# Patient Record
Sex: Female | Born: 1995 | Race: Black or African American | Hispanic: No | Marital: Single | Smoking: Current every day smoker
Health system: Southern US, Community
[De-identification: ages and names within clinical notes are randomized; demographics above are authoritative.]

## PROBLEM LIST (undated history)

## (undated) ENCOUNTER — Inpatient Hospital Stay (HOSPITAL_COMMUNITY): Payer: Self-pay

## (undated) VITALS — BP 119/79 | HR 120 | Temp 98.6°F | Resp 14 | Ht 61.22 in | Wt 189.6 lb

## (undated) VITALS — BP 111/71 | HR 105 | Temp 98.5°F | Resp 16 | Ht 61.0 in | Wt 194.0 lb

## (undated) VITALS — BP 125/84 | HR 86 | Temp 98.5°F | Resp 16 | Ht 61.0 in | Wt 186.0 lb

## (undated) DIAGNOSIS — I10 Essential (primary) hypertension: Secondary | ICD-10-CM

## (undated) DIAGNOSIS — Z0000 Encounter for general adult medical examination without abnormal findings: Secondary | ICD-10-CM

## (undated) DIAGNOSIS — R87611 Atypical squamous cells cannot exclude high grade squamous intraepithelial lesion on cytologic smear of cervix (ASC-H): Principal | ICD-10-CM

## (undated) DIAGNOSIS — N871 Moderate cervical dysplasia: Principal | ICD-10-CM

## (undated) DIAGNOSIS — Z6838 Body mass index (BMI) 38.0-38.9, adult: Secondary | ICD-10-CM

## (undated) DIAGNOSIS — F32A Depression, unspecified: Secondary | ICD-10-CM

## (undated) DIAGNOSIS — Z6221 Child in welfare custody: Secondary | ICD-10-CM

## (undated) DIAGNOSIS — T7840XA Allergy, unspecified, initial encounter: Secondary | ICD-10-CM

## (undated) DIAGNOSIS — N390 Urinary tract infection, site not specified: Secondary | ICD-10-CM

## (undated) DIAGNOSIS — F329 Major depressive disorder, single episode, unspecified: Secondary | ICD-10-CM

## (undated) DIAGNOSIS — F419 Anxiety disorder, unspecified: Secondary | ICD-10-CM

## (undated) DIAGNOSIS — F509 Eating disorder, unspecified: Secondary | ICD-10-CM

## (undated) HISTORY — DX: Urinary tract infection, site not specified: N39.0

## (undated) HISTORY — DX: Child in welfare custody: Z62.21

## (undated) HISTORY — DX: Morbid (severe) obesity due to excess calories: E66.01

---
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
## 2014-01-03 ENCOUNTER — Ambulatory Visit: Payer: Medicaid Other | Admitting: Pediatrics

## 2014-01-04 ENCOUNTER — Encounter (HOSPITAL_COMMUNITY): Payer: Self-pay | Admitting: Emergency Medicine

## 2014-01-04 ENCOUNTER — Emergency Department (HOSPITAL_COMMUNITY)
Admission: EM | Admit: 2014-01-04 | Discharge: 2014-01-04 | Disposition: A | Discharge status: Home / Self Care | Payer: Federal, State, Local not specified - PPO | Attending: Emergency Medicine | Admitting: Emergency Medicine

## 2014-01-04 DIAGNOSIS — Z87440 Personal history of urinary (tract) infections: Secondary | ICD-10-CM | POA: Diagnosis not present

## 2014-01-04 DIAGNOSIS — F329 Major depressive disorder, single episode, unspecified: Secondary | ICD-10-CM | POA: Diagnosis not present

## 2014-01-04 DIAGNOSIS — Z79899 Other long term (current) drug therapy: Secondary | ICD-10-CM | POA: Diagnosis not present

## 2014-01-04 DIAGNOSIS — A499 Bacterial infection, unspecified: Secondary | ICD-10-CM | POA: Diagnosis not present

## 2014-01-04 DIAGNOSIS — F17200 Nicotine dependence, unspecified, uncomplicated: Secondary | ICD-10-CM | POA: Insufficient documentation

## 2014-01-04 DIAGNOSIS — R109 Unspecified abdominal pain: Secondary | ICD-10-CM | POA: Diagnosis present

## 2014-01-04 DIAGNOSIS — Z3202 Encounter for pregnancy test, result negative: Secondary | ICD-10-CM | POA: Diagnosis not present

## 2014-01-04 DIAGNOSIS — F3289 Other specified depressive episodes: Secondary | ICD-10-CM | POA: Diagnosis not present

## 2014-01-04 DIAGNOSIS — B9689 Other specified bacterial agents as the cause of diseases classified elsewhere: Secondary | ICD-10-CM | POA: Diagnosis not present

## 2014-01-04 DIAGNOSIS — N760 Acute vaginitis: Secondary | ICD-10-CM | POA: Diagnosis not present

## 2014-01-04 LAB — URINALYSIS, ROUTINE W REFLEX MICROSCOPIC
GLUCOSE, UA: NEGATIVE mg/dL
HGB URINE DIPSTICK: NEGATIVE
Ketones, ur: NEGATIVE mg/dL
Nitrite: NEGATIVE
Protein, ur: NEGATIVE mg/dL
SPECIFIC GRAVITY, URINE: 1.037 — AB (ref 1.005–1.030)
Urobilinogen, UA: 1 mg/dL (ref 0.0–1.0)
pH: 5.5 (ref 5.0–8.0)

## 2014-01-04 LAB — COMPREHENSIVE METABOLIC PANEL
ALT: 19 U/L (ref 0–35)
AST: 22 U/L (ref 0–37)
Albumin: 3.7 g/dL (ref 3.5–5.2)
Alkaline Phosphatase: 87 U/L (ref 39–117)
BUN: 9 mg/dL (ref 6–23)
CALCIUM: 9.7 mg/dL (ref 8.4–10.5)
CO2: 25 mEq/L (ref 19–32)
Chloride: 100 mEq/L (ref 96–112)
Creatinine, Ser: 0.67 mg/dL (ref 0.50–1.10)
GFR calc non Af Amer: >90 mL/min (ref >90)
GLUCOSE: 101 mg/dL — AB (ref 70–99)
Potassium: 4.4 mEq/L (ref 3.7–5.3)
SODIUM: 138 meq/L (ref 137–147)
TOTAL PROTEIN: 8.1 g/dL (ref 6.0–8.3)
Total Bilirubin: 0.3 mg/dL (ref 0.3–1.2)

## 2014-01-04 LAB — CBC WITH DIFFERENTIAL/PLATELET
BASOS PCT: 0 % (ref 0–1)
Basophils Absolute: 0 10*3/uL (ref 0.0–0.1)
EOS PCT: 1 % (ref 0–5)
Eosinophils Absolute: 0.1 10*3/uL (ref 0.0–0.7)
HEMATOCRIT: 41 % (ref 36.0–46.0)
HEMOGLOBIN: 14.6 g/dL (ref 12.0–15.0)
LYMPHS PCT: 29 % (ref 12–46)
Lymphs Abs: 1.7 10*3/uL (ref 0.7–4.0)
MCH: 31.1 pg (ref 26.0–34.0)
MCHC: 35.6 g/dL (ref 30.0–36.0)
MCV: 87.2 fL (ref 78.0–100.0)
MONOS PCT: 6 % (ref 3–12)
Monocytes Absolute: 0.3 10*3/uL (ref 0.1–1.0)
NEUTROS ABS: 3.7 10*3/uL (ref 1.7–7.7)
Neutrophils Relative %: 64 % (ref 43–77)
Platelets: 185 10*3/uL (ref 150–400)
RBC: 4.7 MIL/uL (ref 3.87–5.11)
RDW: 12.7 % (ref 11.5–15.5)
WBC: 5.8 10*3/uL (ref 4.0–10.5)

## 2014-01-04 LAB — WET PREP, GENITAL
Trich, Wet Prep: NONE SEEN
YEAST WET PREP: NONE SEEN

## 2014-01-04 LAB — LIPASE, BLOOD: Lipase: 15 U/L (ref 11–59)

## 2014-01-04 LAB — URINE MICROSCOPIC-ADD ON

## 2014-01-04 MED ORDER — LIDOCAINE HCL (PF) 1 % IJ SOLN
INTRAMUSCULAR | Status: AC
  Administered 2014-01-04: 0.9 mL
  Filled 2014-01-04: qty 5

## 2014-01-04 MED ORDER — CEFTRIAXONE SODIUM 250 MG IJ SOLR
250.0000 mg | Freq: Once | INTRAMUSCULAR | Status: AC
  Administered 2014-01-04: 250 mg via INTRAMUSCULAR
  Filled 2014-01-04: qty 250

## 2014-01-04 MED ORDER — METRONIDAZOLE 500 MG PO TABS: 500.0000 mg | ORAL_TABLET | Freq: Two times a day (BID) | ORAL | Status: DC

## 2014-01-04 MED ORDER — AZITHROMYCIN 250 MG PO TABS
1000.0000 mg | ORAL_TABLET | Freq: Once | ORAL | Status: AC
  Administered 2014-01-04: 1000 mg via ORAL
  Filled 2014-01-04: qty 4

## 2014-01-04 NOTE — Discharge Instructions (Signed)
Flagyl as prescribed.  We will call you if your cultures required further treatment.  Return to the ER for severe abdominal pain, bloody stool, high fever, or other new or concerning symptoms.   Bacterial Vaginosis Bacterial vaginosis is a vaginal infection that occurs when the normal balance of bacteria in the vagina is disrupted. It results from an overgrowth of certain bacteria. This is the most common vaginal infection in women of childbearing age. Treatment is important to prevent complications, especially in pregnant women, as it can cause a premature delivery. CAUSES  Bacterial vaginosis is caused by an increase in harmful bacteria that are normally present in smaller amounts in the vagina. Several different kinds of bacteria can cause bacterial vaginosis. However, the reason that the condition develops is not fully understood. RISK FACTORS Certain activities or behaviors can put you at an increased risk of developing bacterial vaginosis, including:  Having a new sex partner or multiple sex partners.  Douching.  Using an intrauterine device (IUD) for contraception. Women do not get bacterial vaginosis from toilet seats, bedding, swimming pools, or contact with objects around them. SIGNS AND SYMPTOMS  Some women with bacterial vaginosis have no signs or symptoms. Common symptoms include:  Grey vaginal discharge.  A fishlike odor with discharge, especially after sexual intercourse.  Itching or burning of the vagina and vulva.  Burning or pain with urination. DIAGNOSIS  Your health care provider will take a medical history and examine the vagina for signs of bacterial vaginosis. A sample of vaginal fluid may be taken. Your health care provider will look at this sample under a microscope to check for bacteria and abnormal cells. A vaginal pH test may also be done.  TREATMENT  Bacterial vaginosis may be treated with antibiotic medicines. These may be given in the form of a pill or  a vaginal cream. A second round of antibiotics may be prescribed if the condition comes back after treatment.  HOME CARE INSTRUCTIONS   Only take over-the-counter or prescription medicines as directed by your health care provider.  If antibiotic medicine was prescribed, take it as directed. Make sure you finish it even if you start to feel better.  Do not have sex until treatment is completed.  Tell all sexual partners that you have a vaginal infection. They should see their health care provider and be treated if they have problems, such as a mild rash or itching.  Practice safe sex by using condoms and only having one sex partner. SEEK MEDICAL CARE IF:   Your symptoms are not improving after 3 days of treatment.  You have increased discharge or pain.  You have a fever. MAKE SURE YOU:   Understand these instructions.  Will watch your condition.  Will get help right away if you are not doing well or get worse. FOR MORE INFORMATION  Centers for Disease Control and Prevention, Division of STD Prevention: AppraiserFraud.fi American Sexual Health Association (ASHA): www.ashastd.org  Document Released: 10/27/2005 Document Revised: 08/17/2013 Document Reviewed: 06/08/2013 [facility] Patient Information 2014 Kevin.

## 2014-01-04 NOTE — ED Notes (Signed)
C/o abd pain, vomiting, headaches x 3 weeks.

## 2014-01-04 NOTE — ED Provider Notes (Signed)
CSN: [id number]     Arrival date & time 01/04/14  1417 History   First MD Initiated Contact with Patient 01/04/14 1617     Chief Complaint  Patient presents with  . Abdominal Pain     (Consider location/radiation/quality/duration/timing/severity/associated sxs/prior Treatment) HPI Comments: Patient is an 18 year old female with history of depression and UTIs. She presents today with complaints of abdominal discomfort she states is in her epigastric and suprapubic region. This has been coming and going for the past 2 weeks. It occurs several times per day and seems to last an hour or 2. It resolves on its own and there are no alleviating factors. She denies any fevers or chills. She does admit to a vaginal discharge but denies vaginal bleeding. She is sexually active with a new partner over the past month and has not used protection. Her menstrual period is now 2 weeks late.  Patient is a 18 y.o. female presenting with abdominal pain. The history is provided by the patient.  Abdominal Pain Pain location:  Epigastric and suprapubic Pain quality: cramping   Pain severity:  Moderate Onset quality:  Gradual Duration:  2 weeks Timing:  Intermittent Progression:  Worsening Chronicity:  New Relieved by:  Nothing Worsened by:  Nothing tried Ineffective treatments:  None tried Associated symptoms: vaginal discharge   Associated symptoms: no chills, no constipation, no diarrhea, no fever, no hematuria, no vaginal bleeding and no vomiting     Past Medical History  Diagnosis Date  . Depression   . Allergy   . Eating disorder   . UTI (urinary tract infection) 10/03/2013   History reviewed. No pertinent past surgical history. History reviewed. No pertinent family history. History  Substance Use Topics  . Smoking status: Light Tobacco Smoker    Types: Cigarettes  . Smokeless tobacco: Never Used     Comment: states smokes occasionally not daily  . Alcohol Use: No   OB History   Grav  Para Term Preterm Abortions TAB SAB Ect Mult Living                 Review of Systems  Constitutional: Negative for fever and chills.  Gastrointestinal: Positive for abdominal pain. Negative for vomiting, diarrhea and constipation.  Genitourinary: Positive for vaginal discharge. Negative for hematuria and vaginal bleeding.  All other systems reviewed and are negative.      Allergies  Amoxicillin and Tomato  Home Medications   Current Outpatient Rx  Name  Route  Sig  Dispense  Refill  . acetaminophen (TYLENOL) 500 MG tablet   Oral   Take 1,000 mg by mouth every 6 (six) hours as needed for moderate pain.         . ARIPiprazole (ABILIFY) 15 MG tablet   Oral   Take 0.5 tablets (7.5 mg total) by mouth at bedtime.   15 tablet   1   . FLUoxetine (PROZAC) 40 MG capsule   Oral   Take 1 capsule (40 mg total) by mouth daily.   30 capsule   1    BP 123/62  Pulse 74  Temp(Src) 98 F (36.7 C) (Oral)  Resp 18  SpO2 100%  LMP 12/27/2013 Physical Exam  Nursing note and vitals reviewed. Constitutional: She is oriented to person, place, and time. She appears well-developed and well-nourished. No distress.  HENT:  Head: Normocephalic and atraumatic.  Neck: Normal range of motion. Neck supple.  Cardiovascular: Normal rate and regular rhythm.  Exam reveals no gallop and no friction  rub.   No murmur heard. Pulmonary/Chest: Effort normal and breath sounds normal. No respiratory distress. She has no wheezes.  Abdominal: Soft. Bowel sounds are normal. She exhibits no distension and no mass. There is tenderness. There is no rebound and no guarding.  There is tenderness to palpation in the suprapubic region, and to a lesser degree the epigastric region. There is no rebound or guarding.  Musculoskeletal: Normal range of motion.  Neurological: She is alert and oriented to person, place, and time.  Skin: Skin is warm and dry. She is not diaphoretic.    ED Course  Procedures  (including critical care time) Labs Review Labs Reviewed  COMPREHENSIVE METABOLIC PANEL - Abnormal; Notable for the following:    Glucose, Bld 101 (*)    All other components within normal limits  URINALYSIS, ROUTINE W REFLEX MICROSCOPIC - Abnormal; Notable for the following:    Color, Urine AMBER (*)    APPearance CLOUDY (*)    Specific Gravity, Urine 1.037 (*)    Bilirubin Urine SMALL (*)    Leukocytes, UA TRACE (*)    All other components within normal limits  URINE MICROSCOPIC-ADD ON - Abnormal; Notable for the following:    Squamous Epithelial / LPF FEW (*)    Bacteria, UA FEW (*)    All other components within normal limits  WET PREP, GENITAL  GC/CHLAMYDIA PROBE AMP  LIPASE, BLOOD  CBC WITH DIFFERENTIAL  POC URINE PREG, ED   Imaging Review No results found.    MDM   Final diagnoses:  None    Wet prep reveals moderate clue cells and many white blood cells. Will treat as if BV pending GC and Chlamydia cultures. To return as needed if her symptoms worsen or change.    Veryl Speak, MD 01/04/14 [phone]

## 2014-01-05 LAB — GC/CHLAMYDIA PROBE AMP
CT Probe RNA: NEGATIVE
GC Probe RNA: NEGATIVE

## 2014-01-17 ENCOUNTER — Emergency Department (HOSPITAL_COMMUNITY)
Admission: EM | Admit: 2014-01-17 | Discharge: 2014-01-17 | Disposition: A | Discharge status: Other Acute Inpatient Hospital | Payer: Federal, State, Local not specified - PPO | Attending: Emergency Medicine | Admitting: Emergency Medicine

## 2014-01-17 ENCOUNTER — Encounter (HOSPITAL_COMMUNITY): Payer: Self-pay | Admitting: *Deleted

## 2014-01-17 ENCOUNTER — Encounter (HOSPITAL_COMMUNITY): Payer: Self-pay | Admitting: Emergency Medicine

## 2014-01-17 ENCOUNTER — Inpatient Hospital Stay (HOSPITAL_COMMUNITY)
Admission: RE | Admit: 2014-01-17 | Discharge: 2014-01-20 | DRG: 885 | Disposition: A | Discharge status: Home / Self Care | Payer: Federal, State, Local not specified - PPO | Attending: Psychiatry | Admitting: Psychiatry

## 2014-01-17 DIAGNOSIS — F17200 Nicotine dependence, unspecified, uncomplicated: Secondary | ICD-10-CM | POA: Diagnosis not present

## 2014-01-17 DIAGNOSIS — Z880 Allergy status to penicillin: Secondary | ICD-10-CM | POA: Insufficient documentation

## 2014-01-17 DIAGNOSIS — F1010 Alcohol abuse, uncomplicated: Secondary | ICD-10-CM | POA: Diagnosis not present

## 2014-01-17 DIAGNOSIS — R45851 Suicidal ideations: Secondary | ICD-10-CM

## 2014-01-17 DIAGNOSIS — Z87440 Personal history of urinary (tract) infections: Secondary | ICD-10-CM | POA: Diagnosis not present

## 2014-01-17 DIAGNOSIS — F3289 Other specified depressive episodes: Secondary | ICD-10-CM | POA: Insufficient documentation

## 2014-01-17 DIAGNOSIS — F332 Major depressive disorder, recurrent severe without psychotic features: Secondary | ICD-10-CM | POA: Diagnosis present

## 2014-01-17 DIAGNOSIS — Z8669 Personal history of other diseases of the nervous system and sense organs: Secondary | ICD-10-CM | POA: Diagnosis not present

## 2014-01-17 DIAGNOSIS — Z79899 Other long term (current) drug therapy: Secondary | ICD-10-CM | POA: Diagnosis not present

## 2014-01-17 DIAGNOSIS — F909 Attention-deficit hyperactivity disorder, unspecified type: Secondary | ICD-10-CM | POA: Diagnosis present

## 2014-01-17 DIAGNOSIS — F331 Major depressive disorder, recurrent, moderate: Principal | ICD-10-CM | POA: Diagnosis present

## 2014-01-17 DIAGNOSIS — F411 Generalized anxiety disorder: Secondary | ICD-10-CM | POA: Diagnosis present

## 2014-01-17 DIAGNOSIS — G4700 Insomnia, unspecified: Secondary | ICD-10-CM | POA: Diagnosis present

## 2014-01-17 DIAGNOSIS — F329 Major depressive disorder, single episode, unspecified: Secondary | ICD-10-CM | POA: Diagnosis not present

## 2014-01-17 DIAGNOSIS — F39 Unspecified mood [affective] disorder: Secondary | ICD-10-CM | POA: Diagnosis present

## 2014-01-17 DIAGNOSIS — Z008 Encounter for other general examination: Secondary | ICD-10-CM | POA: Diagnosis present

## 2014-01-17 LAB — COMPREHENSIVE METABOLIC PANEL
ALBUMIN: 4.3 g/dL (ref 3.5–5.2)
ALK PHOS: 90 U/L (ref 39–117)
ALT: 17 U/L (ref 0–35)
AST: 21 U/L (ref 0–37)
BILIRUBIN TOTAL: 0.2 mg/dL — AB (ref 0.3–1.2)
BUN: 7 mg/dL (ref 6–23)
CHLORIDE: 101 meq/L (ref 96–112)
CO2: 25 mEq/L (ref 19–32)
Calcium: 9.9 mg/dL (ref 8.4–10.5)
Creatinine, Ser: 0.6 mg/dL (ref 0.50–1.10)
GFR calc Af Amer: >90 mL/min (ref >90)
Glucose, Bld: 92 mg/dL (ref 70–99)
POTASSIUM: 4.2 meq/L (ref 3.7–5.3)
Sodium: 139 mEq/L (ref 137–147)
Total Protein: 8 g/dL (ref 6.0–8.3)

## 2014-01-17 LAB — CBC
HEMATOCRIT: 39.4 % (ref 36.0–46.0)
Hemoglobin: 14 g/dL (ref 12.0–15.0)
MCH: 30.5 pg (ref 26.0–34.0)
MCHC: 35.5 g/dL (ref 30.0–36.0)
MCV: 85.8 fL (ref 78.0–100.0)
PLATELETS: 276 10*3/uL (ref 150–400)
RBC: 4.59 MIL/uL (ref 3.87–5.11)
RDW: 12.6 % (ref 11.5–15.5)
WBC: 6.8 10*3/uL (ref 4.0–10.5)

## 2014-01-17 LAB — ACETAMINOPHEN LEVEL

## 2014-01-17 LAB — SALICYLATE LEVEL

## 2014-01-17 LAB — ETHANOL: Alcohol, Ethyl (B): <11 mg/dL (ref 0–11)

## 2014-01-17 MED ORDER — FLUOXETINE HCL 20 MG PO CAPS
40.0000 mg | ORAL_CAPSULE | Freq: Every day | ORAL | Status: DC
  Administered 2014-01-18 – 2014-01-20 (×3): 40 mg via ORAL
  Filled 2014-01-17 (×6): qty 2

## 2014-01-17 MED ORDER — LORAZEPAM 1 MG PO TABS: 1.0000 mg | ORAL_TABLET | Freq: Three times a day (TID) | ORAL | Status: DC | PRN

## 2014-01-17 MED ORDER — ACETAMINOPHEN 500 MG PO TABS: 1000.0000 mg | ORAL_TABLET | Freq: Four times a day (QID) | ORAL | Status: DC | PRN

## 2014-01-17 MED ORDER — MAGNESIUM HYDROXIDE 400 MG/5ML PO SUSP: 30.0000 mL | Freq: Every day | ORAL | Status: DC | PRN

## 2014-01-17 MED ORDER — ARIPIPRAZOLE 15 MG PO TABS
7.5000 mg | ORAL_TABLET | Freq: Every day | ORAL | Status: DC
  Filled 2014-01-17: qty 1

## 2014-01-17 MED ORDER — FLUOXETINE HCL 20 MG PO CAPS
40.0000 mg | ORAL_CAPSULE | Freq: Every day | ORAL | Status: DC
  Administered 2014-01-17: 40 mg via ORAL
  Filled 2014-01-17: qty 2

## 2014-01-17 MED ORDER — ONDANSETRON HCL 4 MG PO TABS: 4.0000 mg | ORAL_TABLET | Freq: Three times a day (TID) | ORAL | Status: DC | PRN

## 2014-01-17 MED ORDER — IBUPROFEN 200 MG PO TABS: 600.0000 mg | ORAL_TABLET | Freq: Three times a day (TID) | ORAL | Status: DC | PRN

## 2014-01-17 MED ORDER — ALUM & MAG HYDROXIDE-SIMETH 200-200-20 MG/5ML PO SUSP: 30.0000 mL | ORAL | Status: DC | PRN

## 2014-01-17 MED ORDER — NICOTINE 21 MG/24HR TD PT24: 21.0000 mg | MEDICATED_PATCH | Freq: Every day | TRANSDERMAL | Status: DC

## 2014-01-17 MED ORDER — HYDROXYZINE HCL 25 MG PO TABS
25.0000 mg | ORAL_TABLET | Freq: Four times a day (QID) | ORAL | Status: DC | PRN
  Administered 2014-01-17 – 2014-01-19 (×3): 25 mg via ORAL
  Filled 2014-01-17 (×3): qty 1

## 2014-01-17 MED ORDER — ARIPIPRAZOLE 15 MG PO TABS
7.5000 mg | ORAL_TABLET | Freq: Every day | ORAL | Status: DC
  Administered 2014-01-17 – 2014-01-19 (×3): 7.5 mg via ORAL
  Filled 2014-01-17 (×6): qty 1

## 2014-01-17 MED ORDER — ACETAMINOPHEN 325 MG PO TABS: 650.0000 mg | ORAL_TABLET | Freq: Four times a day (QID) | ORAL | Status: DC | PRN

## 2014-01-17 MED ORDER — ZOLPIDEM TARTRATE 5 MG PO TABS: 5.0000 mg | ORAL_TABLET | Freq: Every evening | ORAL | Status: DC | PRN

## 2014-01-17 NOTE — ED Notes (Signed)
No acute distress noted at discharge time.
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
## 2014-01-17 NOTE — Progress Notes (Signed)
Writer informed the Charge Nurse that the patient would be coming to the ER for medical clearance.  Writer informed the TTS Counselor located at [facility])   Patient was assessed at [facility].  Per, Mickel Baas NP the patient meets criteria for inpatient hospitalization for a 500 Hall Bed.  However, no beds at [facility].  Writer contacted [place].

## 2014-01-17 NOTE — Consult Note (Signed)
  Patient was a walk in from [facility]; Patient accepted to [facility] and sent to [facility] for medical clearance.  UDS needs to be collected prior to transfer to [facility].  Patient has been medically cleared.  Patient endorses suicidal ideation with out a plan.  Agree with the TTS assessment for inpatient treatment.    Home medications started.  Monitor for safety and stabilization until patient is transferred to [place] [facility]  Deanna Valencia B. Tylesha Gibeault FNP-BC
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
## 2014-01-17 NOTE — Consult Note (Signed)
Face to face interview and I agree with this note

## 2014-01-17 NOTE — Tx Team (Signed)
Initial Interdisciplinary Treatment Plan  PATIENT STRENGTHS: (choose at least two) Ability for insight Average or above average intelligence Capable of independent living Communication skills General fund of knowledge Motivation for treatment/growth Supportive family/friends  PATIENT STRESSORS: Financial difficulties Medication change or noncompliance Occupational concerns   PROBLEM LIST: Problem List/Patient Goals Date to be addressed Date deferred Reason deferred Estimated date of resolution  Depression      Non-compliant with meds("forget to take them")- has not had med in about 4 weeks      homeless      Risk for self harm                                     DISCHARGE CRITERIA:  Ability to meet basic life and health needs Adequate post-discharge living arrangements Improved stabilization in mood, thinking, and/or behavior Motivation to continue treatment in a less acute level of care Verbal commitment to aftercare and medication compliance  PRELIMINARY DISCHARGE PLAN: Attend aftercare/continuing care group Attend PHP/IOP Outpatient therapy Placement in alternative living arrangements  PATIENT/FAMIILY INVOLVEMENT: This treatment plan has been presented to and reviewed with the patient, Deanna Valencia, and/or family member.  The patient and family have been given the opportunity to ask questions and make suggestions.  Junius Finner [facility] 01/17/2014, 10:48 PM

## 2014-01-17 NOTE — Discharge Instructions (Signed)
Bipolar Disorder Bipolar disorder is a mental illness. The term bipolar disorder actually is used to describe a group of disorders that all share varying degrees of emotional highs and lows that can interfere with daily functioning, such as work, school, or relationships. Bipolar disorder also can lead to drug abuse, hospitalization, and suicide. The emotional highs of bipolar disorder are periods of elation or irritability and high energy. These highs can range from a mild form (hypomania) to a severe form (mania). People experiencing episodes of hypomania may appear energetic, excitable, and highly productive. People experiencing mania may behave impulsively or erratically. They often make poor decisions. They may have difficulty sleeping. The most severe episodes of mania can involve having very distorted beliefs or perceptions about the world and seeing or hearing things that are not real (psychotic delusions and hallucinations).  The emotional lows of bipolar disorder (depression) also can range from mild to severe. Severe episodes of bipolar depression can involve psychotic delusions and hallucinations. Sometimes people with bipolar disorder experience a state of mixed mood. Symptoms of hypomania or mania and depression are both present during this mixed-mood episode. SIGNS AND SYMPTOMS There are signs and symptoms of the episodes of hypomania and mania as well as the episodes of depression. The signs and symptoms of hypomania and mania are similar but vary in severity. They include:  Inflated self-esteem or feeling of increased self-confidence.  Decreased need for sleep.  Unusual talkativeness (rapid or pressured speech) or the feeling of a need to keep talking.  Sensation of racing thoughts or constant talking, with quick shifts between topics that may or may not be related (flight of ideas).  Decreased ability to focus or concentrate.  Increased purposeful activity, such as work, studies,  or social activity, or nonproductive activity, such as pacing, squirming and fidgeting, or finger and toe tapping.  Impulsive behavior and use of poor judgment, resulting in high-risk activities, such as having unprotected sex or spending excessive amounts of money. Signs and symptoms of depression include the following:   Feelings of sadness, hopelessness, or helplessness.  Frequent or uncontrollable episodes of crying.  Lack of feeling anything or caring about anything.  Difficulty sleeping or sleeping too much.  Inability to enjoy the things you used to enjoy.   Desire to be alone all the time.   Feelings of guilt or worthlessness.  Lack of energy or motivation.   Difficulty concentrating, remembering, or making decisions.  Change in appetite or weight beyond normal fluctuations.  Thoughts of death or the desire to harm yourself. DIAGNOSIS  Bipolar disorder is diagnosed through an assessment by your caregiver. Your caregiver will ask questions about your emotional episodes. There are two main types of bipolar disorder. People with type I bipolar disorder have manic episodes with or without depressive episodes. People with type II bipolar disorder have hypomanic episodes and major depressive episodes, which are more serious than mild depression. The type of bipolar disorder you have can make an important difference in how your illness is monitored and treated. Your caregiver may ask questions about your medical history and use of alcohol or drugs, including prescription medication. Certain medical conditions and substances also can cause emotional highs and lows that resemble bipolar disorder (secondary bipolar disorder).  TREATMENT  Bipolar disorder is a long-term illness. It is best controlled with continuous treatment rather than treatment only when symptoms occur. The following treatments can be prescribed for bipolar disorders:  Medication Medication can be prescribed by  a doctor  that is an expert in treating mental disorders (psychiatrists). Medications called mood stabilizers are usually prescribed to help control the illness. Other medications are sometimes added if symptoms of mania, depression, or psychotic delusions and hallucinations occur despite the use of a mood stabilizer.  Talk therapy Some forms of talk therapy are helpful in providing support, education, and guidance. A combination of medication and talk therapy is best for managing the disorder over time. A procedure in which electricity is applied to your brain through your scalp (electroconvulsive therapy) is used in cases of severe mania when medication and talk therapy do not work or work too slowly. Document Released: 02/02/2001 Document Revised: 02/21/2013 Document Reviewed: 11/22/2012 [facility] Patient Information 2014 [place], [place].  Major Depressive Disorder Major depressive disorder (MDD) is a mental illness. It also may be called clinical depression or unipolar depression. MDD usually causes feelings of sadness, hopelessness, or helplessness. Some people with MDD do not feel particularly sad but lose interest in doing things they used to enjoy (anhedonia). MDD also can cause physical symptoms. It can interfere with work, school, relationships, and other normal everyday activities. MDD varies in severity but is longer lasting and more serious than the sadness we all feel from time to time in our lives. MDD often is triggered by stressful life events or major life changes. Examples of these triggers include divorce, loss of your job or home, a move, and the death of a family member or close friend. Sometimes MDD occurs for no obvious reason at all. People who have family members with MDD or bipolar disorder are at higher risk for developing MDD, with or without life stressors. MDD can occur at any age. It may occur just once in your life (single episode MDD). It may occur multiple times (recurrent  MDD). SYMPTOMS People with MDD have either anhedonia or depressed mood on nearly a daily basis for at least 2 weeks or longer. Symptoms of depressed mood include:  Feelings of sadness (blue or down in the dumps) or emptiness.  Feelings of hopelessness or helplessness.  Tearfulness or episodes of crying (may be observed by others).  Irritability (children and adolescents). In addition to depressed mood or anhedonia or both, people with MDD have at least four of the following symptoms:  Difficulty sleeping or sleeping too much.   Significant change (increase or decrease) in appetite or weight.   Lack of energy or motivation.  Feelings of guilt and worthlessness.   Difficulty concentrating, remembering, or making decisions.  Unusually slow movement (psychomotor retardation) or restlessness (as observed by others).   Recurrent wishes for death, recurrent thoughts of self-harm (suicide), or a suicide attempt. People with MDD commonly have persistent negative thoughts about themselves, other people, and the world. People with severe MDD may experiencedistorted beliefs or perceptions about the world (psychotic delusions). They also may see or hear things that are not real (psychotic hallucinations). DIAGNOSIS MDD is diagnosed through an assessment by your caregiver. Your caregiver will ask aboutaspects of your daily life, such as mood,sleep, and appetite, to see if you have the diagnostic symptoms of MDD. Your caregiver may ask about your medical history and use of alcohol or drugs, including prescription medications. Your caregiver also may do a physical exam and blood work. This is because certain medical conditions and the use of certain substances can cause MDD-like symptoms (secondary depression). Your caregiver also may refer you to a mental health specialist for further evaluation and treatment. TREATMENT It is important to recognize  the symptoms of MDD and seek treatment. The  following treatments can be prescribed for MDD:   Medication Antidepressant medications usually are prescribed. Antidepressant medications are thought to correct chemical imbalances in the brain that are commonly associated with MDD. Other types of medication may be added if MDD symptoms do not respond to antidepressant medications alone or if psychotic delusions or hallucinations occur.  Talk therapy Talk therapy can be helpful in treating MDD by providing support, education, and guidance. Certain types of talk therapy also can help with negative thinking (cognitive behavioral therapy) and with relationship issues that trigger MDD (interpersonal therapy). A mental health specialist can help determine which treatment is best for you. Most people with MDD do well with a combination of medication and talk therapy. Treatments involving electrical stimulation of the brain can be used in situations with extremely severe symptoms or when medication and talk therapy do not work over time. These treatments include electroconvulsive therapy, transcranial magnetic stimulation, and vagal nerve stimulation. Document Released: 02/21/2013 Document Reviewed: 02/21/2013 [facility] Patient Information 2014 [place], [place].  Borderline Personality Disorder Borderline personality disorder is a mental health disorder. People with borderline personality disorder have unhealthy patterns of perceiving, thinking about, and reacting to their environment and events in their life. These patterns are established by adolescence or early adulthood. People with borderline personality disorder also have difficulty coping with stress on their own and fear being abandoned by others. They have difficulty controlling their emotions. Their emotions change quickly, frequently, and intensely. They are easily upset and can become very angry, very suddenly. Their unpredictable behavior often leads to problems in their relationships. They often  feel worthless, unloved, and emotionally empty. CAUSES No one knows the exact cause of borderline personality disorder. Most mental health experts think that there is more than one cause. Possible contributing factors include:  Genetic factors. These are traits that are passed down from one generation to the next. Many people with borderline personality disorder have a family history of the disorder.  Physical factors. The part of the brain that controls emotion may be different in people who have borderline personality disorder.  Social factors. Traumatic experiences involving other people may play a role in the development of borderline personality disorder. Examples include neglect, abandonment, and physical and sexual abuse. SYMPTOMS Signs and symptoms of borderline personality disorder include:  A series of unstable personal relationships.  A strong fear of being abandoned and frantic efforts to avoid abandonment.  Impulsive, self-destructive behavior, such as substance abuse, irrational spending of money, unprotected sex with multiple partners, reckless driving, and binge eating.  Poor self-image that also may change a lot, or a sense of identity that is inconsistent.  Recurring self-injury or attempted suicide.  Severe mood swings, including depression, irritability, and anxiety.  Lasting feelings of emptiness.  Difficulty controlling anger.  Temporary feelings of paranoia or loss of touch with reality. DIAGNOSIS A diagnosis of borderline personality disorder requires the presence of at least 5 of the common signs and symptoms. This information is gathered from family and friends as well as medical professionals and legal professionals who have a close association with the patient. This information is also gathered during a psychiatric assessment. During the assessment, the patient is asked about early life experiences, level of education, employment status, physical health  conditions, and current prescription and over-the-counter medicines used. TREATMENT Caregivers who usually treat borderline personality disorder are mental health professionals, such as psychologists, psychiatrists, and clinical social workers. More than one  type of treatment may be needed. Types of treatment include:  Psychotherapy (also known as talk therapy or counseling).  Cognitive behavioral therapy. This helps the person to recognize and change unhealthy feelings, thoughts, and behaviors. They find new, more positive thoughts and actions to replace the old ones.  Dialectical behavioral therapy. Through this type of treatment, a person learns to understand his or her feelings and to regulate them. This may be one-on-one treatment or part of group therapy.  Family therapy. This treatment includes family members.  Medicine. Medicine may be used to help control emotions, reduce reckless and self-destructive behavior, treat anxiety, and treat depression. SEEK IMMEDIATE MEDICAL CARE IF:   You cannot control your behavior or emotions.  You think about hurting yourself.  You think about suicide. FOR MORE INFORMATION National Alliance on Mental Illness: www.nami.[place]: https://carter.com/ [place]: http://bpdresourcecenter.org Document Released: 02/11/2011 Document Revised: 01/19/2012 Document Reviewed: 02/11/2011 [facility] Patient Information 2014 [place], [place].  Depression, Adult Depression is feeling sad, low, down in the dumps, blue, gloomy, or empty. In general, there are two kinds of depression:  Normal sadness or grief. This can happen after something upsetting. It often goes away on its own within 2 weeks. After losing a loved one (bereavement), normal sadness and grief may last longer than two weeks. It usually gets better with time.  Clinical depression. This kind lasts longer than normal sadness or grief. It  keeps you from doing the things you normally do in life. It is often hard to function at home, work, or at school. It may affect your relationships with others. Treatment is often needed. GET HELP RIGHT AWAY IF:  You have thoughts about hurting yourself or others.  You lose touch with reality (psychotic symptoms). You may:  See or hear things that are not real.  Have untrue beliefs about your life or people around you.  Your medicine is giving you problems. MAKE SURE YOU:  Understand these instructions.  Will watch your condition.  Will get help right away if you are not doing well or get worse. Document Released: 11/29/2010 Document Revised: 07/21/2012 Document Reviewed: 02/26/2012 [facility] Patient Information 2014 [place], [place].  Mood Disorders Mood disorders are conditions that affect the way a person feels emotionally. The main mood disorders include:  Depression.  Bipolar disorder.  Dysthymia. Dysthymia is a mild, lasting (chronic) depression. Symptoms of dysthymia are similar to depression, but not as severe.  Cyclothymia. Cyclothymia includes mood swings, but the highs and lows are not as severe as they are in bipolar disorder. Symptoms of cyclothymia are similar to those of bipolar disorder, but less extreme. CAUSES  Mood disorders are probably caused by a combination of factors. People with mood disorders seem to have physical and chemical changes in their brains. Mood disorders run in families, so there may be genetic causes. Severe trauma or stressful life events may also increase the risk of mood disorders.  SYMPTOMS  Symptoms of mood disorders depend on the specific type of condition. Depression symptoms include:  Feeling sad, worthless, or hopeless.  Negative thoughts.  Inability to enjoy one's usual activities.  Low energy.  Sleeping too much or too little.  Appetite changes.  Crying.  Concentration problems.  Thoughts of harming  oneself. Bipolar disorder symptoms include:  Periods of depression (see above symptoms).  Mood swings, from sadness and depression, to abnormal elation and excitement.  Periods of mania:  Racing thoughts.  Fast speech.  Poor  judgment, and careless, dangerous choices.  Decreased need for sleep.  Risky behavior.  Difficulty concentrating.  Irritability.  Increased energy.  Increased sex drive. DIAGNOSIS  There are no blood tests or X-rays that can confirm a mood disorder. However, your caregiver may choose to run some tests to make sure that there is not another physical cause for your symptoms. A mood disorder is usually diagnosed after an in-depth interview with a caregiver. TREATMENT  Mood disorders can be treated with one or more of the following:  Medicine. This may include antidepressants, mood-stabilizers, or anti-psychotics.  Psychotherapy (talk therapy).  Cognitive behavioral therapy. You are taught to recognize negative thoughts and behavior patterns, and replace them with healthy thoughts and behaviors.  Electroconvulsive therapy. For very severe cases of deep depression, a series of treatments in which an electrical current is applied to the brain.  Vagus nerve stimulation. A pulse of electricity is applied to a portion of the brain.  Transcranial magnetic stimulation. Powerful magnets are placed on the head that produce electrical currents.  Hospitalization. In severe situations, or when someone is having serious thoughts of harming him or herself, hospitalization may be necessary in order to keep the person safe. This is also done to quickly start and monitor treatment. HOME CARE INSTRUCTIONS   Take your medicine exactly as directed.  Attend all of your therapy sessions.  Try to eat regular, healthy meals.  Exercise daily. Exercise may improve mood symptoms.  Get good sleep.  Do not drink alcohol or use pot or other drugs. These can worsen mood  symptoms and cause anxiety and psychosis.  Tell your caregiver if you develop any side effects, such as feeling sick to your stomach (nauseous), dry mouth, dizziness, constipation, drowsiness, tremor, weight gain, or sexual symptoms. He or she may suggest things you can do to improve symptoms.  Learn ways to cope with the stress of having a chronic illness. This includes yoga, meditation, tai chi, or participating in a support group.  Drink enough water to keep your urine clear or pale yellow. Eat a high-fiber diet. These habits may help you avoid constipation from your medicine. SEEK IMMEDIATE MEDICAL CARE IF:  Your mood worsens.  You have thoughts of hurting yourself or others.  You cannot care for yourself.  You develop the sensation of hearing or seeing something that is not actually present (auditory or visual hallucinations).  You develop abnormal thoughts. Document Released: 08/24/2009 Document Revised: 01/19/2012 Document Reviewed: 08/24/2009 [facility] Patient Information 2014 [place], [place].

## 2014-01-17 NOTE — Progress Notes (Signed)
Patient referred to [facility] and [facility]/
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
## 2014-01-17 NOTE — ED Notes (Signed)
Pt states that she has had SI "forever" but I have been on my own since Jan 2015 and met her boyfriend in Feb which wasn't good and now pt SI is severe. Pt states that she has tried to OD on pills before and that she could do it again.  Pt brought in by [facility] due to not having any beds.

## 2014-01-18 DIAGNOSIS — R45851 Suicidal ideations: Secondary | ICD-10-CM

## 2014-01-18 LAB — TSH: TSH: 0.412 u[IU]/mL (ref 0.350–4.500)

## 2014-01-18 NOTE — [facility] Group Notes (Signed)
[facility] LCSW Group Therapy  01/18/2014  1:15 PM   Type of Therapy:  Group Therapy  Participation Level:  Active  Participation Quality:  Appropriate, Sharing and Supportive  Affect:  Depressed and Flat  Cognitive:  Alert and Oriented  Insight:  Developing/Improving and Engaged  Engagement in Therapy:  Developing/Improving and Engaged  Modes of Intervention:  Clarification, Confrontation, Discussion, Education, Exploration, Limit-setting, Orientation, Problem-solving, Rapport Building, Art therapist, Socialization and Support  Summary of Progress/Problems: The topic for group today was emotional regulation.  This group focused on both positive and negative emotion identification and allowed group members to process ways to identify feelings, regulate negative emotions, and find healthy ways to manage internal/external emotions. Group members were asked to reflect on a time when their reaction to an emotion led to a negative outcome and explored how alternative responses using emotion regulation would have benefited them. Group members were also asked to discuss a time when emotion regulation was utilized when a negative emotion was experienced.  Pt shared hat she cuts to cope with emotions of anger and sadness, but feels guilty after cutting.  Pt states that she has tried to talk to her mom about why she feels this way but feels the conversation never goes well, ending in arguments and fights.  Pt states that she plans to go to therapy to continue to address her depression.  Pt actively participated and was engaged in group discussion.    Regan Lemming, LCSW 01/18/2014 2:42 PM

## 2014-01-18 NOTE — Progress Notes (Signed)
D: Patient denies SI/HI and A/V hallucinations; patient reports sleep is well; reports appetite is good; reports energy level is normal; reports ability to pay attention is improving; rates depression as 3/10; rates hopelessness 1/10;  A: Monitored q 15 minutes; patient encouraged to attend groups; patient educated about medications; patient given medications per physician orders; patient encouraged to express feelings and/or concerns  R: Patient is flat and blunted; patient is sad; patient is appropriate;patient's interaction with staff and peers is minimal; patient was able to set goal to talk with staff 1:1 when having feelings of SI; patient is taking medications as prescribed and tolerating medications; patient is attending some groups bur forwards little information

## 2014-01-18 NOTE — [facility] Group Notes (Signed)
[facility] LCSW Aftercare Discharge Planning Group Note   01/18/2014 8:45 AM  Participation Quality:  Alert, Appropriate and Oriented  Mood/Affect:  Flat and Depressed  Depression Rating:  3  Anxiety Rating:  6  Thoughts of Suicide:  Pt denies SI/HI  Will you contract for safety?   Yes  Current AVH:  Pt denies  Plan for Discharge/Comments:  Pt attended discharge planning group and actively participated in group.  CSW provided pt with today's workbook.  Pt reports coming to the hospital for SI.  Pt states that she was put out of her student housing on Monday and will have to drop out of her classes at [employer] school.  Pt states that she doesn't know where she will go upon d/c.  Pt states that she has family in [place] but can't stay with them.  CSW will talk with pt about housing options.  Pt states that she can return to [facility] for outpatient medication management and therapy.  No further needs voiced by pt at this time.    Transportation Means: Pt denies access to transportation  Supports: No supports mentioned at this time  Regan Lemming, LCSW 01/18/2014 10:16 AM
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
## 2014-01-18 NOTE — Progress Notes (Signed)
[name]:  (Nursing/MHT/Case Management/Adjunct)  Date:  01/18/2014  Time:  8:00 p.m.   Type of Therapy:  Psychoeducational Skills  Participation Level:  Active  Participation Quality:  Appropriate  Affect:  Appropriate  Cognitive:  Appropriate  Insight:  Appropriate  Engagement in Group:  Engaged  Modes of Intervention:  Education  Summary of Progress/Problems: The patient verbalized in group that she had a good day overall. For one, she indicated that she had a good visit with her father. Secondly, she mentioned that she will be going home on Friday. As a theme for the day, her personal development will involve helping herself before she helps others.    Archie Balboa S 01/18/2014, 10:06 PM

## 2014-01-18 NOTE — Progress Notes (Signed)
Pt reports she has had a good day.  She denies SI/HI/AV.  She says she has attended groups.  She says she has decided to move to [place] to be close to her mother.  She said she was going to [place] to stay with a friend, but that is not going to work out.  She has been on the phone a lot during the day.  She says she is glad to be back on her medications.  Pt makes her needs known to staff.  Support and encouragement offered.  Safety maintained with q15 minute checks.

## 2014-01-18 NOTE — [facility] Suicide Risk Assessment (Signed)
   Nursing information obtained from:  Patient Demographic factors:  Adolescent or young adult;Low socioeconomic status;Living alone;Unemployed Current Mental Status:  Self-harm behaviors Loss Factors:  Financial problems / change in socioeconomic status Historical Factors:  Prior suicide attempts;Family history of mental illness or substance abuse Risk Reduction Factors:  Positive social support;Positive coping skills or problem solving skills Total Time spent with patient: 45 minutes  CLINICAL FACTORS:   Severe Anxiety and/or Agitation Depression:   Anhedonia Hopelessness Impulsivity Insomnia Recent sense of peace/wellbeing Severe More than one psychiatric diagnosis Unstable or Poor Therapeutic Relationship Previous Psychiatric Diagnoses and Treatments Medical Diagnoses and Treatments/Surgeries  Psychiatric Specialty Exam: Physical Exam Full physical performed in Emergency Department. I have reviewed this assessment and concur with its findings.   Review of Systems  Gastrointestinal: Negative.   Psychiatric/Behavioral: Positive for depression and suicidal ideas. The patient is nervous/anxious and has insomnia.   All other systems reviewed and are negative.    Blood pressure 100/68, pulse 71, temperature 97 F (36.1 C), temperature source Oral, resp. rate 18, height 5\' 1"  (1.549 m), weight 84.369 kg (186 lb), last menstrual period 01/09/2014, SpO2 98.00%.Body mass index is 35.16 kg/(m^2).  General Appearance: Guarded  Eye Contact::  Fair  Speech:  Clear and Coherent and Slow  Volume:  Normal  Mood:  Anxious, Depressed, Hopeless, Irritable and Worthless  Affect:  Depressed and Flat  Thought Process:  Goal Directed and Intact  Orientation:  Full (Time, Place, and Person)  Thought Content:  Rumination  Suicidal Thoughts:  Yes.  without intent/plan  Homicidal Thoughts:  No  Memory:  Immediate;   Fair  Judgement:  Impaired  Insight:  Lacking  Psychomotor Activity:   Psychomotor Retardation  Concentration:  Fair  Recall:  [place]  Language: Fair  Akathisia:  NA  Handed:  Right  AIMS (if indicated):     Assets:  Communication Skills Desire for Improvement Leisure Time Physical Health Resilience Social Support  Sleep:  Number of Hours: 6.5   Musculoskeletal: Strength & Muscle Tone: within normal limits Gait & Station: normal Patient leans: N/A  COGNITIVE FEATURES THAT CONTRIBUTE TO RISK:  Closed-mindedness Loss of executive function Polarized thinking Thought constriction (tunnel vision)    SUICIDE RISK:   Moderate:  Frequent suicidal ideation with limited intensity, and duration, some specificity in terms of plans, no associated intent, good self-control, limited dysphoria/symptomatology, some risk factors present, and identifiable protective factors, including available and accessible social support.  PLAN OF CARE: Admit for crisis stabilization, safety monitoring and medication management of major depressive disorder, recurrent with suicidal ideation and in noncompliant with medication. Patient also has multiple psychosocial stressors including being homeless recently  I certify that inpatient services furnished can reasonably be expected to improve the patient's condition.  Hila Bolding,[name] 01/18/2014, 1:06 PM

## 2014-01-18 NOTE — Tx Team (Signed)
Interdisciplinary Treatment Plan Update (Adult)  Date: 01/18/2014  Time Reviewed:  9:45 AM  Progress in Treatment: Attending groups: Yes Participating in groups:  Yes Taking medication as prescribed:  Yes Tolerating medication:  Yes Family/Significant othe contact made: CSW assessing  Patient understands diagnosis:  Yes Discussing patient identified problems/goals with staff:  Yes Medical problems stabilized or resolved:  Yes Denies suicidal/homicidal ideation: Yes Issues/concerns per patient self-inventory:  Yes Other:  New problem(s) identified: N/A  Discharge Plan or Barriers: CSW assessing for appropriate referrals.  Reason for Continuation of Hospitalization: Anxiety Depression Medication Stabilization  Comments: N/A  Estimated length of stay: 3-5 days  For review of initial/current patient goals, please see plan of care.  Attendees: Patient:     Family:     Physician:  Dr. Zorita Pang 01/18/2014 10:40 AM   Nursing:   Para March, RN 01/18/2014 10:40 AM   Clinical Social Worker:  Regan Lemming, LCSW 01/18/2014 10:40 AM   Other: Catalina Pizza, PA 01/18/2014 10:40 AM   Other:  Gala Romney, care coordination 01/18/2014 10:40 AM   Other:  Joette Catching, LCSW 01/18/2014 10:40 AM   Other:  Lawernce Pitts, RN 01/18/2014 10:40 AM   Other: Lars Pinks, RN 01/18/2014 10:40 AM   Other:    Other:    Other:    Other:    Other:     Scribe for Treatment Team:   Ane Payment, 01/18/2014 10:40 AM

## 2014-01-18 NOTE — Progress Notes (Signed)
Adult Psychoeducational Group Note  Date:  01/18/2014 Time:  10:00am Group Topic/Focus:  Personal Choices and Values:   The focus of this group is to help patients assess and explore the importance of values in their lives, how their values affect their decisions, how they express their values and what opposes their expression.  Participation Level:  Active  Participation Quality:  Appropriate and Attentive  Affect:  Appropriate  Cognitive:  Alert and Appropriate  Insight: Appropriate  Engagement in Group:  Engaged  Modes of Intervention:  Discussion and Education  Additional Comments: Pt attended and participated in group. Discussion was on personal development and what it means to you. Pt stated personal development means working on self.   Marlowe Shores D 01/18/2014, 10:57 AM
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
## 2014-01-19 DIAGNOSIS — F332 Major depressive disorder, recurrent severe without psychotic features: Secondary | ICD-10-CM

## 2014-01-19 DIAGNOSIS — F411 Generalized anxiety disorder: Secondary | ICD-10-CM

## 2014-01-19 NOTE — [facility] Suicide Risk Assessment (Signed)
[place] INPATIENT:  Family/Significant Other Suicide Prevention Education  Suicide Prevention Education:  Education Completed; Sibyl Mikula - father [phone]),  (name of family member/significant other) has been identified by the patient as the family member/significant other with whom the patient will be residing, and identified as the person(s) who will aid the patient in the event of a mental health crisis (suicidal ideations/suicide attempt).  With written consent from the patient, the family member/significant other has been provided the following suicide prevention education, prior to the and/or following the discharge of the patient.  The suicide prevention education provided includes the following:  Suicide risk factors  Suicide prevention and interventions  National Suicide Hotline telephone number  [facility] assessment telephone number  [facility] Emergency Assistance [place] and/or Residential Mobile Crisis Unit telephone number  Request made of family/significant other to:  Remove weapons (e.g., guns, rifles, knives), all items previously/currently identified as safety concern.    Remove drugs/medications (over-the-counter, prescriptions, illicit drugs), all items previously/currently identified as a safety concern.  The family member/significant other verbalizes understanding of the suicide prevention education information provided.  The family member/significant other agrees to remove the items of safety concern listed above.  Father is concerned about pt's behaviors outside of the hospital, as she continues to put herself in bad situations.    Ane Payment 01/19/2014, 3:04 PM

## 2014-01-19 NOTE — Progress Notes (Signed)
D: Patient denies SI/HI/AVH. She has a flat affect and depressed mood.  She is attending  and participating in groups.  She is pleasant and appropriate with staff on the unit.  A: Patient given emotional support from RN. Patient encouraged to come to staff with concerns and/or questions. Patient's medication routine continued. Patient's orders and plan of care reviewed.   R: Patient remains appropriate and cooperative. Will continue to monitor patient q15 minutes for safety.

## 2014-01-19 NOTE — Progress Notes (Signed)
Patient ID: Deanna Valencia, female   DOB: 06/01/1996, 18 y.o.   MRN: [id number] D: Patient stated she is doing well. Pt rated depression as 2 and anxiety as 5 on a 0-10 scale. Pt stated waiting to "stay with a friend for a month till she gets a job". Pt denies SI/HI/AVH and pain. Pt attended karaoke and participated. Pt denies any needs or concerns. Cooperative with assessment. No acute distressed noted at this time.   A: Met with pt 1:1. Medications administered as prescribed. Writer encouraged pt to discuss feelings. Pt encouraged to come to staff with any questions or concerns.   R: Patient is safe on the unit. She is complaint with medications and denies any adverse reaction. Continue current POC.

## 2014-01-19 NOTE — [facility] Group Notes (Signed)
[place] LCSW Group Therapy  01/19/2014  1:15 PM   Type of Therapy:  Group Therapy  Participation Level:  Active  Participation Quality:  Attentive, Sharing and Supportive  Affect:  Calm  Cognitive:  Alert and Oriented  Insight:  Developing/Improving and Engaged  Engagement in Therapy:  Developing/Improving and Engaged  Modes of Intervention:  Clarification, Confrontation, Discussion, Education, Exploration, Limit-setting, Orientation, Problem-solving, Rapport Building, Art therapist, Socialization and Support  Summary of Progress/Problems: The topic for group was balance in life.  Today's group focused on defining balance in one's own words, identifying things that can knock one off balance, and exploring healthy ways to maintain balance in life. Group members were asked to provide an example of a time when they felt off balance, describe how they handled that situation,and process healthier ways to regain balance in the future. Group members were asked to share the most important tool for maintaining balance that they learned while at [facility] and how they plan to apply this method after discharge.  Pt shared that she was being too kind to people, which led to her getting evicted from her apartment, now leaving her homeless.  Pt states that she needs to focus more on herself even though she feels selfish doing so.  Pt actively participated and was engaged in group discussion.    Regan Lemming, LCSW 01/19/2014  2:45 PM

## 2014-01-19 NOTE — Progress Notes (Signed)
Adult Psychoeducational Group Note  Date:  01/19/2014 Time:  10:23 AM  Group Topic/Focus:  Self Esteem Action Plan:   The focus of this group is to help patients create a plan to continue to build self-esteem after discharge.  Participation Level:  Active  Participation Quality:  Appropriate, Attentive, Sharing and Supportive  Affect:  Appropriate  Cognitive:  Appropriate and Oriented  Insight: Appropriate  Engagement in Group:  Engaged and Supportive  Modes of Intervention:  Activity, Discussion and Socialization  Additional Comments:  Pt stated on thing she loves most about herself is that she can "turn around any situation and make people laugh".   Deanna Valencia N 01/19/2014, 10:23 AM
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
## 2014-01-19 NOTE — Progress Notes (Signed)
The focus of this group is to educate the patient on the purpose and policies of crisis stabilization and provide a format to answer questions about their admission.  The group details unit policies and expectations of patients while admitted. Pt. Attended group regarding nurse education.  Pt. Actively participated, affect was appropriate.  Pt. Alert with appropriate insight while actively engaging.  Pt. Will work on 3 goals for after their discharge.

## 2014-01-20 DIAGNOSIS — F331 Major depressive disorder, recurrent, moderate: Principal | ICD-10-CM

## 2014-01-20 MED ORDER — ALUM & MAG HYDROXIDE-SIMETH 200-200-20 MG/5ML PO SUSP: 30.0000 mL | ORAL | Status: DC | PRN

## 2014-01-20 MED ORDER — FLUOXETINE HCL 40 MG PO CAPS: 40.0000 mg | ORAL_CAPSULE | Freq: Every day | ORAL | Status: DC

## 2014-01-20 MED ORDER — HYDROXYZINE HCL 25 MG PO TABS: 25.0000 mg | ORAL_TABLET | Freq: Four times a day (QID) | ORAL | Status: DC | PRN

## 2014-01-20 MED ORDER — ARIPIPRAZOLE 15 MG PO TABS: 7.5000 mg | ORAL_TABLET | Freq: Every day | ORAL | Status: DC

## 2014-01-20 NOTE — Progress Notes (Signed)
MD completes DC order and DC SRA. Pt deneid SI, audit, vis and tactile halluc. Pt goven DC AVS, stated she understood these instructions and will comply. SHe completed her morning self inventory and on it she wrote she denies SI within the past 24 hrs, she rates her depression  And hopelessness  "2/2"  And writes her DC plan is to " worry more about ehrslef and less about others". All belongings are returned to pt and she was then escorted to bldg entrance and [place] per poc.

## 2014-01-20 NOTE — [facility] Group Notes (Signed)
[place] LCSW Group Therapy  01/20/2014  1:15 PM   Type of Therapy:  Group Therapy  Participation Level:  Active  Participation Quality:  Attentive, Sharing and Supportive  Affect:  Calm  Cognitive:  Alert and Oriented  Insight:  Developing/Improving and Engaged  Engagement in Therapy:  Developing/Improving and Engaged  Modes of Intervention:  Clarification, Confrontation, Discussion, Education, Exploration, Limit-setting, Orientation, Problem-solving, Rapport Building, Art therapist, Socialization and Support  Summary of Progress/Problems: The topic for today was feelings about relapse.  Pt discussed what relapse prevention is to them and identified triggers that they are on the path to relapse.  Pt processed their feeling towards relapse and was able to relate to peers.  Pt discussed coping skills that can be used for relapse prevention.  Pt states that she relapsed with cutting numerous times but feels she won't ever cut again.  Pt states that her motivation is her little sister.  Pt discussed getting back into therapy to have someone to talk to.  Pt actively participated and was engaged in group discussion.    Regan Lemming, LCSW 01/20/2014  2:35 PM
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
## 2014-01-20 NOTE — [facility] Suicide Risk Assessment (Signed)
   Demographic Factors:  Adolescent or young adult, Low socioeconomic status and Unemployed  Total Time spent with patient: 30 minutes  Psychiatric Specialty Exam: Physical Exam  ROS  Blood pressure 125/84, pulse 86, temperature 98.5 F (36.9 C), temperature source Oral, resp. rate 16, height 5\' 1"  (1.549 m), weight 84.369 kg (186 lb), last menstrual period 01/09/2014, SpO2 98.00%.Body mass index is 35.16 kg/(m^2).  General Appearance: Casual and Fairly Groomed  Eye Contact::  Good  Speech:  Clear and Coherent  Volume:  Normal  Mood:  Anxious and Euthymic  Affect:  Appropriate and Congruent  Thought Process:  Goal Directed, Intact and Logical  Orientation:  Full (Time, Place, and Person)  Thought Content:  WDL  Suicidal Thoughts:  No  Homicidal Thoughts:  No  Memory:  Immediate;   Good  Judgement:  Intact  Insight:  Fair  Psychomotor Activity:  Normal  Concentration:  Good  Recall:  Good  Fund of Knowledge:Good  Language: Good  Akathisia:  NA  Handed:  Right  AIMS (if indicated):     Assets:  Communication Skills Desire for Improvement Leisure Time Physical Health Resilience Social Support Talents/Skills Transportation Vocational/Educational  Sleep:  Number of Hours: 6    Musculoskeletal: Strength & Muscle Tone: within normal limits Gait & Station: normal Patient leans: N/A   Mental Status Per Nursing Assessment::   On Admission:  Self-harm behaviors  Loss Factors: Financial problems/change in socioeconomic status  Historical Factors: Family history of mental illness or substance abuse and Impulsivity  Risk Reduction Factors:   Sense of responsibility to family, Religious beliefs about death, Living with another person, especially a relative, Positive social support, Positive therapeutic relationship and Positive coping skills or problem solving skills  Continued Clinical Symptoms:  Depression:   Recent sense of peace/wellbeing Previous Psychiatric  Diagnoses and Treatments  Cognitive Features That Contribute To Risk:  Polarized thinking    Suicide Risk:  Minimal: No identifiable suicidal ideation.  Patients presenting with no risk factors but with morbid ruminations; may be classified as minimal risk based on the severity of the depressive symptoms  Discharge Diagnoses:   AXIS I:  Generalized Anxiety Disorder and Major Depression, Recurrent severe AXIS II:  Deferred AXIS III:   Past Medical History  Diagnosis Date  . Depression   . Allergy   . Eating disorder   . UTI (urinary tract infection) 10/03/2013   AXIS IV:  economic problems, housing problems, other psychosocial or environmental problems, problems related to social environment and problems with primary support group AXIS V:  61-70 mild symptoms  Plan Of Care/Follow-up recommendations:  Activity:  As tolerated Diet:  Regular  Is patient on multiple antipsychotic therapies at discharge:  No   Has Patient had three or more failed trials of antipsychotic monotherapy by history:  No  Recommended Plan for Multiple Antipsychotic Therapies: NA    Deanna Valencia,Deanna Valencia. 01/20/2014, 11:21 AM

## 2014-01-20 NOTE — [facility] Group Notes (Signed)
[facility] LCSW Aftercare Discharge Planning Group Note   01/20/2014 8:45 AM  Participation Quality:  Alert, Appropriate and Oriented  Mood/Affect:  Calm  Depression Rating:  2  Anxiety Rating:  2  Thoughts of Suicide:  Pt denies SI/HI  Will you contract for safety?   Yes  Current AVH:  Pt denies  Plan for Discharge/Comments:  Pt attended discharge planning group and actively participated in group.  CSW provided pt with today's workbook. Pt states that she is doing great today and ready to d/c.  Pt was kicked out of her housing prior to admitting to the hospital and plans to stay with friends or at the shelter in [place] before staying with a friend in [place] next week.  Pt requested information on [facility] yesterday and reports calling and setting up an interview with them on Monday at 10:00 am.  Pt states that she can return to [facility] for outpatient medication management and therapy.  No further needs voiced by pt at this time.    Transportation Means: Pt reports access to transportation -  Friend will pick pt up  Supports: No supports mentioned at this time  Regan Lemming, LCSW 01/20/2014 10:21 AM

## 2014-01-20 NOTE — Discharge Instructions (Signed)
Major Depressive Disorder °Major depressive disorder (MDD) is a mental illness. It also may be called clinical depression or unipolar depression. MDD usually causes feelings of sadness, hopelessness, or helplessness. Some people with MDD do not feel particularly sad but lose interest in doing things they used to enjoy (anhedonia). MDD also can cause physical symptoms. It can interfere with work, school, relationships, and other normal everyday activities. MDD varies in severity but is longer lasting and more serious than the sadness we all feel from time to time in our lives. °MDD often is triggered by stressful life events or major life changes. Examples of these triggers include divorce, loss of your job or home, a move, and the death of a family member or close friend. Sometimes MDD occurs for no obvious reason at all. People who have family members with MDD or bipolar disorder are at higher risk for developing MDD, with or without life stressors. MDD can occur at any age. It may occur just once in your life (single episode MDD). It may occur multiple times (recurrent MDD). °SYMPTOMS °People with MDD have either anhedonia or depressed mood on nearly a daily basis for at least 2 weeks or longer. Symptoms of depressed mood include: °· Feelings of sadness (blue or down in the dumps) or emptiness. °· Feelings of hopelessness or helplessness. °· Tearfulness or episodes of crying (may be observed by others). °· Irritability (children and adolescents). °In addition to depressed mood or anhedonia or both, people with MDD have at least four of the following symptoms: °· Difficulty sleeping or sleeping too much.   °· Significant change (increase or decrease) in appetite or weight.   °· Lack of energy or motivation. °· Feelings of guilt and worthlessness.   °· Difficulty concentrating, remembering, or making decisions. °· Unusually slow movement (psychomotor retardation) or restlessness (as observed by others).    °· Recurrent wishes for death, recurrent thoughts of self-harm (suicide), or a suicide attempt. °People with MDD commonly have persistent negative thoughts about themselves, other people, and the world. People with severe MDD may experience distorted beliefs or perceptions about the world (psychotic delusions). They also may see or hear things that are not real (psychotic hallucinations). °DIAGNOSIS °MDD is diagnosed through an assessment by your caregiver. Your caregiver will ask about aspects of your daily life, such as mood, sleep, and appetite, to see if you have the diagnostic symptoms of MDD. Your caregiver may ask about your medical history and use of alcohol or drugs, including prescription medications. Your caregiver also may do a physical exam and blood work. This is because certain medical conditions and the use of certain substances can cause MDD-like symptoms (secondary depression). Your caregiver also may refer you to a mental health specialist for further evaluation and treatment. °TREATMENT °It is important to recognize the symptoms of MDD and seek treatment. The following treatments can be prescribed for MDD:   °· Medication Antidepressant medications usually are prescribed. Antidepressant medications are thought to correct chemical imbalances in the brain that are commonly associated with MDD. Other types of medication may be added if MDD symptoms do not respond to antidepressant medications alone or if psychotic delusions or hallucinations occur. °· Talk therapy Talk therapy can be helpful in treating MDD by providing support, education, and guidance. Certain types of talk therapy also can help with negative thinking (cognitive behavioral therapy) and with relationship issues that trigger MDD (interpersonal therapy). °A mental health specialist can help determine which treatment is best for you. Most people with MDD do well with a   combination of medication and talk therapy. Treatments involving  electrical stimulation of the brain can be used in situations with extremely severe symptoms or when medication and talk therapy do not work over time. These treatments include electroconvulsive therapy, transcranial magnetic stimulation, and vagal nerve stimulation. °Document Released: 02/21/2013 Document Reviewed: 02/21/2013 °Exit[employer]® Patient Information ©2014 [employer]. ° °

## 2014-01-20 NOTE — Progress Notes (Signed)
[facility] Adult Case Management Discharge Plan :  Will you be returning to the same living situation after discharge: Yes,  pt was homeless prior to admission and will continue to be homeless at discharge.  Pt verbalizes a plan to stay at a shelter or with friends in [place] At discharge, do you have transportation home?:Yes,  friend will pick pt up Do you have the ability to pay for your medications:Yes,  provided pt with samples and prescriptions and referred pt to [facility] for assistance with affording meds  Release of information consent forms completed and in the chart;  Patient's signature needed at discharge.  Patient to Follow up at: Follow-up Information   Follow up with [facility] On 01/24/2014. (Walk in on this date for hospital discharge appointment. Walk in clinic is Monday - Friday 8 am - 3 pm. They will than schedule you for medication management and therapy)    Contact information:   [address], [place] [address] Phone: [phone] Fax: [phone]      Patient denies SI/HI:   Yes,  denies SI/Hi    Safety Planning and Suicide Prevention discussed:  Yes,  discussed with pt and pt's father.  See suicide prevention education note.   Ane Payment 01/20/2014, 10:20 AM

## 2014-01-20 NOTE — Tx Team (Signed)
Interdisciplinary Treatment Plan Update (Adult)  Date: 01/20/2014  Time Reviewed:  9:45 AM  Progress in Treatment: Attending groups: Yes Participating in groups:  Yes Taking medication as prescribed:  Yes Tolerating medication:  Yes Family/Significant othe contact made: Yes, with pt's father Patient understands diagnosis:  Yes Discussing patient identified problems/goals with staff:  Yes Medical problems stabilized or resolved:  Yes Denies suicidal/homicidal ideation: Yes Issues/concerns per patient self-inventory:  Yes Other:  New problem(s) identified: N/A  Discharge Plan or Barriers: Pt will follow up at [facility] for outpatient medication management and therapy.    Reason for Continuation of Hospitalization: Stable to d/c today  Comments: N/A  Estimated length of stay: D/C today  For review of initial/current patient goals, please see plan of care.  Attendees: Patient:  Deanna Valencia 01/20/2014 10:17 AM   Family:     Physician:  Dr. Zorita Pang 01/20/2014 10:17 AM   Nursing:   Drake Leach, RN 01/20/2014 10:17 AM   Clinical Social Worker:  Regan Lemming, LCSW 01/20/2014 10:17 AM   Other: Lars Pinks, case manager 01/20/2014 10:17 AM   Other:  Norberto Sorenson, care coordination 01/20/2014 10:17 AM   Other:  Joette Catching, LCSW 01/20/2014 10:17 AM   Other:     Other:    Other:    Other:    Other:    Other:      Scribe for Treatment Team:   Ane Payment, 01/20/2014 , 10:17 AM

## 2014-01-25 NOTE — Progress Notes (Signed)
Patient Discharge Instructions:  After Visit Summary (AVS):   Faxed to:  01/25/14 Discharge Summary Note:   Faxed to:  01/25/14 Psychiatric Admission Assessment Note:   Faxed to:  01/25/14 Suicide Risk Assessment - Discharge Assessment:   Faxed to:  01/25/14 Faxed/Sent to the Next Level Care provider:  01/25/14 Faxed to [facility] [place], 01/25/2014, 4:32 PM

## 2014-03-23 ENCOUNTER — Ambulatory Visit: Payer: Self-pay | Admitting: Pediatrics

## 2014-03-29 ENCOUNTER — Encounter (HOSPITAL_COMMUNITY): Payer: Self-pay | Admitting: Emergency Medicine

## 2014-03-29 ENCOUNTER — Emergency Department (HOSPITAL_COMMUNITY)
Admission: EM | Admit: 2014-03-29 | Discharge: 2014-03-29 | Disposition: A | Discharge status: Other Acute Inpatient Hospital | Payer: Federal, State, Local not specified - PPO | Attending: Emergency Medicine | Admitting: Emergency Medicine

## 2014-03-29 ENCOUNTER — Inpatient Hospital Stay (HOSPITAL_COMMUNITY)
Admission: AD | Admit: 2014-03-29 | Discharge: 2014-03-31 | DRG: 885 | Disposition: A | Discharge status: Home / Self Care | Payer: Federal, State, Local not specified - PPO | Source: Intra-hospital | Attending: Psychiatry | Admitting: Psychiatry

## 2014-03-29 ENCOUNTER — Ambulatory Visit (HOSPITAL_COMMUNITY)
Admission: RE | Admit: 2014-03-29 | Discharge: 2014-03-29 | Disposition: A | Discharge status: Home / Self Care | Payer: Federal, State, Local not specified - PPO | Attending: Psychiatry | Admitting: Psychiatry

## 2014-03-29 ENCOUNTER — Encounter (HOSPITAL_COMMUNITY): Payer: Self-pay | Admitting: *Deleted

## 2014-03-29 DIAGNOSIS — F4321 Adjustment disorder with depressed mood: Secondary | ICD-10-CM

## 2014-03-29 DIAGNOSIS — Z5900 Homelessness unspecified: Secondary | ICD-10-CM | POA: Insufficient documentation

## 2014-03-29 DIAGNOSIS — F332 Major depressive disorder, recurrent severe without psychotic features: Secondary | ICD-10-CM | POA: Insufficient documentation

## 2014-03-29 DIAGNOSIS — R45850 Homicidal ideations: Secondary | ICD-10-CM

## 2014-03-29 DIAGNOSIS — Z880 Allergy status to penicillin: Secondary | ICD-10-CM | POA: Insufficient documentation

## 2014-03-29 DIAGNOSIS — F17200 Nicotine dependence, unspecified, uncomplicated: Secondary | ICD-10-CM | POA: Diagnosis not present

## 2014-03-29 DIAGNOSIS — R45851 Suicidal ideations: Secondary | ICD-10-CM | POA: Insufficient documentation

## 2014-03-29 DIAGNOSIS — Z87440 Personal history of urinary (tract) infections: Secondary | ICD-10-CM | POA: Diagnosis not present

## 2014-03-29 DIAGNOSIS — Z3202 Encounter for pregnancy test, result negative: Secondary | ICD-10-CM | POA: Insufficient documentation

## 2014-03-29 DIAGNOSIS — Z008 Encounter for other general examination: Secondary | ICD-10-CM | POA: Diagnosis present

## 2014-03-29 DIAGNOSIS — F411 Generalized anxiety disorder: Secondary | ICD-10-CM

## 2014-03-29 DIAGNOSIS — F3289 Other specified depressive episodes: Secondary | ICD-10-CM | POA: Diagnosis not present

## 2014-03-29 DIAGNOSIS — Z8669 Personal history of other diseases of the nervous system and sense organs: Secondary | ICD-10-CM | POA: Diagnosis not present

## 2014-03-29 DIAGNOSIS — F1210 Cannabis abuse, uncomplicated: Secondary | ICD-10-CM | POA: Diagnosis present

## 2014-03-29 DIAGNOSIS — F329 Major depressive disorder, single episode, unspecified: Secondary | ICD-10-CM | POA: Diagnosis not present

## 2014-03-29 DIAGNOSIS — Z79899 Other long term (current) drug therapy: Secondary | ICD-10-CM | POA: Diagnosis not present

## 2014-03-29 LAB — COMPREHENSIVE METABOLIC PANEL
ALT: 15 U/L (ref 0–35)
AST: 18 U/L (ref 0–37)
Albumin: 3.9 g/dL (ref 3.5–5.2)
Alkaline Phosphatase: 83 U/L (ref 39–117)
BUN: 8 mg/dL (ref 6–23)
CALCIUM: 9.1 mg/dL (ref 8.4–10.5)
CHLORIDE: 103 meq/L (ref 96–112)
CO2: 24 meq/L (ref 19–32)
Creatinine, Ser: 0.67 mg/dL (ref 0.50–1.10)
GFR calc Af Amer: >90 mL/min (ref >90)
GFR calc non Af Amer: >90 mL/min (ref >90)
Glucose, Bld: 92 mg/dL (ref 70–99)
Potassium: 3.9 mEq/L (ref 3.7–5.3)
SODIUM: 140 meq/L (ref 137–147)
Total Protein: 7.5 g/dL (ref 6.0–8.3)

## 2014-03-29 LAB — CBC
HCT: 34.2 % — ABNORMAL LOW (ref 36.0–46.0)
HEMOGLOBIN: 12.1 g/dL (ref 12.0–15.0)
MCH: 30.4 pg (ref 26.0–34.0)
MCHC: 35.4 g/dL (ref 30.0–36.0)
MCV: 85.9 fL (ref 78.0–100.0)
Platelets: 244 10*3/uL (ref 150–400)
RBC: 3.98 MIL/uL (ref 3.87–5.11)
RDW: 12.5 % (ref 11.5–15.5)
WBC: 7.1 10*3/uL (ref 4.0–10.5)

## 2014-03-29 LAB — RAPID URINE DRUG SCREEN, HOSP PERFORMED
AMPHETAMINES: NOT DETECTED
Barbiturates: NOT DETECTED
Benzodiazepines: NOT DETECTED
Cocaine: NOT DETECTED
OPIATES: NOT DETECTED
TETRAHYDROCANNABINOL: POSITIVE — AB

## 2014-03-29 LAB — ACETAMINOPHEN LEVEL

## 2014-03-29 LAB — ETHANOL: Alcohol, Ethyl (B): <11 mg/dL (ref 0–11)

## 2014-03-29 LAB — SALICYLATE LEVEL: Salicylate Lvl: <2 mg/dL — ABNORMAL LOW (ref 2.8–20.0)

## 2014-03-29 LAB — PREGNANCY, URINE: Preg Test, Ur: NEGATIVE

## 2014-03-29 MED ORDER — LORAZEPAM 1 MG PO TABS: 1.0000 mg | ORAL_TABLET | Freq: Three times a day (TID) | ORAL | Status: DC | PRN

## 2014-03-29 MED ORDER — ALUM & MAG HYDROXIDE-SIMETH 200-200-20 MG/5ML PO SUSP: 30.0000 mL | ORAL | Status: DC | PRN

## 2014-03-29 MED ORDER — NICOTINE 21 MG/24HR TD PT24
21.0000 mg | MEDICATED_PATCH | Freq: Every day | TRANSDERMAL | Status: DC
  Filled 2014-03-29 (×4): qty 1

## 2014-03-29 MED ORDER — ACETAMINOPHEN 325 MG PO TABS: 650.0000 mg | ORAL_TABLET | Freq: Four times a day (QID) | ORAL | Status: DC | PRN

## 2014-03-29 MED ORDER — ONDANSETRON HCL 4 MG PO TABS: 4.0000 mg | ORAL_TABLET | Freq: Three times a day (TID) | ORAL | Status: DC | PRN

## 2014-03-29 MED ORDER — ACETAMINOPHEN 325 MG PO TABS: 650.0000 mg | ORAL_TABLET | ORAL | Status: DC | PRN

## 2014-03-29 MED ORDER — FLUOXETINE HCL 20 MG PO CAPS
40.0000 mg | ORAL_CAPSULE | Freq: Every day | ORAL | Status: DC
  Administered 2014-03-30 – 2014-03-31 (×2): 40 mg via ORAL
  Filled 2014-03-29 (×5): qty 2

## 2014-03-29 MED ORDER — IBUPROFEN 200 MG PO TABS: 600.0000 mg | ORAL_TABLET | Freq: Three times a day (TID) | ORAL | Status: DC | PRN

## 2014-03-29 MED ORDER — ARIPIPRAZOLE 15 MG PO TABS
7.5000 mg | ORAL_TABLET | Freq: Every day | ORAL | Status: DC
  Administered 2014-03-29: 15 mg via ORAL
  Administered 2014-03-30: 22:00:00 via ORAL
  Filled 2014-03-29 (×5): qty 1

## 2014-03-29 MED ORDER — MAGNESIUM HYDROXIDE 400 MG/5ML PO SUSP: 30.0000 mL | Freq: Every day | ORAL | Status: DC | PRN

## 2014-03-29 MED ORDER — ZOLPIDEM TARTRATE 5 MG PO TABS: 5.0000 mg | ORAL_TABLET | Freq: Every evening | ORAL | Status: DC | PRN

## 2014-03-29 NOTE — ED Notes (Signed)
Patient discharge via ambulatory with a steady gait. No acute distress noted. Patient denies SI, HI and AVH at this time.
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
## 2014-03-29 NOTE — Tx Team (Signed)
Initial Interdisciplinary Treatment Plan  PATIENT STRENGTHS: (choose at least two) Ability for insight Active sense of humor Average or above average intelligence Capable of independent living Communication skills General fund of knowledge Motivation for treatment/growth Physical Health  PATIENT STRESSORS: Financial difficulties Marital or family conflict Medication change or noncompliance Substance abuse   PROBLEM LIST: Problem List/Patient Goals Date to be addressed Date deferred Reason deferred Estimated date of resolution  "I wanted to get some medicine again" 03/29/14     Increased risk for suicide 03/29/14     Learn to use coping skills 03/29/14                                          DISCHARGE CRITERIA:  Ability to meet basic life and health needs Adequate post-discharge living arrangements Improved stabilization in mood, thinking, and/or behavior Medical problems require only outpatient monitoring Motivation to continue treatment in a less acute level of care Need for constant or close observation no longer present Reduction of life-threatening or endangering symptoms to within safe limits Safe-care adequate arrangements made Verbal commitment to aftercare and medication compliance  PRELIMINARY DISCHARGE PLAN: Outpatient therapy Return to previous living arrangement  PATIENT/FAMIILY INVOLVEMENT: This treatment plan has been presented to and reviewed with the patient, Deanna Valencia, and/or family member.  The patient and family have been given the opportunity to ask questions and make suggestions.  Doran Heater 03/29/2014, 10:39 PM

## 2014-03-29 NOTE — ED Notes (Signed)
MD at bedside. 

## 2014-03-29 NOTE — BH Assessment (Signed)
Clinician consulted with Patriciaann Clan, PA who states Pt meets criteria for inpatient admission. Katrine Coho, [facility] reported no bed availability at [facility]. Pt can return to [facility] 500 bed when available. Clinician contacted Charge Nurse Tiffany to inform of pt arrival. Pt contacted [facility] for transport to [facility] for medical clearance.   Fredonia Highland, MSW, LCSW Triage Specialist [phone]

## 2014-03-29 NOTE — ED Notes (Addendum)
Pt has 4 dresses, 1 pr of pants, 2 pr of shorts, shirts, underwear, sandals, a brush and 1 jacket.  Seen and wanded by security.  Pt belongings are in locker 53.

## 2014-03-29 NOTE — BH Assessment (Signed)
D/C per Dr. Lovena Le. Writer will provide patient with a list of follow up referrals prior to discharge.
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
## 2014-03-29 NOTE — ED Provider Notes (Signed)
Patient accepted at [facility] under Dr. Lamar Benes for suicidal ideation. Stable for transfer.   Wandra Arthurs, MD 03/29/14 2039

## 2014-03-29 NOTE — ED Notes (Signed)
Family member Patt Steinhardt called and said to tell patient front desk said he could not leave her clothes he had to take them with him, because he has a big garbage bag full of clothes. He said she will have to find a ride and come and pick her clothes up. I asked where were the clothes he said she knows where they are at.
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
## 2014-03-30 DIAGNOSIS — R45851 Suicidal ideations: Secondary | ICD-10-CM

## 2014-03-30 DIAGNOSIS — F1210 Cannabis abuse, uncomplicated: Secondary | ICD-10-CM

## 2014-03-30 DIAGNOSIS — F332 Major depressive disorder, recurrent severe without psychotic features: Principal | ICD-10-CM

## 2014-03-30 DIAGNOSIS — F411 Generalized anxiety disorder: Secondary | ICD-10-CM

## 2014-03-30 LAB — URINALYSIS, ROUTINE W REFLEX MICROSCOPIC
Bilirubin Urine: NEGATIVE
Glucose, UA: NEGATIVE mg/dL
Hgb urine dipstick: NEGATIVE
Ketones, ur: NEGATIVE mg/dL
NITRITE: NEGATIVE
PROTEIN: NEGATIVE mg/dL
Specific Gravity, Urine: 1.009 (ref 1.005–1.030)
UROBILINOGEN UA: 0.2 mg/dL (ref 0.0–1.0)
pH: 7.5 (ref 5.0–8.0)

## 2014-03-30 LAB — URINE MICROSCOPIC-ADD ON

## 2014-03-30 MED ORDER — HYDROXYZINE HCL 25 MG PO TABS: 25.0000 mg | ORAL_TABLET | Freq: Four times a day (QID) | ORAL | Status: DC | PRN
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
## 2014-03-30 NOTE — Progress Notes (Signed)
D: Writer observed pt in bed resting with eyes closed. Pt was easily aroused.  A: Writer introduced self to pt. Writer administered pt's first dose of Abilify at [facility]. Continued support and availability as needed was extended to this pt. Staff continue to monitor pt with q39min checks. R: Pt voices familiarity with administered drug. No adverse drug reactions noted. Pt receptive to treatment. Pt remains safe at this time.
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
## 2014-03-30 NOTE — Progress Notes (Signed)
Patient ID: Deanna Valencia, female   DOB: 04/04/1996, 18 y.o.   MRN: [id number]  D: Pt. Denies SI/HI and A/V Hallucinations. Patient rates her depression and hopelessness at 1/10 for the day. Patient reports that she slept well and her appetite is good. Patient does not report any pain or discomfort at this time. Patient reports that when she goes home she will use her coping skills.  A: Support and encouragement provided to the patient. Scheduled medication administered to patient per physician's orders.  R: Patient is receptive and cooperative but minimal. Patient is seen in the milieu. Q15 minute checks are maintained for safety.

## 2014-03-30 NOTE — Progress Notes (Signed)
Patient ID: Deanna Valencia, female   DOB: 09/15/1996, 18 y.o.   MRN: [id number]  [facility] 9 AM  The focus of this group is to educate the patient on the purpose and policies of crisis stabilization and provide a format to answer questions about their admission.  The group details unit policies and expectations of patients while admitted.  Patient did not attend group.
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
## 2014-03-31 LAB — GC/CHLAMYDIA PROBE AMP
CT PROBE, AMP APTIMA: POSITIVE — AB
GC PROBE AMP APTIMA: NEGATIVE

## 2014-03-31 LAB — RPR

## 2014-03-31 MED ORDER — AZITHROMYCIN 500 MG PO TABS
1000.0000 mg | ORAL_TABLET | Freq: Every day | ORAL | Status: AC
  Administered 2014-03-31: 1000 mg via ORAL
  Filled 2014-03-31: qty 2

## 2014-03-31 MED ORDER — FLUOXETINE HCL 40 MG PO CAPS: 40.0000 mg | ORAL_CAPSULE | Freq: Every day | ORAL | Status: DC

## 2014-03-31 MED ORDER — ARIPIPRAZOLE 15 MG PO TABS: 7.5000 mg | ORAL_TABLET | Freq: Every day | ORAL | Status: DC

## 2014-03-31 MED ORDER — HYDROXYZINE HCL 25 MG PO TABS: 25.0000 mg | ORAL_TABLET | Freq: Four times a day (QID) | ORAL | Status: DC | PRN

## 2014-03-31 NOTE — Tx Team (Addendum)
Interdisciplinary Treatment Plan Update (Adult)  Date: 03/31/2014  Time Reviewed:  9:45 AM  Progress in Treatment: Attending groups: Yes Participating in groups:  Yes Taking medication as prescribed:  Yes Tolerating medication:  Yes Family/Significant othe contact made: Yes, with pt's father Patient understands diagnosis:  Yes Discussing patient identified problems/goals with staff:  Yes Medical problems stabilized or resolved:  Yes Denies suicidal/homicidal ideation: Yes Issues/concerns per patient self-inventory:  Yes Other:  New problem(s) identified: N/A  Discharge Plan or Barriers: Pt has follow up scheduled at [facility] for outpatient medication management and therapy.    Reason for Continuation of Hospitalization: Stable to d/c  Comments: N/A  Estimated length of stay: D/C today  For review of initial/current patient goals, please see plan of care.  Attendees: Patient:  Deanna Valencia  03/31/2014 10:42 AM   Family:     Physician:  Dr. Zorita Pang 03/31/2014 10:19 AM   Nursing:   Drake Leach, RN 03/31/2014 10:19 AM   Clinical Social Worker:  Regan Lemming, LCSW 03/31/2014 10:19 AM   Other: Monia Sabal, RN 03/31/2014 10:19 AM   Other:  Gala Romney, care coordination 03/31/2014 10:19 AM   Other:  Joette Catching, LCSW 03/31/2014 10:19 AM   Other:  Chuck Hint, RN 03/31/2014 10:19 AM   Other:    Other:    Other:    Other:    Other:    Other:     Scribe for Treatment Team:   Ane Payment, 03/31/2014 10:19 AM

## 2014-03-31 NOTE — Progress Notes (Signed)
D: Pt is anxious in affect and mood. Pt is requesting hydroxyzine for anxiety. Pt declined the option of Ativan and preferred the Vistaril. She reports a hx of this drug being effective for her anxiety in March of 2015 at [facility]. Writer was able to verify the mentioned drug in this pt's encounter history. Pt is currently voicing a strong desire to be discharged soon. She reports that her main reason for admission was for her meds to be adjusted. Pt observed with minimal interaction within the milieu this evening.  A: Writer administered scheduled medications to pt. Continued support and availability as needed was extended to this pt. Staff continue to monitor pt with q71min checks.  R: No adverse drug reactions noted. Pt receptive to treatment. Pt remains safe at this time.

## 2014-03-31 NOTE — [facility] Group Notes (Signed)
[place] LCSW Group Therapy  03/31/2014  1:15 PM   Type of Therapy:  Group Therapy  Participation Level:  Active  Participation Quality:  Attentive, Sharing and Supportive  Affect: Calm  Cognitive:  Alert and Oriented  Insight:  Developing/Improving and Engaged  Engagement in Therapy:  Developing/Improving and Engaged  Modes of Intervention:  Clarification, Confrontation, Discussion, Education, Exploration, Limit-setting, Orientation, Problem-solving, Rapport Building, Art therapist, Socialization and Support  Summary of Progress/Problems: The topic for today was feelings about relapse.  Pt discussed what relapse prevention is to them and identified triggers that they are on the path to relapse.  Pt processed their feeling towards relapse and was able to relate to peers.  Pt discussed coping skills that can be used for relapse prevention.  Pt shared that relapse to her is cutting again.  CSW encouraged pt to talk further about what led to her admission.  Pt shared that she continues to come here for medication management because she fails to follow through with outpatient.  Pt was able to identify that if she follows through with her outpatient, she could prevent future relapses.  Pt continues to have limited insight on her recovery, but is showing some improvement.  Pt actively participated and was engaged in group discussion.    Regan Lemming, LCSW 03/31/2014  2:42 PM
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
## 2014-03-31 NOTE — Progress Notes (Addendum)
D) Pt being discharged to home via the city bus system. Pt did ask her father to take her home but he was unwilling to comply with Pt's request. Affect and mood are appropriate. Pt denies SI and HI and rates her depression and hopelessness both at a 1.  A) Given support, reassurance, praise and encouragement. Provided with a bus pass. Provided with a 1:1 . All discharge follow up plans and medications explained to Pt.  R) Pt denies SI and HI.

## 2014-03-31 NOTE — [facility] Suicide Risk Assessment (Signed)
[place] INPATIENT:  Family/Significant Other Suicide Prevention Education  Suicide Prevention Education:  Education Completed; Markelle Asaro - father [phone]),  (name of family member/significant other) has been identified by the patient as the family member/significant other with whom the patient will be residing, and identified as the person(s) who will aid the patient in the event of a mental health crisis (suicidal ideations/suicide attempt).  With written consent from the patient, the family member/significant other has been provided the following suicide prevention education, prior to the and/or following the discharge of the patient.  The suicide prevention education provided includes the following:  Suicide risk factors  Suicide prevention and interventions  National Suicide Hotline telephone number  [facility] assessment telephone number  [facility] Emergency Assistance [place] and/or Residential Mobile Crisis Unit telephone number  Request made of family/significant other to:  Remove weapons (e.g., guns, rifles, knives), all items previously/currently identified as safety concern.    Remove drugs/medications (over-the-counter, prescriptions, illicit drugs), all items previously/currently identified as a safety concern.  The family member/significant other verbalizes understanding of the suicide prevention education information provided.  The family member/significant other agrees to remove the items of safety concern listed above.  Father expressed frustration with the "system", stating that pt needs to be institutonalized for a long period of time as she continues to go back out and do the same thing.  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  Father states that he's had enough of helping pt and she is on her own now.    Deanna Valencia 03/31/2014, 11:45 AM

## 2014-03-31 NOTE — Progress Notes (Signed)
[facility] Adult Case Management Discharge Plan :  Will you be returning to the same living situation after discharge: Yes,  returning to home with friends support.  Pt has conflict in this home at times but it is her only living option At discharge, do you have transportation home?:Yes,  provided pt with a bus pass Do you have the ability to pay for your medications:Yes,  provided pt with samples and prescriptions and referred pt to [facility] for assistance with affording meds  Release of information consent forms completed and in the chart;  Patient's signature needed at discharge.  Patient to Follow up at: Follow-up Information   Follow up with [facility] On 04/04/2014. (Walk in between 8am-9am Monday through Friday for hospital follow-up/medication management/assessment for therapy services. )    Contact information:   [address], [place] [address] Phone: [phone] Fax: [phone]      Patient denies SI/HI:   Yes,  denies SI/HI    Safety Planning and Suicide Prevention discussed:  Yes,  discussed with pt and pt's father.  Father states that he is no longer willing to be supportive to pt now.  See suicide prevention education note.   Deanna Valencia 03/31/2014, 10:43 AM

## 2014-04-01 LAB — URINE CULTURE: Special Requests: NORMAL

## 2014-04-05 NOTE — Progress Notes (Signed)
Patient Discharge Instructions:  After Visit Summary (AVS):   Faxed to:  04/05/14 Psychiatric Admission Assessment Note:   Faxed to:  04/05/14 Suicide Risk Assessment - Discharge Assessment:   Faxed to:  04/05/14 Faxed/Sent to the Next Level Care provider:  04/05/14 Faxed to [facility], 04/05/2014, 3:54 PM
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
## 2014-05-17 ENCOUNTER — Ambulatory Visit: Payer: Medicaid Other | Admitting: Pediatrics

## 2014-05-27 ENCOUNTER — Ambulatory Visit: Payer: Self-pay | Admitting: Pediatrics

## 2014-06-30 ENCOUNTER — Encounter: Payer: Self-pay | Admitting: Pediatrics

## 2014-06-30 ENCOUNTER — Ambulatory Visit (INDEPENDENT_AMBULATORY_CARE_PROVIDER_SITE_OTHER): Payer: Medicaid Other | Admitting: Pediatrics

## 2014-06-30 VITALS — BP 108/78 | Ht 61.22 in | Wt 177.4 lb

## 2014-06-30 DIAGNOSIS — Z3202 Encounter for pregnancy test, result negative: Secondary | ICD-10-CM

## 2014-06-30 DIAGNOSIS — Z7251 High risk heterosexual behavior: Secondary | ICD-10-CM

## 2014-06-30 DIAGNOSIS — L732 Hidradenitis suppurativa: Secondary | ICD-10-CM

## 2014-06-30 DIAGNOSIS — A749 Chlamydial infection, unspecified: Secondary | ICD-10-CM

## 2014-06-30 LAB — POCT URINE PREGNANCY: Preg Test, Ur: NEGATIVE

## 2014-06-30 MED ORDER — LEVONORGESTREL 1.5 MG PO TABS
1.5000 mg | ORAL_TABLET | Freq: Once | ORAL | Status: AC
  Administered 2014-06-30: 1.5 mg via ORAL

## 2014-06-30 MED ORDER — AZITHROMYCIN 250 MG PO TABS
1000.0000 mg | ORAL_TABLET | Freq: Once | ORAL | Status: AC
  Administered 2014-06-30: 1000 mg via ORAL

## 2014-06-30 MED ORDER — DOXYCYCLINE HYCLATE 100 MG PO TABS: 100.0000 mg | ORAL_TABLET | Freq: Two times a day (BID) | ORAL | Status: DC

## 2014-06-30 MED ORDER — CEFTRIAXONE SODIUM 1 G IJ SOLR
500.0000 mg | Freq: Once | INTRAMUSCULAR | Status: AC
  Administered 2014-06-30: 500 mg via INTRAMUSCULAR

## 2014-06-30 MED ORDER — IBUPROFEN 600 MG PO TABS: 600.0000 mg | ORAL_TABLET | Freq: Four times a day (QID) | ORAL | Status: DC | PRN

## 2014-06-30 NOTE — Progress Notes (Signed)
Adolescent Medicine Consultation Follow-Up Visit Deanna Valencia  is a 18 y.o. female here today for follow-up of STI check.   PCP Confirmed?  yes  Tomasita Beevers, Victorino December, MD   History was provided by the patient.  Chart review:  Last seen by Dr. Henrene Pastor on 12/26/13.  Treatment plan at last visit included treatment for trichomoniasis.  Discussed birth control options but pt declined.  She was given Plan B. Since then she was hospitalized for Depression at [facility] twice,  01/17/14-01/20/14 and 03/29/14 to 03/31/14.  F/u at [facility] was recommended when she was discharge.  She had a positive CT while hospitalized and 1000 mg azithromycin was administered 03/31/14.    Last STI screen:  Component     Latest Ref Rng 03/30/2014  CT Probe RNA     NEGATIVE POSITIVE (A)  GC Probe RNA     NEGATIVE NEGATIVE   Component     Latest Ref Rng 03/30/2014  RPR     NON REAC NON REAC   POS Trich 11/15/13  Previous Pysch Screenings: RAAPs and PHQ-SADs 11/15/13 (completed with [facility]) Immunizations: Only flu is documented  Psych Screenings completed for today's visit: None  HPI:  Pt reports she has not been to the doctor since discharge from [facility].  No getting psych care anywhere.  Life has been really hectic, was homeless for awhile.  Has a place to stay now.  Plans to go back to therapy after settled in.  Doing online [place] [place].  Mood has been better, even though her boyfriend is in jail.  Feels having a boyfriend has helped her a lot.  Previously did not have faith that things would get better, did not have hope.  Recognizes that there is hope.  Boyfriend is in lock-up for going to [place] because he was "banned" from that store.  Got arrested with him.  Not as depressed, not cutting.    Has underarm in-grown hair, very painful.  Previously occurred but not this painful.  No fever.  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  Plans to try to get another job and has an interview today, but prefers this  method of income until she is more established.  Discussed looking for other means of income for her.  She declined help and assistance. Did not use protection recently with sexual contacts recently. Last unprotected sex Tuesday.  No dysuria. No new vag discharge. No dyspareunia. No abdn pain.  Patient's last menstrual period was 06/13/2014.  ROS per HPI  The following portions of the patient's history were reviewed and updated as appropriate: allergies, current medications and problem list.  Allergies  Allergen Reactions  . Amoxicillin Hives  . Tomato Itching and Rash   Physical Exam:  Filed Vitals:   06/30/14 1127  BP: 108/78  Height: 5' 1.22" (1.555 m)  Weight: 177 lb 6.4 oz (80.468 kg)   BP 108/78  Ht 5' 1.22" (1.555 m)  Wt 177 lb 6.4 oz (80.468 kg)  BMI 33.28 kg/m2  LMP 06/13/2014 Body mass index: body mass index is 33.28 kg/(m^2). Blood pressure percentiles are 93% systolic and 71% diastolic based on 6967 NHANES data. Blood pressure percentile targets: 90: 122/78, 95: 125/82, 99: 138/94.  Physical Exam  Constitutional: No distress.  Neck: No thyromegaly present.  Cardiovascular: Normal rate and regular rhythm.   No murmur heard. Pulmonary/Chest: Breath sounds normal.  Abdominal: Soft. There is no tenderness. There is no guarding.  Lymphadenopathy:    She  has no cervical adenopathy.  Left axilla with tender, somewhat fluctuant mass c/w hidradenitis suppurotiva    Assessment/Plan: 1. Hidradenitis suppurativa of left axilla Advised likely needing I&D.  Will try antibiotics, NSAIDs and warm soaks but consider referral to Gen Surg in near future - doxycycline 100 mg po BID x 10 days - ibuprofen (ADVIL,MOTRIN) 600 MG tablet; Take 1 tablet (600 mg total) by mouth every 6 (six) hours as needed.  Dispense: 30 tablet; Refill: 0  2. Unprotected sexual intercourse 3. High risk sexual behavior - cefTRIAXone (ROCEPHIN) injection 500 mg; Inject 0.5 g (500 mg total) into  the muscle once. - azithromycin (ZITHROMAX) tablet 1,000 mg; Take 4 tablets (1,000 mg total) by mouth once. - levonorgestrel (PLAN B 1-STEP) tablet 1.5 mg; Take 1 tablet (1.5 mg total) by mouth once. - GC/chlamydia probe amp, urine - HIV antibody - RPR - Trichomonas vaginalis, RNA  4. Pregnancy examination or test, negative result - POCT urine pregnancy  Follow-up:  1 week  Medical decision-making:  > 25 minutes spent, more than 50% of appointment was spent discussing diagnosis and management of symptoms

## 2014-06-30 NOTE — Patient Instructions (Signed)
Hidradenitis Suppurativa, Sweat Gland Abscess Hidradenitis suppurativa is a long lasting (chronic), uncommon disease of the sweat glands. With this, boil-like lumps and scarring develop in the groin, some times under the arms (axillae), and under the breasts. It may also uncommonly occur behind the ears, in the crease of the buttocks, and around the genitals.  CAUSES  The cause is from a blocking of the sweat glands. They then become infected. It may cause drainage and odor. It is not contagious. So it cannot be given to someone else. It most often shows up in puberty (about 10 to 19 years of age). But it may happen much later. It is similar to acne which is a disease of the sweat glands. This condition is slightly more common in African-Americans and women. SYMPTOMS   Hidradenitis usually starts as one or more red, tender, swellings in the groin or under the arms (axilla).  Over a period of hours to days the lesions get larger. They often open to the skin surface, draining clear to yellow-colored fluid.  The infected area heals with scarring. DIAGNOSIS  Your caregiver makes this diagnosis by looking at you. Sometimes cultures (growing germs on plates in the lab) may be taken. This is to see what germ (bacterium) is causing the infection.  TREATMENT   Topical germ killing medicine applied to the skin (antibiotics) are the treatment of choice. Antibiotics taken by mouth (systemic) are sometimes needed when the condition is getting worse or is severe.  Avoid tight-fitting clothing which traps moisture in.  Dirt does not cause hidradenitis and it is not caused by poor hygiene.  Involved areas should be cleaned daily using an antibacterial soap. Some patients find that the liquid form of Lever 2000, applied to the involved areas as a lotion after bathing, can help reduce the odor related to this condition.  Sometimes surgery is needed to drain infected areas or remove scarred tissue. Removal of  large amounts of tissue is used only in severe cases.  Birth control pills may be helpful.  Oral retinoids (vitamin A derivatives) for 6 to 12 months which are effective for acne may also help this condition.  Weight loss will improve but not cure hidradenitis. It is made worse by being overweight. But the condition is not caused by being overweight.  This condition is more common in people who have had acne.  It may become worse under stress. There is no medical cure for hidradenitis. It can be controlled, but not cured. The condition usually continues for years with periods of getting worse and getting better (remission). Document Released: 06/10/2004 Document Revised: 01/19/2012 Document Reviewed: 01/27/2014 Exit[employer] Patient Information 2015 [employer]. This information is not intended to replace advice given to you by your health care provider. Make sure you discuss any questions you have with your health care provider.  

## 2014-07-01 ENCOUNTER — Encounter (HOSPITAL_COMMUNITY): Payer: Self-pay | Admitting: Emergency Medicine

## 2014-07-01 ENCOUNTER — Emergency Department (HOSPITAL_COMMUNITY)
Admission: EM | Admit: 2014-07-01 | Discharge: 2014-07-01 | Disposition: A | Discharge status: Home / Self Care | Payer: Federal, State, Local not specified - PPO | Attending: Emergency Medicine | Admitting: Emergency Medicine

## 2014-07-01 DIAGNOSIS — Z8659 Personal history of other mental and behavioral disorders: Secondary | ICD-10-CM | POA: Insufficient documentation

## 2014-07-01 DIAGNOSIS — Z87440 Personal history of urinary (tract) infections: Secondary | ICD-10-CM | POA: Insufficient documentation

## 2014-07-01 DIAGNOSIS — IMO0002 Reserved for concepts with insufficient information to code with codable children: Secondary | ICD-10-CM | POA: Insufficient documentation

## 2014-07-01 DIAGNOSIS — F17200 Nicotine dependence, unspecified, uncomplicated: Secondary | ICD-10-CM | POA: Insufficient documentation

## 2014-07-01 DIAGNOSIS — L02412 Cutaneous abscess of left axilla: Secondary | ICD-10-CM

## 2014-07-01 DIAGNOSIS — Z880 Allergy status to penicillin: Secondary | ICD-10-CM | POA: Diagnosis not present

## 2014-07-01 LAB — RPR

## 2014-07-01 LAB — GC/CHLAMYDIA PROBE AMP, URINE
CHLAMYDIA, SWAB/URINE, PCR: POSITIVE — AB
GC Probe Amp, Urine: NEGATIVE

## 2014-07-01 LAB — HIV ANTIBODY (ROUTINE TESTING W REFLEX): HIV 1&2 Ab, 4th Generation: NONREACTIVE

## 2014-07-01 MED ORDER — IBUPROFEN 400 MG PO TABS
800.0000 mg | ORAL_TABLET | Freq: Once | ORAL | Status: AC
  Administered 2014-07-01: 800 mg via ORAL
  Filled 2014-07-01: qty 2

## 2014-07-01 MED ORDER — SULFAMETHOXAZOLE-TRIMETHOPRIM 800-160 MG PO TABS: 1.0000 | ORAL_TABLET | Freq: Two times a day (BID) | ORAL | Status: AC

## 2014-07-01 NOTE — ED Notes (Signed)
Declined W/C at D/C and was escorted to lobby by RN. 

## 2014-07-01 NOTE — ED Notes (Signed)
Pt. Stated, I have an abscess under my left arm ffor a week. i went to my doctor yesteray and given antibiotic but my Medicare would not pay for it.

## 2014-07-01 NOTE — ED Provider Notes (Signed)
CSN: [id number]     Arrival date & time 07/01/14  1121 History  This chart was scribed for non-physician practitioner Starlyn Skeans PA-C working with Arbie Cookey, * by Ludger Nutting, ED Scribe. This patient was seen in room TR06C/TR06C and the patient's care was started at 11:35 AM.    Chief Complaint  Patient presents with  . Abscess    The history is provided by the patient. No language interpreter was used.    HPI Comments: Deanna Valencia is a 18 y.o. female who presents to the Emergency Department complaining of an abscess to the left axilla for the past 5-6 days. Patient states she was seen by PCP yesterday and was prescribed doxycycline but was unable to have it filled.  She reports constant pain with associated warmth and redness. She reports feeling nauseated yesterday. She has applied warm compresses and taken OTC pain medication without relief. She reports similar symptoms in the past but not as severe. She denies drainage, red streaks, fever, chills, vomiting. She has otherwise negative ROS.   Past Medical History  Diagnosis Date  . Depression   . Allergy   . Eating disorder   . UTI (urinary tract infection) 10/03/2013   History reviewed. No pertinent past surgical history. No family history on file. History  Substance Use Topics  . Smoking status: Light Tobacco Smoker -- 0.25 packs/day for 1 years    Types: Cigarettes  . Smokeless tobacco: Never Used     Comment: states smokes occasionally not daily  . Alcohol Use: Yes     Comment: occasionally   OB History   Grav Para Term Preterm Abortions TAB SAB Ect Mult Living                 Review of Systems  See HPI  Allergies  Amoxicillin and Tomato  Home Medications   Prior to Admission medications   Medication Sig Start Date End Date Taking? Authorizing Provider  ibuprofen (ADVIL,MOTRIN) 600 MG tablet Take 1 tablet (600 mg total) by mouth every 6 (six) hours as needed. 06/30/14   Andree Coss, MD  sulfamethoxazole-trimethoprim (SEPTRA DS) 800-160 MG per tablet Take 1 tablet by mouth 2 (two) times daily. 07/01/14 07/08/14  Tamikka Pilger A Forcucci, PA-C   BP 147/92  Pulse 94  Temp(Src) 98.3 F (36.8 C) (Oral)  Resp 18  SpO2 97%  LMP 06/13/2014 Physical Exam  Nursing note and vitals reviewed. Constitutional: She is oriented to person, place, and time. She appears well-developed and well-nourished.  HENT:  Head: Normocephalic and atraumatic.  Mouth/Throat: Oropharynx is clear and moist. No oropharyngeal exudate.  Eyes: Conjunctivae and EOM are normal. Pupils are equal, round, and reactive to light.  Neck: Normal range of motion. Neck supple.  Cardiovascular: Normal rate, regular rhythm, normal heart sounds and intact distal pulses.  Exam reveals no gallop and no friction rub.   No murmur heard. Pulmonary/Chest: Effort normal and breath sounds normal. No respiratory distress. She has no wheezes. She has no rales. She exhibits no tenderness.  Abdominal: She exhibits no distension.  Musculoskeletal: Normal range of motion.  Neurological: She is alert and oriented to person, place, and time.  Skin: Skin is warm and dry.  2 cm x 3 cm fluctuant abscess in the left axilla.  No surrounding erythema.  There is tenderness to palpation of the abscess without induration or active drainage.  There is no surrounding erythema or warmth.    Psychiatric: She has a normal  mood and affect. Her behavior is normal. Judgment and thought content normal.    ED Course  Procedures (including critical care time)  DIAGNOSTIC STUDIES: Oxygen Saturation is 97% on RA, adequate by my interpretation.    COORDINATION OF CARE: 11:39 AM Will I&D the affected area. Discussed treatment plan with pt at bedside and pt agreed to plan.  3:44 PM INCISION AND DRAINAGE PROCEDURE NOTE: Patient identification was confirmed and verbal consent was obtained. This procedure was performed by Starlyn Skeans PA-C at  3:44 PM. Site: left axilla Sterile procedures observed yes Needle size: 25 g Anesthetic used (type and amt): 2% lido with epi Blade size: 10 Drainage: copious amounts of foul smelling purulent drainage and discharge Complexity: Complex Packing not used Site anesthetized, incision made over site, wound drained and explored loculations, rinsed with copious amounts of normal saline, wound packed with sterile gauze, covered with dry, sterile dressing.  Pt tolerated procedure well without complications.  Instructions for care discussed verbally and pt provided with additional written instructions for homecare and f/u.    Labs Review Labs Reviewed - No data to display  Imaging Review No results found.   EKG Interpretation None      MDM   Final diagnoses:  Abscess of axilla, left   Patient is an 18 y.o. Female who presents to the ED with left axillary abscess.  There is no surrounding cellulitis at this time, but given recurrence patient will be given prescription for bactrim upon discharge.  Abscess was drained as seen above.  Patient tolerated the procedure well.  Patient was told to use warm compresses as often as she could tolerate and to take her antibiotics until they were gone.  Patient is to follow-up with Dr. Henrene Pastor for a wound recheck.  Patient was told to return for fever, nausea, vomiting, or streaking of the arm.  She states understanding and agreement to the above plan.  I personally performed the services described in this documentation, which was scribed in my presence. The recorded information has been reviewed and is accurate.    Cherylann Parr, PA-C 07/01/14 1544

## 2014-07-01 NOTE — Discharge Instructions (Signed)

## 2014-07-04 LAB — TRICHOMONAS VAGINALIS, PROBE AMP: T vaginalis RNA: POSITIVE — AB

## 2014-07-05 ENCOUNTER — Telehealth: Payer: Self-pay

## 2014-07-05 NOTE — Telephone Encounter (Signed)
Advised patient she is positive for trich and needs to come by the office for treatment.  She verbalized understanding and will be in tomorrow at 1330.

## 2014-07-05 NOTE — ED Provider Notes (Signed)
Medical screening examination/treatment/procedure(s) were performed by non-physician practitioner and as supervising physician I was immediately available for consultation/collaboration.   EKG Interpretation None        Houston Siren III, MD 07/05/14 [phone]

## 2014-07-06 ENCOUNTER — Ambulatory Visit (INDEPENDENT_AMBULATORY_CARE_PROVIDER_SITE_OTHER): Payer: Medicaid Other

## 2014-07-06 VITALS — BP 120/80 | Wt 180.0 lb

## 2014-07-06 DIAGNOSIS — A599 Trichomoniasis, unspecified: Secondary | ICD-10-CM

## 2014-07-06 DIAGNOSIS — A749 Chlamydial infection, unspecified: Secondary | ICD-10-CM

## 2014-07-06 MED ORDER — METRONIDAZOLE 250 MG PO TABS
2000.0000 mg | ORAL_TABLET | Freq: Once | ORAL | Status: AC
  Administered 2014-07-06: 2000 mg via ORAL

## 2014-07-06 MED ORDER — AZITHROMYCIN 250 MG PO TABS
1000.0000 mg | ORAL_TABLET | Freq: Once | ORAL | Status: AC
  Administered 2014-07-06: 1000 mg via ORAL

## 2014-07-06 NOTE — Progress Notes (Signed)
Patient presented for tx of positive STIs.  2 G of Flagyl and 1 G Azithromycin given.  Watched patient take all of meds.  Could not interest her in Depo or other BC methods today.

## 2014-07-07 NOTE — Progress Notes (Signed)
Pt was discussed with me and I determined the treatment plan that was completed.

## 2014-07-09 ENCOUNTER — Encounter: Payer: Self-pay | Admitting: Pediatrics

## 2014-07-09 DIAGNOSIS — Z7251 High risk heterosexual behavior: Secondary | ICD-10-CM | POA: Insufficient documentation

## 2014-07-19 ENCOUNTER — Ambulatory Visit: Payer: Self-pay | Admitting: Pediatrics

## 2014-08-15 ENCOUNTER — Encounter (HOSPITAL_COMMUNITY): Payer: Self-pay | Admitting: Emergency Medicine

## 2014-08-15 ENCOUNTER — Emergency Department (HOSPITAL_COMMUNITY)
Admission: EM | Admit: 2014-08-15 | Discharge: 2014-08-17 | Disposition: A | Discharge status: Other Acute Inpatient Hospital | Payer: Federal, State, Local not specified - PPO | Attending: Emergency Medicine | Admitting: Emergency Medicine

## 2014-08-15 DIAGNOSIS — R45851 Suicidal ideations: Secondary | ICD-10-CM | POA: Diagnosis present

## 2014-08-15 DIAGNOSIS — Z87440 Personal history of urinary (tract) infections: Secondary | ICD-10-CM | POA: Insufficient documentation

## 2014-08-15 DIAGNOSIS — Z880 Allergy status to penicillin: Secondary | ICD-10-CM | POA: Insufficient documentation

## 2014-08-15 DIAGNOSIS — Z720 Tobacco use: Secondary | ICD-10-CM | POA: Insufficient documentation

## 2014-08-15 DIAGNOSIS — F333 Major depressive disorder, recurrent, severe with psychotic symptoms: Secondary | ICD-10-CM

## 2014-08-15 DIAGNOSIS — F332 Major depressive disorder, recurrent severe without psychotic features: Secondary | ICD-10-CM | POA: Diagnosis present

## 2014-08-15 LAB — CBC
HEMATOCRIT: 39.6 % (ref 36.0–46.0)
Hemoglobin: 13.6 g/dL (ref 12.0–15.0)
MCH: 30 pg (ref 26.0–34.0)
MCHC: 34.3 g/dL (ref 30.0–36.0)
MCV: 87.2 fL (ref 78.0–100.0)
PLATELETS: 263 10*3/uL (ref 150–400)
RBC: 4.54 MIL/uL (ref 3.87–5.11)
RDW: 12.8 % (ref 11.5–15.5)
WBC: 5.7 10*3/uL (ref 4.0–10.5)

## 2014-08-15 LAB — RAPID URINE DRUG SCREEN, HOSP PERFORMED
Amphetamines: NOT DETECTED
Barbiturates: NOT DETECTED
Benzodiazepines: NOT DETECTED
COCAINE: NOT DETECTED
Opiates: NOT DETECTED
Tetrahydrocannabinol: POSITIVE — AB

## 2014-08-15 NOTE — ED Notes (Signed)
Pt brought in by [employer], reports pt was on a bridge.  Pt is crying while this nurse assessed her.  Pt is upset because her boyfriend went to jail.  Would not answer yes or no when asked if she is suicidal-pt shrugs her shoulder when she's asked about SI.

## 2014-08-16 ENCOUNTER — Encounter (HOSPITAL_COMMUNITY): Payer: Self-pay | Admitting: Registered Nurse

## 2014-08-16 DIAGNOSIS — F332 Major depressive disorder, recurrent severe without psychotic features: Secondary | ICD-10-CM

## 2014-08-16 DIAGNOSIS — R45851 Suicidal ideations: Secondary | ICD-10-CM

## 2014-08-16 LAB — COMPREHENSIVE METABOLIC PANEL
ALT: 10 U/L (ref 0–35)
ANION GAP: 19 — AB (ref 5–15)
AST: 17 U/L (ref 0–37)
Albumin: 4.3 g/dL (ref 3.5–5.2)
Alkaline Phosphatase: 87 U/L (ref 39–117)
BUN: 8 mg/dL (ref 6–23)
CO2: 22 mEq/L (ref 19–32)
Calcium: 10 mg/dL (ref 8.4–10.5)
Chloride: 98 mEq/L (ref 96–112)
Creatinine, Ser: 0.72 mg/dL (ref 0.50–1.10)
GFR calc Af Amer: >90 mL/min (ref >90)
GFR calc non Af Amer: >90 mL/min (ref >90)
Glucose, Bld: 91 mg/dL (ref 70–99)
POTASSIUM: 3.8 meq/L (ref 3.7–5.3)
Sodium: 139 mEq/L (ref 137–147)
TOTAL PROTEIN: 8.4 g/dL — AB (ref 6.0–8.3)
Total Bilirubin: 0.3 mg/dL (ref 0.3–1.2)

## 2014-08-16 LAB — ACETAMINOPHEN LEVEL

## 2014-08-16 LAB — SALICYLATE LEVEL: Salicylate Lvl: <2 mg/dL — ABNORMAL LOW (ref 2.8–20.0)

## 2014-08-16 LAB — ETHANOL

## 2014-08-16 MED ORDER — ZOLPIDEM TARTRATE 5 MG PO TABS: 5.0000 mg | ORAL_TABLET | Freq: Every evening | ORAL | Status: DC | PRN

## 2014-08-16 MED ORDER — ACETAMINOPHEN 325 MG PO TABS: 650.0000 mg | ORAL_TABLET | ORAL | Status: DC | PRN

## 2014-08-16 MED ORDER — HYDROXYZINE HCL 25 MG PO TABS
25.0000 mg | ORAL_TABLET | Freq: Three times a day (TID) | ORAL | Status: DC | PRN
  Administered 2014-08-16: 25 mg via ORAL
  Filled 2014-08-16: qty 1

## 2014-08-16 MED ORDER — TRAZODONE HCL 50 MG PO TABS
50.0000 mg | ORAL_TABLET | Freq: Every day | ORAL | Status: DC
  Administered 2014-08-16: 50 mg via ORAL
  Filled 2014-08-16: qty 1

## 2014-08-16 MED ORDER — ALUM & MAG HYDROXIDE-SIMETH 200-200-20 MG/5ML PO SUSP: 30.0000 mL | ORAL | Status: DC | PRN

## 2014-08-16 MED ORDER — ONDANSETRON HCL 4 MG PO TABS: 4.0000 mg | ORAL_TABLET | Freq: Three times a day (TID) | ORAL | Status: DC | PRN

## 2014-08-16 MED ORDER — IBUPROFEN 200 MG PO TABS
600.0000 mg | ORAL_TABLET | Freq: Three times a day (TID) | ORAL | Status: DC | PRN
Start: 2014-08-16 — End: 2014-08-17

## 2014-08-16 MED ORDER — NICOTINE 21 MG/24HR TD PT24: 21.0000 mg | MEDICATED_PATCH | Freq: Every day | TRANSDERMAL | Status: DC

## 2014-08-16 MED ORDER — ARIPIPRAZOLE 5 MG PO TABS
5.0000 mg | ORAL_TABLET | Freq: Every day | ORAL | Status: DC
  Administered 2014-08-16 – 2014-08-17 (×2): 5 mg via ORAL
  Filled 2014-08-16 (×2): qty 1

## 2014-08-16 MED ORDER — LORAZEPAM 1 MG PO TABS: 1.0000 mg | ORAL_TABLET | Freq: Three times a day (TID) | ORAL | Status: DC | PRN

## 2014-08-16 NOTE — BH Assessment (Signed)
Tele Assessment Note   Deanna Valencia is a 18 y.o. female who was voluntarily brought in by [facility] after bystanders saw pt standing on a bridge on [address], attempting to jump.  Upon arrival to emerg dept, pt was upset and crying--"I messed up, I lied to the police and got him in trouble".  Pt says she was informed by boyfriend's family that he may serve receive a 10 yr sentence for charges.  Pt continues to endorse SI and states that she has attempted SI at least 5x's in the past, all by overdose.  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  Pt says her last cutting episode was Aug 2015, she cuts on her arms.    Pt told this writer that was released from jail on 08/15/14 for simple assault charges and is homeless. She served 15 days.  Pt says she's been off her psych meds since 03/2014 because her boyfriend took them from her after she tried to overdose on medications.  Pt admits she smokes marijuana and occasionally drinks alcohol.  She is currently engaged in outpatient services with [facility] and [name]    Axis I: Major depressive disorder, Recurrent episode, Severe; Generalized anxiety disorder Axis II: Deferred Axis III:  Past Medical History  Diagnosis Date  . Depression   . Allergy   . Eating disorder   . UTI (urinary tract infection) 10/03/2013  . Foster care (status) 10/18/2013   Axis IV: other psychosocial or environmental problems, problems related to legal system/crime, problems related to social environment and problems with primary support group Axis V: 31-40 impairment in reality testing  Past Medical History:  Past Medical History  Diagnosis Date  . Depression   . Allergy   . Eating disorder   . UTI (urinary tract infection) 10/03/2013  . Foster care (status) 10/18/2013    History reviewed. No pertinent past surgical history.  Family History: No family history on file.  Social History:   reports that she has been smoking Cigarettes.  She has a .25 pack-year smoking history. She has never used smokeless tobacco. She reports that she drinks alcohol. She reports that she uses illicit drugs (Marijuana).  Additional Social History:  Alcohol / Drug Use Pain Medications: None  Prescriptions: None  Over the Counter: None  History of alcohol / drug use?: Yes Longest period of sobriety (when/how long): None  Negative Consequences of Use: Work / School;Personal relationships;Legal Withdrawal Symptoms: Other (Comment) (No w/d sxs ) Substance #1 Name of Substance 1: Alcohol  1 - Age of First Use: Teens  1 - Amount (size/oz): Varies  1 - Frequency: Occasional  1 - Duration: On-going  1 - Last Use / Amount: 06/2014 Substance #2 Name of Substance 2: Marijuana  2 - Age of First Use: Teens  2 - Amount (size/oz): Varies  2 - Frequency: Varies  2 - Duration: On-going  2 - Last Use / Amount: 07/2014  CIWA: CIWA-Ar BP: 137/74 mmHg Pulse Rate: 54 COWS:    PATIENT STRENGTHS: (choose at least two) Communication skills  Allergies:  Allergies  Allergen Reactions  . Amoxicillin Hives  . Tomato Itching and Rash    Home Medications:  (Not in a hospital admission)  OB/GYN Status:  No LMP recorded.  General Assessment Data Location of Assessment: [facility] Is this a Tele or Face-to-Face Assessment?: Face-to-Face Is this an Initial Assessment or a Re-assessment for  this encounter?: Initial Assessment Living Arrangements: Other (Comment) (Homeless ) Can pt return to current living arrangement?: Yes Admission Status: Voluntary Is patient capable of signing voluntary admission?: Yes Transfer from: [facility] Referral Source: MD  Medical Screening Exam ([place]) Medical Exam completed: No Reason for MSE not completed: Other: (None )  [address] Living Arrangements: Other (Comment) (Homeless ) Name of Psychiatrist: [facility]  Name of Therapist: [facility]   Education Status Is patient currently in school?: No Current Grade: None  Highest grade of school patient has completed: None  Name of school: None  Contact person: None   Risk to self with the past 6 months Suicidal Ideation: Yes-Currently Present Suicidal Intent: Yes-Currently Present Is patient at risk for suicide?: Yes Suicidal Plan?: Yes-Currently Present Specify Current Suicidal Plan: Jump off bridge--[address]  Access to Means: Yes Specify Access to Suicidal Means: Bridge(s)  What has been your use of drugs/alcohol within the last 12 months?: Pt uses thc, alcohol  Previous Attempts/Gestures: Yes How many times?: 5 Other Self Harm Risks: None  Triggers for Past Attempts: Unpredictable Intentional Self Injurious Behavior: Cutting Comment - Self Injurious Behavior: Cutter since age 79 Family Suicide History: No Recent stressful life event(s): Legal Issues (Released from jail 08/15/14; Homeless; Chronicity ) Persecutory voices/beliefs?: No Depression: Yes Depression Symptoms: Loss of interest in usual pleasures;Feeling worthless/self pity;Despondent Substance abuse history and/or treatment for substance abuse?: Yes Suicide prevention information given to non-admitted patients: Not applicable  Risk to Others within the past 6 months Homicidal Ideation: No Thoughts of Harm to Others: No Current Homicidal Intent: No Current Homicidal Plan: No Access to Homicidal Means: No Identified Victim: None History of harm to others?: No Assessment of Violence: None Noted Violent Behavior Description: None  Does patient have access to weapons?: No Criminal Charges Pending?: No Does patient have a court date: No  Psychosis Hallucinations: None noted Delusions: None noted  Mental Status Report Appear/Hygiene: Disheveled;In scrubs Eye Contact: Fair Motor Activity: Unremarkable Speech: Logical/coherent;Soft Level of Consciousness: Alert Mood:  Depressed Affect: Depressed;Flat Anxiety Level: None Thought Processes: Coherent;Relevant Judgement: Impaired Orientation: Person;Time;Place;Situation Obsessive Compulsive Thoughts/Behaviors: None  Cognitive Functioning Concentration: Normal Memory: Recent Intact;Remote Intact IQ: Average Insight: Poor Impulse Control: Poor Appetite: Fair Weight Loss: 0 Weight Gain: 0 Sleep: No Change Total Hours of Sleep: 6 Vegetative Symptoms: None  ADLScreening [facility] Assessment Services) Patient's cognitive ability adequate to safely complete daily activities?: Yes Patient able to express need for assistance with ADLs?: Yes Independently performs ADLs?: Yes (appropriate for developmental age)  Prior Inpatient Therapy Prior Inpatient Therapy: Yes Prior Therapy Dates: 2014,2015 Prior Therapy Facilty/Provider(s): [facility], [facility]  Reason for Treatment: Depression/SI   Prior Outpatient Therapy Prior Outpatient Therapy: Yes Prior Therapy Dates: Current  Prior Therapy Facilty/Provider(s): [facility]  Reason for Treatment: Med Mgt/Therapy   ADL Screening (condition at time of admission) Patient's cognitive ability adequate to safely complete daily activities?: Yes Is the patient deaf or have difficulty hearing?: No Does the patient have difficulty seeing, even when wearing glasses/contacts?: No Does the patient have difficulty concentrating, remembering, or making decisions?: No Patient able to express need for assistance with ADLs?: Yes Does the patient have difficulty dressing or bathing?: No Independently performs ADLs?: Yes (appropriate for developmental age) Does the patient have difficulty walking or climbing stairs?: No Weakness of Legs: None Weakness of Arms/Hands: None  Home Assistive Devices/Equipment Home Assistive Devices/Equipment: None  Therapy Consults (therapy consults require a physician  order) PT Evaluation Needed: No OT Evalulation  Needed: No SLP Evaluation Needed: No Abuse/Neglect Assessment (Assessment to be complete while patient is alone) Physical Abuse: Denies Verbal Abuse: Yes, past (Comment) (Childhood ) Sexual Abuse: Denies Exploitation of patient/patient's resources: Denies Self-Neglect: Denies Values / Beliefs Cultural Requests During Hospitalization: None Spiritual Requests During Hospitalization: None Consults Spiritual Care Consult Needed: No Social Work Consult Needed: No Regulatory affairs officer (For Healthcare) Does patient have an advance directive?: No Would patient like information on creating an advanced directive?: No - patient declined information Nutrition Screen- [facility] Patient's home diet: Regular  Additional Information 1:1 In Past 12 Months?: No CIRT Risk: No Elopement Risk: No Does patient have medical clearance?: Yes     Disposition:  Disposition Initial Assessment Completed for this Encounter: Yes Disposition of Patient: Referred to [facility] recommend inpt admission ) Patient referred to: Other (Comment) [facility], [place] recommend inpt admission)  [place] Rm 08/16/2014 8:05 AM

## 2014-08-16 NOTE — ED Notes (Signed)
2 pt belonging bags in locker #28

## 2014-08-16 NOTE — ED Notes (Addendum)
Patient presents guarded, sad with blunted affect; passive SI; does not confirm or deny SI just states "I don't know"; patient endorses hopelessness and not wanting to be here; patient states that she is not having any AVH "at this time". NAD

## 2014-08-16 NOTE — Consult Note (Signed)
Face to face evaluation and I agree with this note 

## 2014-08-16 NOTE — ED Notes (Signed)
Psych at bedside.

## 2014-08-16 NOTE — ED Provider Notes (Signed)
CSN: [id number]     Arrival date & time 08/15/14  2210 History  This chart was scribed for non-physician practitioner, Alvina Chou, PA-C working with Varney Biles, MD by Evelene Croon, ED Scribe. This patient was seen in room [facility] and the patient's care was started at 12:02 AM.    Chief Complaint  Patient presents with  . Suicidal     HPI  HPI Comments:  Deanna Valencia is a 18 y.o. female who presents to the Emergency Department brought in by [employer]. Pt was found standing on a bridge threatening to jump, bystanders called 911. Pt states she was released from jail today. She denies illicit drug use but admits to alcohol consumption today. Pt has no physical complaints at this time but when asked if she had SI she shrugged.   Past Medical History  Diagnosis Date  . Depression   . Allergy   . Eating disorder   . UTI (urinary tract infection) 10/03/2013  . Foster care (status) 10/18/2013   History reviewed. No pertinent past surgical history. No family history on file. History  Substance Use Topics  . Smoking status: Light Tobacco Smoker -- 0.25 packs/day for 1 years    Types: Cigarettes  . Smokeless tobacco: Never Used     Comment: states smokes occasionally not daily  . Alcohol Use: Yes     Comment: occasionally   OB History   Grav Para Term Preterm Abortions TAB SAB Ect Mult Living                 Review of Systems  Psychiatric/Behavioral: Positive for suicidal ideas and behavioral problems.  All other systems reviewed and are negative.     Allergies  Amoxicillin and Tomato  Home Medications   Prior to Admission medications   Not on File   BP 129/75  Pulse 70  Temp(Src) 97.9 F (36.6 C) (Oral)  Resp 18  SpO2 100% Physical Exam  Nursing note and vitals reviewed. Constitutional: She appears well-developed and well-nourished. No distress.  HENT:  Head: Normocephalic and atraumatic.  Eyes: Conjunctivae are normal.  Neck: Normal range of  motion.  Cardiovascular: Normal rate and regular rhythm.   Pulmonary/Chest: Effort normal and breath sounds normal.  Abdominal: She exhibits no distension.  Musculoskeletal: Normal range of motion.  Neurological: She is alert.  Skin: Skin is warm and dry.  Psychiatric:  Flat Affect  Dysphoric mood    ED Course  Procedures   DIAGNOSTIC STUDIES:  Oxygen Saturation is 100% on RA, normal by my interpretation.    COORDINATION OF CARE:  12:10 AM Discussed treatment plan with pt at bedside and pt agreed to plan.  Labs Review Labs Reviewed  URINE RAPID DRUG SCREEN (HOSP PERFORMED) - Abnormal; Notable for the following:    Tetrahydrocannabinol POSITIVE (*)    All other components within normal limits  CBC  ACETAMINOPHEN LEVEL  COMPREHENSIVE METABOLIC PANEL  ETHANOL  SALICYLATE LEVEL    Imaging Review No results found.   EKG Interpretation None      MDM   Final diagnoses:  Suicidal ideation    Patient's labs pending.   I personally performed the services described in this documentation, which was scribed in my presence. The recorded information has been reviewed and is accurate.   Alvina Chou, PA-C 08/16/14 2139
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
## 2014-08-16 NOTE — ED Notes (Signed)
According to psych, inpatient treatment.

## 2014-08-16 NOTE — BH Assessment (Signed)
TTS received call from [place] at [facility] inquiring about mention of eating disorder in patient's chart. Patient psych consult does note 'eating disorder' under Past Medical History. Consulted with RN who reports patient has been eating normal for me today.' Call made back to [place] at [phone] and nursing report given to [facility], who reports they will review for possible placement.  Sheilah Pigeon, LCSW

## 2014-08-17 ENCOUNTER — Inpatient Hospital Stay (HOSPITAL_COMMUNITY)
Admission: AD | Admit: 2014-08-17 | Discharge: 2014-08-25 | DRG: 885 | Disposition: A | Discharge status: Home / Self Care | Payer: Federal, State, Local not specified - PPO | Source: Intra-hospital | Attending: Psychiatry | Admitting: Psychiatry

## 2014-08-17 ENCOUNTER — Encounter (HOSPITAL_COMMUNITY): Payer: Self-pay | Admitting: Behavioral Health

## 2014-08-17 DIAGNOSIS — R45851 Suicidal ideations: Secondary | ICD-10-CM | POA: Diagnosis present

## 2014-08-17 DIAGNOSIS — F603 Borderline personality disorder: Secondary | ICD-10-CM | POA: Diagnosis present

## 2014-08-17 DIAGNOSIS — F1290 Cannabis use, unspecified, uncomplicated: Secondary | ICD-10-CM | POA: Diagnosis present

## 2014-08-17 DIAGNOSIS — G4700 Insomnia, unspecified: Secondary | ICD-10-CM | POA: Diagnosis present

## 2014-08-17 DIAGNOSIS — F17210 Nicotine dependence, cigarettes, uncomplicated: Secondary | ICD-10-CM | POA: Diagnosis present

## 2014-08-17 DIAGNOSIS — Z590 Homelessness: Secondary | ICD-10-CM | POA: Diagnosis not present

## 2014-08-17 DIAGNOSIS — F331 Major depressive disorder, recurrent, moderate: Secondary | ICD-10-CM | POA: Diagnosis present

## 2014-08-17 DIAGNOSIS — F329 Major depressive disorder, single episode, unspecified: Secondary | ICD-10-CM | POA: Diagnosis present

## 2014-08-17 DIAGNOSIS — F909 Attention-deficit hyperactivity disorder, unspecified type: Secondary | ICD-10-CM | POA: Diagnosis present

## 2014-08-17 DIAGNOSIS — F32A Depression, unspecified: Secondary | ICD-10-CM | POA: Diagnosis present

## 2014-08-17 LAB — PREGNANCY, URINE: PREG TEST UR: NEGATIVE

## 2014-08-17 MED ORDER — OLANZAPINE 5 MG PO TBDP
5.0000 mg | ORAL_TABLET | Freq: Four times a day (QID) | ORAL | Status: DC | PRN
  Administered 2014-08-17: 5 mg via ORAL
  Filled 2014-08-17: qty 1

## 2014-08-17 MED ORDER — MAGNESIUM HYDROXIDE 400 MG/5ML PO SUSP: 30.0000 mL | Freq: Every day | ORAL | Status: DC | PRN

## 2014-08-17 MED ORDER — ALUM & MAG HYDROXIDE-SIMETH 200-200-20 MG/5ML PO SUSP
30.0000 mL | ORAL | Status: DC | PRN
Start: 2014-08-17 — End: 2014-08-25

## 2014-08-17 MED ORDER — ACETAMINOPHEN 325 MG PO TABS: 650.0000 mg | ORAL_TABLET | Freq: Four times a day (QID) | ORAL | Status: DC | PRN

## 2014-08-17 MED ORDER — TRAZODONE HCL 50 MG PO TABS
50.0000 mg | ORAL_TABLET | Freq: Every evening | ORAL | Status: DC | PRN
  Administered 2014-08-17: 50 mg via ORAL
  Filled 2014-08-17: qty 1

## 2014-08-17 NOTE — ED Notes (Signed)
[facility] contacted for transport 
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
## 2014-08-17 NOTE — BH Assessment (Signed)
Patient accepted to [facility] by Dr. Lovena Le and Earleen Newport, NP. The attending provider at [facility] is Dr. Shea Evans. The room assignment is 407-1. Support paperwork completed. Nursing report [phone].

## 2014-08-17 NOTE — Progress Notes (Signed)
D:  Patient denied SI and HI, contracts for safety.   Denied A/V hallucinations.  Denied pain.  A:  Emotional support and encouragement given patient.  R:  Safety maintained with 15 minute checks.

## 2014-08-17 NOTE — ED Notes (Addendum)
Pt ambulatory w/o difficulty to [facility] w/ [facility].  Belongings sent w/ driver.

## 2014-08-17 NOTE — Tx Team (Signed)
Initial Interdisciplinary Treatment Plan   PATIENT STRESSORS: Marital or family conflict Occupational concerns Substance abuse   PROBLEM LIST: Problem List/Patient Goals Date to be addressed Date deferred Reason deferred Estimated date of resolution  Suicidal Ideation 08/17/14     Auditory hallucinations 08/17/14     Substance abuse 08/17/14                                          DISCHARGE CRITERIA:  Ability to meet basic life and health needs Improved stabilization in mood, thinking, and/or behavior Motivation to continue treatment in a less acute level of care  PRELIMINARY DISCHARGE PLAN: Attend aftercare/continuing care group Outpatient therapy  PATIENT/FAMIILY INVOLVEMENT: This treatment plan has been presented to and reviewed with the patient, Deanna Valencia.  The patient and family have been given the opportunity to ask questions and make suggestions.  Eduard Roux E 08/17/2014, 3:18 PM

## 2014-08-17 NOTE — Progress Notes (Signed)
Admission Note  D: Patient admitted to [facility] from [facility]. Patient presents with flat, depressed affect and mood. She reported that prior to coming into the hospital she had thoughts to jump off a bridge; also hearing voices, one saying don't do it and the other voice telling her to jump.  A: Encouragement and support given to patient. Oriented patient to the unit and informed her of the unit rules/policies. Initiated Q15 minute checks for safety.  R: Patient receptive. At this time, the patient denies SI/HI and AVH. Patient remains safe on the unit.

## 2014-08-17 NOTE — ED Notes (Signed)
Dr Ronal Fear and Delphia Grates NP into see
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
## 2014-08-18 ENCOUNTER — Encounter (HOSPITAL_COMMUNITY): Payer: Self-pay | Admitting: Psychiatry

## 2014-08-18 DIAGNOSIS — F331 Major depressive disorder, recurrent, moderate: Principal | ICD-10-CM

## 2014-08-18 DIAGNOSIS — R45851 Suicidal ideations: Secondary | ICD-10-CM

## 2014-08-18 DIAGNOSIS — F603 Borderline personality disorder: Secondary | ICD-10-CM

## 2014-08-18 DIAGNOSIS — F1290 Cannabis use, unspecified, uncomplicated: Secondary | ICD-10-CM

## 2014-08-18 LAB — HEMOGLOBIN A1C
HEMOGLOBIN A1C: 5.2 % (ref <5.7)
Mean Plasma Glucose: 103 mg/dL (ref <117)

## 2014-08-18 LAB — LIPID PANEL
Cholesterol: 143 mg/dL (ref 0–200)
HDL: 56 mg/dL (ref >39)
LDL CALC: 72 mg/dL (ref 0–99)
Total CHOL/HDL Ratio: 2.6 RATIO
Triglycerides: 76 mg/dL (ref <150)
VLDL: 15 mg/dL (ref 0–40)

## 2014-08-18 MED ORDER — HYDROXYZINE HCL 25 MG PO TABS
25.0000 mg | ORAL_TABLET | Freq: Three times a day (TID) | ORAL | Status: DC | PRN
  Administered 2014-08-18 – 2014-08-24 (×6): 25 mg via ORAL
  Filled 2014-08-18 (×2): qty 1
  Filled 2014-08-18: qty 10
  Filled 2014-08-18 (×4): qty 1

## 2014-08-18 MED ORDER — QUETIAPINE FUMARATE ER 50 MG PO TB24
50.0000 mg | ORAL_TABLET | Freq: Every day | ORAL | Status: DC
  Administered 2014-08-18 – 2014-08-19 (×2): 50 mg via ORAL
  Filled 2014-08-18 (×3): qty 1

## 2014-08-18 MED ORDER — SERTRALINE HCL 25 MG PO TABS
25.0000 mg | ORAL_TABLET | Freq: Every day | ORAL | Status: DC
Start: 2014-08-18 — End: 2014-08-20
  Administered 2014-08-18 – 2014-08-20 (×3): 25 mg via ORAL
  Filled 2014-08-18 (×5): qty 1

## 2014-08-18 NOTE — Progress Notes (Addendum)
Patient ID: Deanna Valencia, female   DOB: [birth year], 18 y.o.   MRN: [id number] D: Client is visible on the unit. Client express concern that she may not having housing when she is discharged. "I called housing authority and they said they sent me a form and I ned to check my face book to see if it came" "I gave my sister the pass code an told her to look for the forms, but she wont do it, she don't do anything for anybody except it's for her benefit" R: Writer provided emotional support, encouraged her to try to speak with sister tomorrow. Also encouraged her to ask CM for some resources. Staff will monitor q54mins for safety. R: Client is safe on the  Unit, attended group.
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
## 2014-08-18 NOTE — BHH Suicide Risk Assessment (Signed)
   Nursing information obtained from:    Demographic factors:    Current Mental Status:    Loss Factors:    Historical Factors:    Risk Reduction Factors:    Total Time spent with patient: 30 minutes  CLINICAL FACTORS:   Depression:   Aggression Hopelessness Previous Psychiatric Diagnoses and Treatments  Psychiatric Specialty Exam: Physical Exam  ROS  Blood pressure 124/77, pulse 60, temperature 97.7 F (36.5 C), temperature source Oral, resp. rate 18, height 5\' 1"  (1.549 m), weight 78.472 kg (173 lb), last menstrual period 08/04/2014.Body mass index is 32.7 kg/(m^2).  Please see H&P FOR MSE     SUICIDE RISK:   Moderate:  Frequent suicidal ideation with limited intensity, and duration, some specificity in terms of plans, no associated intent, good self-control, limited dysphoria/symptomatology, some risk factors present, and identifiable protective factors, including available and accessible social support.  PLAN OF CARE:Please see H&P.   I certify that inpatient services furnished can reasonably be expected to improve the patient's condition.  Yesli Vanderhoff md 08/18/2014, 2:18 PM

## 2014-08-18 NOTE — ED Provider Notes (Signed)
Medical screening examination/treatment/procedure(s) were performed by non-physician practitioner and as supervising physician I was immediately available for consultation/collaboration.   EKG Interpretation None       Varney Biles, MD 08/18/14 1021

## 2014-08-18 NOTE — BHH Group Notes (Signed)
[facility] LCSW Group Therapy  08/18/2014  1:05 PM  Type of Therapy:  Group therapy  Participation Level:  Active  Participation Quality:  Attentive  Affect:  Flat  Cognitive:  Oriented  Insight:  Limited  Engagement in Therapy:  Limited  Modes of Intervention:  Discussion, Socialization  Summary of Progress/Problems:  Chaplain was here to lead a group on themes of hope and courage.  "I don't know."  Shrugged and smiled.  Engaged her neighbor several times in side conversation. "I am not able to find courage."  Catalina Antigua talked to her about her boyfriend, which brought a smile and laughter.  But then she admitted that being with him "gives me courage to do negative things, and that is not helpful."  Went on to talk about siblings-little brother and sister-who make her optimistic.  Unfortunately, they are in [place], so interaction is limited.  Has a fantasy about moving closer, but admitted that it is not a likely scenario.  Leader engaged her about a negative relationship that she admitted that she is in.  She admitted as such, but also expressed difficulty in disengaging.  Roque Lias B 08/18/2014 11:22 AM
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
## 2014-08-18 NOTE — BHH Group Notes (Signed)
[facility] LCSW Aftercare Discharge Planning Group Note   08/18/2014 11:20 AM  Participation Quality:  Engaged  Mood/Affect:  Flat  Depression Rating:  10  Anxiety Rating:  10  Thoughts of Suicide:  No Will you contract for safety?   NA  Current AVH:  No  Plan for Discharge/Comments:  Admits that she was on a bridge contemplating suicide.  "I just got out of jail, and I don't have any place to go."  Non-compliant with meds out side of the hospital.  "They only make it worse."  States she sees a therapist at [place].  Wants to know if I can get her into housing.  Transportation Means: bus  Supports: none identified  [place], [place] B

## 2014-08-18 NOTE — Plan of Care (Signed)
Problem: Ineffective individual coping Goal: STG: Patient will remain free from self harm Outcome: Progressing Thus far patient has not engaged in any self harm behaviors and presently denies SI.  Problem: Alteration in thought process Goal: STG-Patient is able to discuss thoughts with staff Outcome: Not Progressing Patient forwards minimal information. Appears superficial in her answers and denies all psychiatric symptoms.

## 2014-08-18 NOTE — Progress Notes (Signed)
Spoke with patient 1:1 who is visible on the unit but interacts minimally. She is flat, depressed but brightens slightly with interaction. Pt states since restarting meds, she is free of AVH. Pt supported and reassured. Meds given without complication and med education provided. Discussed plan of care with Dr. Shea Evans and treatment team. She denies SI/HI and remains safe. Jamie Kato

## 2014-08-18 NOTE — BHH Suicide Risk Assessment (Signed)
Deanna Valencia INPATIENT:  Family/Significant Other Suicide Prevention Education  Suicide Prevention Education:  Contact Attempts: Carollee Leitz, pt's mother [phone]) has been identified by the patient as the family member/significant other with whom the patient will be residing, and identified as the person(s) who will aid the patient in the event of a mental health crisis.  With written consent from the patient, two attempts were made to provide suicide prevention education, prior to and/or following the patient's discharge.  We were unsuccessful in providing suicide prevention education.  A suicide education pamphlet was given to the patient to share with family/significant other.  Date and time of first attempt: 3:00PM (voicemail left requesting call back)  Smart, Taylia Berber LCSWA 08/18/2014, 3:00 PM  Ms. Mancel Bale called back. SPE completed. Ms. Mancel Bale verbalized understanding of information and was encouraged to call with any further questions, concerns, or updates.  [place], [place] 08/18/2014 3:11 PM

## 2014-08-18 NOTE — Clinical Social Work Note (Signed)
CSW left message for Karie Soda at [phone] ([facility] for women 16-21) to inquire about wait-list and application. Also called [phone] [place] location-no answer/generic voicemail. Did not leave message as mailbox was not secure.   [place], [place] 08/18/2014 2:59 PM

## 2014-08-18 NOTE — Progress Notes (Signed)
[facility] Group Notes:  (Nursing/MHT/Case Management/Adjunct)  Date:  08/18/2014  Time:  11:19 PM  Type of Therapy:  Group Therapy  Participation Level:  Active  Participation Quality:  Intrusive, Redirectable and Sharing  Affect:  Appropriate and Excited  Cognitive:  Appropriate  Insight:  Improving  Engagement in Group:  Developing/Improving  Modes of Intervention:  Socialization and Support  Summary of Progress/Problems: Pt. Participated in group discussion.  Pt. Was unable to list positive coping skills for relapse prevention.  Pt. Stated she would like to learn new coping skills for relapse prevention.  Lanell Persons 08/18/2014, 11:19 PM

## 2014-08-19 DIAGNOSIS — F909 Attention-deficit hyperactivity disorder, unspecified type: Secondary | ICD-10-CM

## 2014-08-19 NOTE — Plan of Care (Signed)
Problem: Ineffective individual coping Goal: STG-Increase in ability to manage activities of daily living Outcome: Completed/Met Date Met:  08/19/14 Pt. capable of performing self care needs independently. ADLs done this shift without prompts from staff.

## 2014-08-19 NOTE — Progress Notes (Signed)
The focus of this group is to help patients review their daily goal of treatment and discuss progress on daily workbooks. Pt attended the evening group session and responded to all discussion prompts from the [place]. Pt told the Writer she had a good day on the unit, the highlight of which was talking to her 19yo sister on the phone. She also shared with the group that her favorite coping skill at home when things are going bad was to "walk away from whatever is upsetting me. It usually works so long as they don't keep talking as I try to walk away." Pt's affect was appropriate.

## 2014-08-19 NOTE — Progress Notes (Signed)
[facility] MD Progress Note  08/19/2014 11:43 AM Deanna Valencia  MRN:  [id number] Subjective: Patient states " I am ok ,I still feel I am not ready to be discharged" Objective: Patient states she is feeling depressed and anxious. Patient continue to deny current SI ,but reports she would if she is has to go outside. Patient denies any HI/AH/VH.Patient continues to have periodic irritability as well as anxiety. Patient has been compliant with medications.  Diagnosis:   DSM5: Primary Psychiatric Diagnosis:  Major depressive disorder,recurrent moderate   Secondary Psychiatric Diagnosis:  Cannabis use disorder  Borderline personality disorder  Hx of ADHD   Non Psychiatric Diagnosis:  None         Total Time spent with patient: 20 minutes   ADL's:  Intact  Sleep: Fair  Appetite:  Fair   Psychiatric Specialty Exam: Physical Exam  ROS  Blood pressure 105/54, pulse 108, temperature 98.3 F (36.8 C), temperature source Oral, resp. rate 16, height 5\' 1"  (1.549 m), weight 78.472 kg (173 lb), last menstrual period 08/04/2014.Body mass index is 32.7 kg/(m^2).  General Appearance: Casual  Eye Contact::  Fair  Speech:  Normal Rate  Volume:  Normal  Mood:  Anxious and Depressed  Affect:  Appropriate  Thought Process:  Irrelevant  Orientation:  Full (Time, Place, and Person)  Thought Content:  WDL  Suicidal Thoughts:  No  Homicidal Thoughts:  No  Memory:  Immediate;   Fair Recent;   Fair Remote;   Fair  Judgement:  Fair  Insight:  Lacking  Psychomotor Activity:  Normal  Concentration:  Fair  Recall:  [employer] of Knowledge:Fair  Language: Good  Akathisia:  No    AIMS (if indicated):     Assets:  Communication Skills Desire for Improvement  Sleep:  Number of Hours: 6.75   Musculoskeletal: Strength & Muscle Tone: within normal limits Gait & Station: normal Patient leans: N/A  Current Medications: Current Facility-Administered Medications  Medication Dose Route  Frequency Provider Last Rate Last Dose  . acetaminophen (TYLENOL) tablet 650 mg  650 mg Oral Q6H PRN Ursula Alert, MD      . alum & mag hydroxide-simeth (MAALOX/MYLANTA) 200-200-20 MG/5ML suspension 30 mL  30 mL Oral Q4H PRN Ursula Alert, MD      . hydrOXYzine (ATARAX/VISTARIL) tablet 25 mg  25 mg Oral TID PRN Ursula Alert, MD   25 mg at 08/18/14 1137  . magnesium hydroxide (MILK OF MAGNESIA) suspension 30 mL  30 mL Oral Daily PRN Ursula Alert, MD      . OLANZapine zydis (ZYPREXA) disintegrating tablet 5 mg  5 mg Oral Q6H PRN Ursula Alert, MD   5 mg at 08/17/14 2200  . QUEtiapine (SEROQUEL XR) 24 hr tablet 50 mg  50 mg Oral QHS Ursula Alert, MD   50 mg at 08/18/14 2129  . sertraline (ZOLOFT) tablet 25 mg  25 mg Oral Daily Ursula Alert, MD   25 mg at 08/19/14 0865    Lab Results:  Results for orders placed during the hospital encounter of 08/17/14 (from the past 48 hour(s))  PREGNANCY, URINE     Status: None   Collection Time    08/17/14  5:49 PM      Result Value Ref Range   Preg Test, Ur NEGATIVE  NEGATIVE   Comment:            THE SENSITIVITY OF THIS     METHODOLOGY IS >20 mIU/mL.     Performed at  [facility]  HEMOGLOBIN A1C     Status: None   Collection Time    08/18/14  6:46 AM      Result Value Ref Range   Hemoglobin A1C 5.2  <5.7 %   Comment: (NOTE)                                                                               According to the ADA Clinical Practice Recommendations for 2011, when     HbA1c is used as a screening test:      >=6.5%   Diagnostic of Diabetes Mellitus               (if abnormal result is confirmed)     5.7-6.4%   Increased risk of developing Diabetes Mellitus     References:Diagnosis and Classification of Diabetes Mellitus,Diabetes     HQIO,9629,52(WUXLK 1):S62-S69 and Standards of Medical Care in             Diabetes - 2011,Diabetes Care,2011,34 (Suppl 1):S11-S61.   Mean Plasma Glucose 103  <117 mg/dL   Comment:  Performed at [facility]: None   Collection Time    08/18/14  6:46 AM      Result Value Ref Range   Cholesterol 143  0 - 200 mg/dL   Triglycerides 76  <150 mg/dL   HDL 56  >39 mg/dL   Total CHOL/HDL Ratio 2.6     VLDL 15  0 - 40 mg/dL   LDL Cholesterol 72  0 - 99 mg/dL   Comment:            Total Cholesterol/HDL:CHD Risk     Coronary Heart Disease Risk Table                         Men   Women      1/2 Average Risk   3.4   3.3      Average Risk       5.0   4.4      2 X Average Risk   9.6   7.1      3 X Average Risk  23.4   11.0                Use the calculated Patient Ratio     above and the CHD Risk Table     to determine the patient's CHD Risk.                ATP III CLASSIFICATION (LDL):      <100     mg/dL   Optimal      100-129  mg/dL   Near or Above                        Optimal      130-159  mg/dL   Borderline      160-189  mg/dL   High      >190     mg/dL   Very High     Performed at [facility]    Physical Findings:  AIMS: Facial and Oral Movements Muscles of Facial Expression: None, normal Lips and Perioral Area: None, normal Jaw: None, normal Tongue: None, normal,Extremity Movements Upper (arms, wrists, hands, fingers): None, normal Lower (legs, knees, ankles, toes): None, normal, Trunk Movements Neck, shoulders, hips: None, normal, Overall Severity Severity of abnormal movements (highest score from questions above): None, normal Incapacitation due to abnormal movements: None, normal Patient's awareness of abnormal movements (rate only patient's report): No Awareness, Dental Status Current problems with teeth and/or dentures?: No Does patient usually wear dentures?: No  CIWA:    COWS:     Treatment Plan Summary: Daily contact with patient to assess and evaluate symptoms and progress in treatment Medication management  Plan: Patient will continue to benefit from inpatient stay. Estimated length of stay is 5-7  days.  Reviewed past medical records,treatment plan.  Will continue the patient on Seroquel XR 50 mg po qhs.  Will continue Zoloft 25 mg po daily.  Zyprexa Zydis 5 mg po qhs prn for agitation.  Will continue to monitor vitals ,medication compliance and treatment side effects while patient is here.  Will monitor for medical issues as well as call consult as needed.  Reviewed labs-08/18/14 -Hb a1c,lipid panel wnl. CSW will start working on disposition.  Patient to participate in therapeutic milieu .       Medical Decision Making Problem Points:  Established problem, stable/improving (1) Data Points:  Review of medication regiment & side effects (2)  I certify that inpatient services furnished can reasonably be expected to improve the patient's condition.   Tryton Bodi MD 08/19/2014, 11:43 AM

## 2014-08-19 NOTE — Progress Notes (Signed)
D Pt. Denies SI and HI, no complaints of pain or discomfort noted at this time.  A  Writer offered support and encouragement, discussed coping skills with pt.  R Pt. Remains safe on the unit, rates her depression a 4 and her anxiety a 6.  Pt. Reports her main concern is getting back into school, stating she is studying psychology through [employer] and is concerned they will drop her due to her inpatient at [facility].  Pt. Then reports that she has had multiple admissions to [facility] starting out on the child adolescent unit.

## 2014-08-19 NOTE — BHH Group Notes (Signed)
[place] Group Notes:  (Clinical Social Work)  08/19/2014  11:15-12:00PM  Summary of Progress/Problems:   The main focus of today's process group was to discuss patients' feelings about hospitalization, the stigma attached to mental health, and sources of motivation to stay well.  We then worked to identify a specific plan to avoid future hospitalizations when discharged from the hospital for this admission.  The patient expressed that people tend not to understand mental health diagnoses.  She was late to group, but joined in with the topic very well.  Type of Therapy:  Group Therapy - Process  Participation Level:  Active  Participation Quality:  Sharing  Affect:  Blunted  Cognitive:  Alert and Appropriate  Insight:  Developing/Improving  Engagement in Therapy:  Engaged  Modes of Intervention:  Exploration, Discussion  Selmer Dominion, LCSW 08/19/2014, 12:55 PM

## 2014-08-19 NOTE — Progress Notes (Signed)
D: Pt. alert and oriented X 4 this shift. Did not attend unit group as scheduled. OOB for lunch, affect congruent. Denied A/V hallucinations when assessed. Denied pain. Contracts for safety while in hospital. Rated her depression (homelessness) 5/10, hopelessness 5/10 on self inventory sheet. Mood remains labile, pt. Observed yelling and cursing on phone, "you stupid ass, you don't shift, you can't do shit to help me". Reported she was talking to her father. Redirection offered, pt. stated she will not call her father again.  A: Medication given as scheduled. Q 15 minutes checks maintained for safety. Support and availability offered. R: Pt. took her medication when offered. Verbally interactive with staff when approached. Receptive to care without physical outburst thus far this shift. Self care needs (ADLs) met. Pt's goal for today on self inventory sheet was to "learn how not to lash out at people".

## 2014-08-19 NOTE — Progress Notes (Signed)
Patient ID: Deanna Valencia, female   DOB: Jun 21, 1996, 18 y.o.   MRN: [id number] Psychoeducational Group Note  Date:  08/19/2014 Time:0930am  Group Topic/Focus:  Identifying Needs:   The focus of this group is to help patients identify their personal needs that have been historically problematic and identify healthy behaviors to address their needs.  Participation Level:  Did Not Attend  Participation Quality:    Affect:  Cognitive:   Insight:  Engagement in Group:  Additional Comments:  Inventory group   Pricilla Larsson 08/19/2014,10:30 AM

## 2014-08-19 NOTE — Progress Notes (Signed)
Patient ID: [name], female   DOB: 1996-01-05, 18 y.o.   MRN: [id number] Psychoeducational Group Note  Date:  08/19/2014 Time:1000am  Group Topic/Focus:  Identifying Needs:   The focus of this group is to help patients identify their personal needs that have been historically problematic and identify healthy behaviors to address their needs.  Participation Level:  Did Not Attend  Participation Quality:    Affect:  Cognitive:   Insight:  Engagement in Group:   Additional Comments:  Healthy coping skills.   Pricilla Larsson 08/19/2014,10:31 AM

## 2014-08-20 MED ORDER — QUETIAPINE FUMARATE ER 50 MG PO TB24
100.0000 mg | ORAL_TABLET | Freq: Every day | ORAL | Status: DC
  Administered 2014-08-20 – 2014-08-23 (×4): 100 mg via ORAL
  Filled 2014-08-20 (×5): qty 2

## 2014-08-20 MED ORDER — SERTRALINE HCL 50 MG PO TABS
50.0000 mg | ORAL_TABLET | Freq: Every day | ORAL | Status: DC
  Administered 2014-08-21 – 2014-08-24 (×4): 50 mg via ORAL
  Filled 2014-08-20 (×5): qty 1

## 2014-08-20 NOTE — BHH Group Notes (Signed)
[facility] Group Notes:  (Nursing/MHT/Case Management/Adjunct)  Date:  08/20/2014  Time: 0930   Type of Therapy:  Nurse Education  Participation Level:  Did Not Attend  Participation Quality:    Affect:    Cognitive:    Insight:    Engagement in Group:    Modes of Intervention:    Summary of Progress/Problems:  Marissa Calamity 08/20/2014, 1:54 PM

## 2014-08-20 NOTE — Progress Notes (Signed)
D: Pt presents with minimal engagement on the unit. Pt verbalized little depression this morning. Pt rates her depression 3/10. Pt reports difficulty falling asleep last night. Pt stated that she took medicine prior to going to bed, but didn't fall asleep until 2:00 am this morning. Pt denies SI/HI/AVH. Pt a little irritated with other pts on the 300 hall, because they have been calling pts on the 400 hall crazy. Writer encouraged pt to talk to staff if the situation arises again in the cafeteria. Pt agreed to talk to staff.  A: Medications administered as ordered per MD. Meds verified by writer. Verbal support given. Pt encouraged to attend groups. 15 minute checks performed for safety.  R: Pt safety maintained.

## 2014-08-20 NOTE — Progress Notes (Signed)
D: Patient in the dayroom on approach.  Patient states she had a good day.  Patient states she has been attending groups.  Patient states she was able to make phone calls today and talk to the people she needed to talk to.  Patient states she had difficulty sleeping last night so she hopes her medication increase will help tonight.  Patient denies SI/HI and denies AVH. A: Staff to monitor Q 15 mins for safety.  Encouragement and support offered.  Scheduled medications administered per orders.  Vistaril administered prn for anxiety. R: Patient remains safe on the unit.  Patient attended group tonight.  Patient visible on the unit and interacting with peers.  Patient taking administered medications.

## 2014-08-20 NOTE — BHH Group Notes (Signed)
[facility] Group Notes: (Clinical Social Work)   08/20/2014      Type of Therapy:  Group Therapy   Participation Level:  Did Not Attend - declined    Selmer Dominion, LCSW 08/20/2014, 1:41 PM
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
## 2014-08-20 NOTE — Progress Notes (Signed)
Adult Psychoeducational Group Note  Date:  08/20/2014 Time:  9:32 PM  Group Topic/Focus:  Wrap-Up Group:   The focus of this group is to help patients review their daily goal of treatment and discuss progress on daily workbooks.  Participation Level:  Minimal  Participation Quality:  Appropriate  Affect:  Appropriate  Cognitive:  Appropriate  Insight: Limited  Engagement in Group:  Limited  Modes of Intervention:  Socialization and Support  Additional Comments:  Patient attended and participated in group tonight. She reports that her day was good. She sleep for most of the day due to a new medication she is on. She did went down for her meals and attended her groups. She advised that her mother is her support.  Salley Scarlet [facility] 08/20/2014, 9:32 PM
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
## 2014-08-21 NOTE — Progress Notes (Signed)
Patient ID: Deanna Valencia, female   DOB: 1996/08/08, 18 y.o.   MRN: [id number] [facility] MD Progress Note  08/21/2014 4:59 PM Deanna Valencia  MRN:  [id number] Subjective:  Patient reports she is " about the same " Objective:  Patient reports a series of significant psychosocial stressors- boyfriend now in jail for recent crime. Patient thinks he did not do it. She is homeless and describes a poor support network. She had been enrolled in college at one time but currently not. She recently was incarcerated due to domestic violence allegations. States she is concerned she might be in danger from gang related activity. As per chart notes, patient is improved, and behavior is in good control. She is visible in day room and participative in milieu At this time she endorses anxiety more than depression, but does become tearful when discussing her stressors and her perception that her father does not care about her. Patient has a history of self cutting but denies any recent self injurious behaviors or ruminations to hurt self.  Not endorsing medication side effects at present.  Diagnosis:   DSM5: Primary Psychiatric Diagnosis:  Major depressive disorder,recurrent moderate   Secondary Psychiatric Diagnosis:  Cannabis use disorder  Borderline personality disorder  Hx of ADHD   Non Psychiatric Diagnosis:  None   Total Time spent with patient: 20 minutes   ADL's: fair   Sleep: Good  Appetite: improving    Psychiatric Specialty Exam: Physical Exam  ROS  Blood pressure 141/78, pulse 59, temperature 98.2 F (36.8 C), temperature source Oral, resp. rate 18, height 5\' 1"  (1.549 m), weight 78.472 kg (173 lb), last menstrual period 08/04/2014.Body mass index is 32.7 kg/(m^2).  General Appearance: Fairly Groomed  Engineer, water::  Good  Speech:  Normal Rate  Volume:  Normal  Mood:  Anxious and Depressed  Affect:  reactive, but does become tearful at times   Thought Process:  Linear   Orientation:  Full (Time, Place, and Person)  Thought Content:  Rumination- ruminates about stressors as noted above. At this time denies hallucinations and does not appear internally preoccupied   Suicidal Thoughts:  No,patient currently denies any plan or intention of hurting self and contracts for safety on the unit.   Homicidal Thoughts:  No  Memory:  Recent and remote grossly intact   Judgement:  Fair  Insight:  Fair  Psychomotor Activity:  Normal  Concentration:  Good  Recall:  Good  Fund of Knowledge:Good  Language: Good  Akathisia:  No    AIMS (if indicated):     Assets:  Communication Skills Desire for Improvement  Sleep:  Number of Hours: 6.25   Musculoskeletal: Strength & Muscle Tone: within normal limits Gait & Station: normal Patient leans: N/A  Current Medications: Current Facility-Administered Medications  Medication Dose Route Frequency Provider Last Rate Last Dose  . acetaminophen (TYLENOL) tablet 650 mg  650 mg Oral Q6H PRN Ursula Alert, MD      . alum & mag hydroxide-simeth (MAALOX/MYLANTA) 200-200-20 MG/5ML suspension 30 mL  30 mL Oral Q4H PRN Ursula Alert, MD      . hydrOXYzine (ATARAX/VISTARIL) tablet 25 mg  25 mg Oral TID PRN Ursula Alert, MD   25 mg at 08/20/14 2210  . magnesium hydroxide (MILK OF MAGNESIA) suspension 30 mL  30 mL Oral Daily PRN Saramma Eappen, MD      . OLANZapine zydis (ZYPREXA) disintegrating tablet 5 mg  5 mg Oral Q6H PRN Ursula Alert, MD   5  mg at 08/17/14 2200  . QUEtiapine (SEROQUEL XR) 24 hr tablet 100 mg  100 mg Oral QHS Saramma Eappen, MD   100 mg at 08/20/14 2210  . sertraline (ZOLOFT) tablet 50 mg  50 mg Oral Daily Saramma Eappen, MD   50 mg at 08/21/14 0820    Lab Results:  No results found for this or any previous visit (from the past 33 hour(s)).  Physical Findings: AIMS: Facial and Oral Movements Muscles of Facial Expression: None, normal Lips and Perioral Area: None, normal Jaw: None, normal Tongue: None,  normal,Extremity Movements Upper (arms, wrists, hands, fingers): None, normal Lower (legs, knees, ankles, toes): None, normal, Trunk Movements Neck, shoulders, hips: None, normal, Overall Severity Severity of abnormal movements (highest score from questions above): None, normal Incapacitation due to abnormal movements: None, normal Patient's awareness of abnormal movements (rate only patient's report): No Awareness, Dental Status Current problems with teeth and/or dentures?: No Does patient usually wear dentures?: No  CIWA:  CIWA-Ar Total: 1 COWS:  COWS Total Score: 1  Assessment Patient remains depressed and anxious,and ruminative about multiple and significant psychosocial stressors. She is not suicidal , and has not engaged in self cutting, which she has a history of. She is tolerating medications well.  Treatment Plan Summary: Daily contact with patient to assess and evaluate symptoms and progress in treatment Medication management  Plan: Patient will continue to benefit from inpatient stay. Seroquel XR 100 mg po qhs.  Zoloft to 50 mg po daily.  Zyprexa Zydis 5 mg po qhs prn for agitation.     Medical Decision Making Problem Points:  Established problem, stable/improving (1) Data Points:  Review of medication regiment & side effects (2)  I certify that inpatient services furnished can reasonably be expected to improve the patient's condition.   Neita Garnet MD 08/21/2014, 4:59 PM

## 2014-08-21 NOTE — Plan of Care (Signed)
Problem: Alteration in thought process Goal: LTG-Patient verbalizes understanding importance med regimen (Patient verbalizes understanding of importance of medication regimen and need to continue outpatient care.)  Outcome: Progressing Patient has been taking her medications and states ,"I think they are working."

## 2014-08-21 NOTE — Progress Notes (Signed)
D:  Patient denied SI & Hi, contracts for safety.  Denied A/V hallucinations.  Patient took morning medications without difficulty, has been sitting in dayroom talking with peers.  Attended morning group.  Patient's self inventory sheet, patient sleeps good without any sleep medication.  Fair appetite, high energy level, good concentration.  Rated depression and hopeless #6.  Rated anxiety #8.  Denied withdrawals.  Denied I.  Denied physical problems.  Goal is to control her anxiety, learn coping skills for anxiety.  No discharge plans at this time, not sure where she will go after discharge. A:  Medications administered per MD orders.  Emotional support and encouragement given patient. R:  Safety maintained with 15 minute checks.

## 2014-08-21 NOTE — BHH Group Notes (Signed)
[place] LCSW Group Therapy  08/21/2014 1:15 pm  Type of Therapy: Process Group Therapy  Participation Level:  Active  Participation Quality:  Appropriate  Affect:  Flat  Cognitive:  Oriented  Insight:  Improving  Engagement in Group:  Limited  Engagement in Therapy:  Limited  Modes of Intervention:  Activity, Clarification, Education, Problem-solving and Support  Summary of Progress/Problems: Today's group addressed the issue of overcoming obstacles.  Patients were asked to identify their biggest obstacle post d/c that stands in the way of their on-going success, and then problem solve as to how to manage this.  After sharing that she is homeless and wonders where she will go at d/c, went on to say that she is more concerned about "everyone getting up in my business about my relationship with my boyfriend, including some of his home boys who might want to mess me up.'   Was given lots of advice by other group members about avoiding others.  She "yes, butted" everything that was suggested.  Roque Lias B 08/21/2014   4:11 PM

## 2014-08-21 NOTE — BHH Group Notes (Signed)
[facility] LCSW Aftercare Discharge Planning Group Note   08/21/2014 10:59 AM  Participation Quality:  Engaged  Mood/Affect:  Appropriate  Depression Rating:  5  Anxiety Rating:  5  Thoughts of Suicide:  No Will you contract for safety?   NA  Current AVH:  No  Plan for Discharge/Comments:  Stated she spoke with her baby sister over the weekend, and that made it a good weekend.  Furthermore, stated that she is having her mother check on whether a letter came to her old address in the last week from the [employer].  No firm plans for housing at d/c.    Transportation Means: unk  Supports: unk  [employer], Ken Caryl B

## 2014-08-22 NOTE — Clinical Social Work Note (Signed)
With patient's consent, made referral to [facility] ACT team.  They were full.  Will need to refer to another ACT team.

## 2014-08-22 NOTE — BHH Group Notes (Signed)
[place] LCSW Group Therapy  08/22/2014 , 1:34 PM   Type of Therapy:  Group Therapy  Participation Level:  Active  Participation Quality:  Attentive  Affect:  Appropriate  Cognitive:  Alert  Insight:  Improving  Engagement in Therapy:  Engaged  Modes of Intervention:  Discussion, Exploration and Socialization  Summary of Progress/Problems: Today's group focused on the term Diagnosis.  Participants were asked to define the term, and then pronounce whether it is a negative, positive or neutral term.  Sat quietly throughout.  Was drawing for much of the time.  Had nothing to contribute, even when asked directly.  Roque Lias B 08/22/2014 , 1:34 PM

## 2014-08-22 NOTE — Tx Team (Signed)
  Interdisciplinary Treatment Plan Update   Date Reviewed:  08/22/2014  Time Reviewed:  8:06 AM  Progress in Treatment:   Attending groups: Yes Participating in groups: Yes Taking medication as prescribed: Yes  Tolerating medication: Yes Family/Significant other contact made: Yes  Patient understands diagnosis: Yes  Discussing patient identified problems/goals with staff: Yes  See initial care plan Medical problems stabilized or resolved: Yes Denies suicidal/homicidal ideation: Yes  In tx team Patient has not harmed self or others: Yes  For review of initial/current patient goals, please see plan of care.  Estimated Length of Stay:  3-5 days  Reason for Continuation of Hospitalization: Depression Medication stabilization  New Problems/Goals identified:  N/A  Discharge Plan or Barriers:   see below  Additional Comments:  Housing is the issue.  Pt is homeless, and is realistic about going to a shelter, but at this point is stating she will not go out of [place] to escape the drama even though many are giving her advice to leave.  Have left a message for Judson Roch at [facility] through [place] to find out about placement there.  Will also ask pt about signing release for referral to ACT team.  Attendees:  Signature: Steva Colder, MD 08/22/2014 8:06 AM   Signature: Ripley Fraise, LCSW 08/22/2014 8:06 AM  Signature: Elmarie Shiley, NP 08/22/2014 8:06 AM  Signature: Mayra Neer, RN 08/22/2014 8:06 AM  Signature: Darrol Angel, RN 08/22/2014 8:06 AM  Signature:  08/22/2014 8:06 AM  Signature:   08/22/2014 8:06 AM  Signature:    Signature:    Signature:    Signature:    Signature:    Signature:      Scribe for Treatment Team:   Ripley Fraise, LCSW  08/22/2014 8:06 AM

## 2014-08-22 NOTE — Progress Notes (Signed)
D:  Patient's self inventory sheet, patient slept fair last night, no sleep medication needed.  Fair appetite, hyper energy, poor concentration.  Rated depression and hopeless #5, anxiety #6.  Denied withdrawals.  Denied SI.  Denied physical problems.  Denied physical pain.  No pain medication needed.  Goal is to work on her anger, find coping skills.  No discharge plan. A:  Medications administered per MD orders.  Emotional support and encouragement given patient. R:  Denied SI and HI.  Denied A/V hallucinations.  Denied pain.  Safety maintained with 15 minute checks. Patient stated she will drink and take dope after discharge.   Patient stated she wants to go to rehab so she will not be homeless.   Nurse encouraged patient to set goals for herself, school, work, talk to SW/MD about placement, etc.

## 2014-08-22 NOTE — Progress Notes (Signed)
Adult Psychoeducational Group Note  Date:  08/22/2014 Time:  3:03 AM  Group Topic/Focus:  Wrap-Up Group:   The focus of this group is to help patients review their daily goal of treatment and discuss progress on daily workbooks.  Participation Level:  Active  Participation Quality:  Appropriate  Affect:  Appropriate  Cognitive:  Oriented  Insight: Limited  Engagement in Group:  Engaged  Modes of Intervention:  Socialization and Support  Additional Comments:  Patient attended and participated in group tonight. She reports that today was not a good day for her, however, she did attended her groups and went for her meals. The patient advises that she is dealing with a situation that she needs to leave alone but it not able to do so at this time.  Deanna Valencia [facility] 08/22/2014, 3:03 AM

## 2014-08-22 NOTE — BHH Group Notes (Signed)
The focus of this group is to educate the patient on the purpose and policies of crisis stabilization and provide a format to answer questions about their admission.  The group details unit policies and expectations of patients while admitted.  Patient did not attend nurse education orientation group this morning.  Patient stayed in her room.  

## 2014-08-22 NOTE — Progress Notes (Signed)
Adult Psychoeducational Group Note  Date:  08/22/2014 Time:  10:39 PM  Group Topic/Focus:  Wrap-Up Group:   The focus of this group is to help patients review their daily goal of treatment and discuss progress on daily workbooks.  Participation Level:  Active  Participation Quality:  Appropriate  Affect:  Appropriate  Cognitive:  Oriented  Insight: Appropriate  Engagement in Group:  Engaged  Modes of Intervention:  Socialization and Support  Additional Comments:  Patient attended and participated in group tonight. She reports having a bitter/sweet day. It was not a bad day but not one of the better ones. Patient advised that she talked to family and friends on the phone. She went for her meals and groups.   Salley Scarlet [facility] 08/22/2014, 10:39 PM

## 2014-08-22 NOTE — Progress Notes (Signed)
Patient ID: Deanna Valencia, female   DOB: 01/18/1996, 18 y.o.   MRN: [id number] [facility] MD Progress Note  08/22/2014 10:03 AM Deanna Valencia  MRN:  [id number] Subjective:  Patient reports "today is a better day", "but I feel so sleepy this AM" Objective:  Patient reports a series of significant psychosocial stressors- boyfriend now in jail for recent crime. Patient thinks he did not do it. She is homeless and describes a poor support network. She had been enrolled in college at one time but currently not. She recently was incarcerated due to domestic violence allegations.  Patient has a hx of borderline personality disorder,has multiple cut marks on her right wrist. Patient does endorse anxiety ,but is  improved and has better mood control than when she came in. However she reports feeling sleepy/tired  in the AM . Patient denies any SI/HI/AH/VH at this time ,but does endorse AH ,whenever she is stressed out.  Not endorsing medication side effects at present.  Per staff patient reported anxiety as well as recent stressors and reported she will go out and medicate self with ETOH/Dope to deal with it.  Diagnosis:   DSM5: Primary Psychiatric Diagnosis:  Major depressive disorder,recurrent ,moderate   Secondary Psychiatric Diagnosis:  Cannabis use disorder ,moderate Borderline personality disorder  Hx of ADHD   Non Psychiatric Diagnosis:  None   Total Time spent with patient: 20 minutes   ADL's: fair   Sleep: Good  Appetite: improving    Psychiatric Specialty Exam: Physical Exam  ROS  Blood pressure 120/69, pulse 63, temperature 98.1 F (36.7 C), temperature source Oral, resp. rate 16, height 5\' 1"  (1.549 m), weight 78.472 kg (173 lb), last menstrual period 08/04/2014.Body mass index is 32.7 kg/(m^2).  General Appearance: Fairly Groomed  Engineer, water::  Good  Speech:  Normal Rate  Volume:  Normal  Mood:  Anxious and Depressed  Affect:  Labile  Thought Process:  Linear   Orientation:  Full (Time, Place, and Person)  Thought Content:  Rumination- ruminates about stressors as noted above. At this time denies hallucinations and does not appear internally preoccupied   Suicidal Thoughts:  No,patient currently denies any plan or intention of hurting self and contracts for safety on the unit. However patient reports if she goes outside she will not be safe.  Homicidal Thoughts:  No  Memory:  Recent and remote grossly intact   Judgement:  Impaired  Insight:  Fair and Lacking  Psychomotor Activity:  Normal  Concentration:  Good  Recall:  Good  Fund of Knowledge:Good  Language: Good  Akathisia:  No    AIMS (if indicated):     Assets:  Communication Skills Desire for Improvement  Sleep:  Number of Hours: 6.75   Musculoskeletal: Strength & Muscle Tone: within normal limits Gait & Station: normal Patient leans: N/A  Current Medications: Current Facility-Administered Medications  Medication Dose Route Frequency Provider Last Rate Last Dose  . acetaminophen (TYLENOL) tablet 650 mg  650 mg Oral Q6H PRN Ursula Alert, MD      . alum & mag hydroxide-simeth (MAALOX/MYLANTA) 200-200-20 MG/5ML suspension 30 mL  30 mL Oral Q4H PRN Ursula Alert, MD      . hydrOXYzine (ATARAX/VISTARIL) tablet 25 mg  25 mg Oral TID PRN Ursula Alert, MD   25 mg at 08/21/14 2301  . magnesium hydroxide (MILK OF MAGNESIA) suspension 30 mL  30 mL Oral Daily PRN Ursula Alert, MD      . OLANZapine zydis (ZYPREXA) disintegrating tablet  5 mg  5 mg Oral Q6H PRN Ursula Alert, MD   5 mg at 08/17/14 2200  . QUEtiapine (SEROQUEL XR) 24 hr tablet 100 mg  100 mg Oral QHS Ursula Alert, MD   100 mg at 08/21/14 2301  . sertraline (ZOLOFT) tablet 50 mg  50 mg Oral Daily Ursula Alert, MD   50 mg at 08/22/14 0754    Lab Results:  No results found for this or any previous visit (from the past 48 hour(s)).  Physical Findings: AIMS: Facial and Oral Movements Muscles of Facial Expression:  None, normal Lips and Perioral Area: None, normal Jaw: None, normal Tongue: None, normal,Extremity Movements Upper (arms, wrists, hands, fingers): None, normal Lower (legs, knees, ankles, toes): None, normal, Trunk Movements Neck, shoulders, hips: None, normal, Overall Severity Severity of abnormal movements (highest score from questions above): None, normal Incapacitation due to abnormal movements: None, normal Patient's awareness of abnormal movements (rate only patient's report): No Awareness, Dental Status Current problems with teeth and/or dentures?: No Does patient usually wear dentures?: No  CIWA:  CIWA-Ar Total: 1 COWS:  COWS Total Score: 1  Assessment Patient remains depressed and anxious,and ruminative about multiple and significant psychosocial stressors. She is not suicidal but reports being unsafe if discharged secondary to her many stressors and lack of social support as well as her inability to cope well 2/2 her borderline personality disorder.  Treatment Plan Summary: Daily contact with patient to assess and evaluate symptoms and progress in treatment Medication management  Plan: Patient will continue to benefit from inpatient stay. Seroquel XR 100 mg po qhs.  Zoloft 50 mg po daily.  Zyprexa Zydis 5 mg po qhs prn for agitation.  CSW will work on disposition. Patient to be referred for counseling as well as medication management.    Medical Decision Making Problem Points:  Established problem, stable/improving (1) Data Points:  Review of medication regiment & side effects (2)  I certify that inpatient services furnished can reasonably be expected to improve the patient's condition.   Joseandres Mazer MD 08/22/2014, 10:03 AM

## 2014-08-22 NOTE — Progress Notes (Signed)
D: Patient in the phone room on approach.  Patient states she had a good day.  Patient states her goal for today was to have less anger and get into less confrontations with peers.  Patient states she met her goal by not getting in to any arguments with anyone today.  Patient states she is anxious due to being homeless.  Patient states she is supposed to be going to transitional housing in [place] but feels hesitant.  Patient states she would rather go to rehab because she states when she is discharged she is going to drink alcohol and smoke marijuana.  Patient denies SI/HI and denies AVH. A: Staff to monitor Q 15 mins for safety.  Encouragement and support offered.  Scheduled medications administered per orders. R: Patient remains safe on the unit.  Patient attended group tonight.  Patient visible on the unit and interacting with peers.  Patient taking administered medications.

## 2014-08-23 NOTE — BHH Group Notes (Signed)
[place] LCSW Group Therapy  08/23/2014 1:17 PM  Type of Therapy:  Group Therapy  Participation Level:  None  Participation Quality:  Appropriate  Affect:  Anxious, Irritable   Cognitive:  Appropriate  Insight:  Limited  Engagement in Therapy:  Limited  Modes of Intervention:  Discussion, Education, Socialization and Support  Summary of Progress/Problems:[employer] ([place]) speaker came to talk about his personal journey with substance abuse and mental illness. Group members were challenged to process ways by which to relate to the speaker. [place] speaker provided handouts and educational information pertaining to groups and services offered by the [facility].    Hyatt,Candace 08/23/2014, 1:17 PM

## 2014-08-23 NOTE — Plan of Care (Signed)
Problem: Alteration in thought process Goal: LTG-Patient behavior demonstrates decreased signs psychosis Pt denies AVH at this time. No evidence of paranoid ideation or delusional thinking. Goal met. [place], LCSWA 08/18/2014 9:45 AM  Outcome: Progressing Patient denies A/V hallucinations and appears grounded in reality.

## 2014-08-23 NOTE — Progress Notes (Signed)
Patient ID: Deanna Valencia, female   DOB: June 25, 1996, 18 y.o.   MRN: [id number]   DAR: Pt. Denies SI/HI and A/V Hallucinations to this writer upon assessment at 0830 AM. Writer received patient's self inventory sheet at 1034 and it reported that patient had thoughts of self harm. Patient was approached by writer and patient appeared irritable. Patient stated she could contract for safety while in the hospital. Patient reports depression, anxiety, and hopelessness at 10/10 for the day. Patient reports that her concentration level, energy level and appetite is poor/fair. Patient reports that she selpt fair last night. Patient is minimal with Probation officer but takes medications as prescribed. Support and encouragement provided to the patient to speak with writer about questions or concerns. Q15 minute checks are maintained for safety.

## 2014-08-23 NOTE — BHH Group Notes (Signed)
[facility] LCSW Aftercare Discharge Planning Group Note   08/23/2014 10:00 AM  Participation Quality: Minimal   Mood/Affect:  Flat  Depression Rating:  5  Anxiety Rating:  7  Thoughts of Suicide:  No Will you contract for safety?   NA  Current AVH:  No  Plan for Discharge/Comments:  Pt plans to go to a shelter and receive outpatient services. Pt presents with depressed mood and flat affect. She reports that she is hoping to get into transitional care in [facility], although she prefers [place]. CSW assessing followup options and is working with [facility] in attempt to refer pt for services.   Transportation Means: Bus   Supports: Mom  Hyatt,Candace

## 2014-08-23 NOTE — Progress Notes (Signed)
Pt stated that her day was bittersweet because she may end up going to a homeless shelter, if nothing comes through for her tomorrow.

## 2014-08-23 NOTE — Progress Notes (Signed)
Patient ID: Deanna Valencia, female   DOB: Jan 29, 1996, 18 y.o.   MRN: [id number] [facility] MD Progress Note  08/23/2014 2:21 PM Deanna Valencia  MRN:  [id number] Subjective:  Patient reports "I am doing better" Objective:  Patient seen and chart reviewed.Patient reports she is improving and is better able to cope with her anxiety issues. Patient reports a series of significant psychosocial stressors- boyfriend now in jail for recent crime. Patient thinks he did not do it. She is homeless and describes a poor support network. She had been enrolled in college at one time but currently not. She recently was incarcerated due to domestic violence allegations.  Patient has a hx of borderline personality disorder,has multiple cut marks on her right wrist. Patient did not have any cutting after coming here. Patient denies any SI/HI/AH/VH at this time . Not endorsing medication side effects at present. Per staff patient is willing to be discharged to a shelter/transition home , hence CSW will work on disposition.  Diagnosis:   DSM5: Primary Psychiatric Diagnosis:  Major depressive disorder,recurrent ,moderate   Secondary Psychiatric Diagnosis:  Cannabis use disorder ,moderate Borderline personality disorder  Hx of ADHD   Non Psychiatric Diagnosis:  None   Total Time spent with patient: 20 minutes   ADL's: fair   Sleep: Good  Appetite: improving    Psychiatric Specialty Exam: Physical Exam  ROS  Blood pressure 142/76, pulse 102, temperature 98 F (36.7 C), temperature source Oral, resp. rate 17, height 5\' 1"  (1.549 m), weight 78.472 kg (173 lb), last menstrual period 08/04/2014.Body mass index is 32.7 kg/(m^2).  General Appearance: Fairly Groomed  Engineer, water::  Good  Speech:  Normal Rate  Volume:  Normal  Mood:  Anxious and Depressed improving  Affect:  Appropriate  Thought Process:  Linear  Orientation:  Full (Time, Place, and Person)  Thought Content:  Rumination- ruminates  about stressors as noted above. At this time denies hallucinations and does not appear internally preoccupied   Suicidal Thoughts:  No,patient currently denies any plan or intention of hurting self and contracts for safety on the unit. However patient reports if she goes outside she will not be safe.  Homicidal Thoughts:  No  Memory:  Recent and remote grossly intact   Judgement:  Impaired  Insight:  Fair  Psychomotor Activity:  Normal  Concentration:  Good  Recall:  Good  Fund of Knowledge:Good  Language: Good  Akathisia:  No    AIMS (if indicated):     Assets:  Communication Skills Desire for Improvement  Sleep:  Number of Hours: 5.25   Musculoskeletal: Strength & Muscle Tone: within normal limits Gait & Station: normal Patient leans: N/A  Current Medications: Current Facility-Administered Medications  Medication Dose Route Frequency Provider Last Rate Last Dose  . acetaminophen (TYLENOL) tablet 650 mg  650 mg Oral Q6H PRN Ursula Alert, MD      . alum & mag hydroxide-simeth (MAALOX/MYLANTA) 200-200-20 MG/5ML suspension 30 mL  30 mL Oral Q4H PRN Ursula Alert, MD      . hydrOXYzine (ATARAX/VISTARIL) tablet 25 mg  25 mg Oral TID PRN Ursula Alert, MD   25 mg at 08/22/14 2258  . magnesium hydroxide (MILK OF MAGNESIA) suspension 30 mL  30 mL Oral Daily PRN Ursula Alert, MD      . OLANZapine zydis (ZYPREXA) disintegrating tablet 5 mg  5 mg Oral Q6H PRN Ursula Alert, MD   5 mg at 08/17/14 2200  . QUEtiapine (SEROQUEL XR) 24 hr  tablet 100 mg  100 mg Oral QHS Ursula Alert, MD   100 mg at 08/22/14 2258  . sertraline (ZOLOFT) tablet 50 mg  50 mg Oral Daily Malin Cervini, MD   50 mg at 08/23/14 0830    Lab Results:  No results found for this or any previous visit (from the past 48 hour(s)).  Physical Findings: AIMS: Facial and Oral Movements Muscles of Facial Expression: None, normal Lips and Perioral Area: None, normal Jaw: None, normal Tongue: None, normal,Extremity  Movements Upper (arms, wrists, hands, fingers): None, normal Lower (legs, knees, ankles, toes): None, normal, Trunk Movements Neck, shoulders, hips: None, normal, Overall Severity Severity of abnormal movements (highest score from questions above): None, normal Incapacitation due to abnormal movements: None, normal Patient's awareness of abnormal movements (rate only patient's report): No Awareness, Dental Status Current problems with teeth and/or dentures?: No Does patient usually wear dentures?: No  CIWA:  CIWA-Ar Total: 1 COWS:  COWS Total Score: 1  Assessment Patient remains depressed and anxious,and ruminative about multiple and significant psychosocial stressors. Patient also has a hx of Borderline personality disorder ,however currently has not endorsed any self mutilating behavior.  Treatment Plan Summary: Daily contact with patient to assess and evaluate symptoms and progress in treatment Medication management  Plan: Patient will continue to benefit from inpatient stay. Seroquel XR 100 mg po qhs.  Zoloft 50 mg po daily.  Zyprexa Zydis 5 mg po qhs prn for agitation.  CSW will work on disposition. Patient to be referred to a shelter /transition home as well as  counseling ,medication management.    Medical Decision Making Problem Points:  Established problem, stable/improving (1) Data Points:  Review of medication regiment & side effects (2)  I certify that inpatient services furnished can reasonably be expected to improve the patient's condition.   Adelena Desantiago MD 08/23/2014, 2:21 PM

## 2014-08-24 MED ORDER — QUETIAPINE FUMARATE ER 200 MG PO TB24
200.0000 mg | ORAL_TABLET | Freq: Every day | ORAL | Status: DC
  Administered 2014-08-24: 200 mg via ORAL
  Filled 2014-08-24: qty 4
  Filled 2014-08-24 (×2): qty 1

## 2014-08-24 MED ORDER — SERTRALINE HCL 100 MG PO TABS
100.0000 mg | ORAL_TABLET | Freq: Every day | ORAL | Status: DC
  Administered 2014-08-25: 100 mg via ORAL
  Filled 2014-08-24: qty 1
  Filled 2014-08-24: qty 4
  Filled 2014-08-24: qty 1

## 2014-08-24 NOTE — BHH Group Notes (Signed)
[place] Group Notes:  (Counselor/Nursing/MHT/Case Management/Adjunct)  08/24/2014 1:15PM  Type of Therapy:  Group Therapy  Participation Level:  Active  Participation Quality:  Appropriate  Affect:  Flat  Cognitive:  Oriented  Insight:  Improving  Engagement in Group:  Limited  Engagement in Therapy:  Limited  Modes of Intervention:  Discussion, Exploration and Socialization  Summary of Progress/Problems: The topic for group was balance in life.  Pt participated in the discussion about when their life was in balance and out of balance and how this feels.  Pt discussed ways to get back in balance and short term goals they can work on to get where they want to be.   "I want to have my own place, I want to be in school, and I want to have a job.  Oh, and I want to not have any mental health issues."  Stated that she understands the first step is to get stable housing, and talked about knowing that even though she will have to go to a shelter from here, she knows that with the help of advice from others, including her ACT team, that she will get into a stable housing situation.   Roque Lias B 08/24/2014 2:01 PM

## 2014-08-24 NOTE — Progress Notes (Signed)
Pt alert, oriented and cooperative. Denies SI/HI, -A/V hall. Verbally contracts for safety. Affect/mood anxious and depressed but brightens on approach.  Interactive with peers.  Reports feeling better."some days I'm up, some days I'm down" , "I'm praying to get into a transitional home, I'm not ready to leave, If I leave I will be right back with the same thing". Emotional support and encouragement given. Pt denies pain or discomfort. Will continue to monitor closely and evaluate for stabilization.

## 2014-08-24 NOTE — Progress Notes (Signed)
Patient is visible on the unit. Is loud and boisterous at times. Affect appropriate to situation with neutral mood. Patient states she is very anxious and depressed however. Denies pain, physical problems. Support given. Medicated per orders. Patient states she wants to go home however does not want to go to a shelter. Is hoping a friend of a fellow patient can assist her upon discharge. MD and CM made aware. She denies SI/HI/AVH and remains safe. Jamie Kato

## 2014-08-24 NOTE — BHH Group Notes (Signed)
[place] Group Notes:  (Nursing/MHT/Case Management/Adjunct)  Date:  08/24/2014  Time:  10:30am  Type of Therapy:  Nurse Education  Participation Level:  Did Not Attend  Participation Quality:    Affect:    Cognitive:    Insight:    Engagement in Group:    Modes of Intervention:    Summary of Progress/Problems:  Jamie Kato 08/24/2014, 1:05 PM

## 2014-08-24 NOTE — Progress Notes (Addendum)
Patient ID: Deanna Valencia, female   DOB: 1996/05/21, 18 y.o.   MRN: [id number] [facility] MD Progress Note  08/24/2014 12:05 PM DIOSELINA BRUMBAUGH  MRN:  [id number] Subjective:  Patient reports "I am not ready ,I will end up right back in if you send me home " Objective:  Patient seen and chart reviewed.Patient reports her anxiety as high and depression at a peak today. Patient reports several stressors and feels like she is not ready to cope with it yet. Patient was seen by ACTT yesterday .  Patient is homeless and is being referred for placement. Patient has a hx of borderline personality disorder,has multiple cut marks on her right wrist. Patient did not have any cutting after coming here. Patient denies any SI/HI/AH/VH at this time however reports she may not be safe if discharged today. Not endorsing medication side effects at present. Per staff patient is willing to be discharged to a shelter/transition home , hence CSW will work on disposition.  Diagnosis:   DSM5: Primary Psychiatric Diagnosis:  Major depressive disorder,recurrent ,moderate   Secondary Psychiatric Diagnosis:  Cannabis use disorder ,moderate Borderline personality disorder  Hx of ADHD   Non Psychiatric Diagnosis:  None   Total Time spent with patient: 20 minutes   ADL's: fair   Sleep: Fair  Appetite: improving    Psychiatric Specialty Exam: Physical Exam  ROS  Blood pressure 136/69, pulse 116, temperature 97.8 F (36.6 C), temperature source Oral, resp. rate 17, height 5\' 1"  (1.549 m), weight 78.472 kg (173 lb), last menstrual period 08/04/2014.Body mass index is 32.7 kg/(m^2).  General Appearance: Fairly Groomed  Engineer, water::  Good  Speech:  Normal Rate  Volume:  Normal  Mood:  Anxious and Depressed  Affect:  Appropriate  Thought Process:  Linear  Orientation:  Full (Time, Place, and Person)  Thought Content:  Rumination- ruminates about stressors as noted above. At this time denies  hallucinations and does not appear internally preoccupied   Suicidal Thoughts:  No,patient currently denies any plan or intention of hurting self and contracts for safety on the unit. However patient reports if she goes outside she will not be safe.  Homicidal Thoughts:  No  Memory:  Recent and remote grossly intact   Judgement:  Impaired  Insight:  Fair  Psychomotor Activity:  Normal  Concentration:  Good  Recall:  Good  Fund of Knowledge:Good  Language: Good  Akathisia:  No    AIMS (if indicated):     Assets:  Communication Skills Desire for Improvement  Sleep:  Number of Hours: 5.75   Musculoskeletal: Strength & Muscle Tone: within normal limits Gait & Station: normal Patient leans: N/A  Current Medications: Current Facility-Administered Medications  Medication Dose Route Frequency Provider Last Rate Last Dose  . acetaminophen (TYLENOL) tablet 650 mg  650 mg Oral Q6H PRN Ursula Alert, MD      . alum & mag hydroxide-simeth (MAALOX/MYLANTA) 200-200-20 MG/5ML suspension 30 mL  30 mL Oral Q4H PRN Ursula Alert, MD      . hydrOXYzine (ATARAX/VISTARIL) tablet 25 mg  25 mg Oral TID PRN Ursula Alert, MD   25 mg at 08/23/14 2115  . magnesium hydroxide (MILK OF MAGNESIA) suspension 30 mL  30 mL Oral Daily PRN Ursula Alert, MD      . OLANZapine zydis (ZYPREXA) disintegrating tablet 5 mg  5 mg Oral Q6H PRN Ursula Alert, MD   5 mg at 08/17/14 2200  . QUEtiapine (SEROQUEL XR) 24 hr tablet 200  mg  200 mg Oral QHS Ursula Alert, MD      . Derrill Memo ON 08/25/2014] sertraline (ZOLOFT) tablet 100 mg  100 mg Oral Daily Ursula Alert, MD        Lab Results:  No results found for this or any previous visit (from the past 48 hour(s)).  Physical Findings: AIMS: Facial and Oral Movements Muscles of Facial Expression: None, normal Lips and Perioral Area: None, normal Jaw: None, normal Tongue: None, normal,Extremity Movements Upper (arms, wrists, hands, fingers): None, normal Lower  (legs, knees, ankles, toes): None, normal, Trunk Movements Neck, shoulders, hips: None, normal, Overall Severity Severity of abnormal movements (highest score from questions above): None, normal Incapacitation due to abnormal movements: None, normal Patient's awareness of abnormal movements (rate only patient's report): No Awareness, Dental Status Current problems with teeth and/or dentures?: No Does patient usually wear dentures?: No  CIWA:  CIWA-Ar Total: 1 COWS:  COWS Total Score: 1  Assessment Patient remains depressed and anxious,and ruminative about multiple and significant psychosocial stressors. Patient also has a hx of Borderline personality disorder ,however currently has not endorsed any self mutilating behavior.  Treatment Plan Summary: Daily contact with patient to assess and evaluate symptoms and progress in treatment Medication management  Plan: Patient will continue to benefit from inpatient stay. Will increase Seroquel XR to 200 mg po qhs.  Will increase Zoloft to 100 mg po daily.  Zyprexa Zydis 5 mg po qhs prn for agitation.  CSW will work on disposition. Patient to be referred to a shelter /transition home as well as  counseling ,medication management.    Medical Decision Making Problem Points:  Established problem, stable/improving (1) Data Points:  Review of medication regiment & side effects (2)  I certify that inpatient services furnished can reasonably be expected to improve the patient's condition.   Evelyna Folker MD 08/24/2014, 12:05 PM

## 2014-08-24 NOTE — Clinical Social Work Note (Signed)
CSW left message for Armandina Gemma ([facility]) on her cell: [phone] to discuss visit with pt yesterday. CSW requested call back at earliest convenience.  [place], [place] 08/24/2014 10:03 AM

## 2014-08-24 NOTE — BHH Group Notes (Signed)
Adult Psychoeducational Group Note  Date:  08/24/2014 Time:  9:10 PM  Group Topic/Focus:  Wrap-Up Group:   The focus of this group is to help patients review their daily goal of treatment and discuss progress on daily workbooks.  Participation Level:  Active  Participation Quality:  Appropriate  Affect:  Appropriate  Cognitive:  Appropriate  Insight: Appropriate  Engagement in Group:  Engaged  Modes of Intervention:  Discussion  Additional Comments:  Pt stated that she had "kinda of a good day". Pt stated that she is being discharged tomorrow but is apprehensive because she really doesn't really know where she is going to go. MHT told her to express her concerns to her Education officer, museum.  Harvin Hazel A 08/24/2014, 9:10 PM

## 2014-08-25 DIAGNOSIS — F411 Generalized anxiety disorder: Secondary | ICD-10-CM

## 2014-08-25 DIAGNOSIS — F332 Major depressive disorder, recurrent severe without psychotic features: Secondary | ICD-10-CM

## 2014-08-25 MED ORDER — QUETIAPINE FUMARATE ER 200 MG PO TB24: 200.0000 mg | ORAL_TABLET | Freq: Every day | ORAL | Status: DC

## 2014-08-25 MED ORDER — SERTRALINE HCL 100 MG PO TABS: 100.0000 mg | ORAL_TABLET | Freq: Every day | ORAL | Status: DC

## 2014-08-25 MED ORDER — HYDROXYZINE HCL 25 MG PO TABS: 25.0000 mg | ORAL_TABLET | Freq: Three times a day (TID) | ORAL | Status: DC | PRN

## 2014-08-25 NOTE — BHH Group Notes (Signed)
[facility] LCSW Aftercare Discharge Planning Group Note   08/25/2014 9:25 AM  Participation Quality:  Active  Mood/Affect:  Appropriate  Depression Rating:    Anxiety Rating:    Thoughts of Suicide:  No Will you contract for safety?   NA  Current AVH:  No  Plan for Discharge/Comments:  Pt will likely discharge today. She will follow up with her ACTT and the [employer]. Pt states that she is going to the [facility] after d/c and then is meeting with a friend's neighbor in order to work out living agreement with him (elderly man that needs help around his home in exchange for room and board." Pt provided with bus passes per her request and with shelter info "just in case."   Transportation Means: Bus   Supports: None  Hyatt,Candace

## 2014-08-25 NOTE — Plan of Care (Signed)
Problem: Ineffective individual coping Goal: LTG: Patient will report a decrease in negative feelings Outcome: Completed/Met Date Met:  08/25/14 Patient's mood is pleasant, denies AVH/SI/HI.  Problem: Alteration in thought process Goal: STG-Patient does not respond to command hallucinations Outcome: Completed/Met Date Met:  08/25/14 Patient denies AH.

## 2014-08-25 NOTE — Progress Notes (Signed)
[facility] Adult Case Management Discharge Plan :  Will you be returning to the same living situation after discharge: No.Plans to stay with friend's neighbor who will allow her to stay at their home in exchange for assistance around the house (elderly). At discharge, do you have transportation home?:Yes,  bus pass(es) in chart Do you have the ability to pay for your medications:Yes,  Medicaid/BCBS  Release of information consent forms completed and submitted to Medical Records by CSW.   Patient to Follow up at: Follow-up Information   Follow up with [facility] On 08/29/2014. (Appt. with Armandina Gemma on this date at 1:00PM. Her work cell is: [phone])    Contact information:   [address] [place], [place] [address] Phone: [phone] Fax: [phone]      Patient denies SI/HI:   Yes,  during group/self report.     Safety Planning and Suicide Prevention discussed:  Yes,  SPE completed with pt's mother. [facility] pamphlet provided to pt and she was encouraged to share information with support network, ask questions, and talk about any concerns relating to SPE.  [place], Norell Brisbin LCSWA  08/25/2014, 10:06 AM

## 2014-08-25 NOTE — Discharge Summary (Signed)
Physician Discharge Summary Note  Patient:  Deanna Valencia is an 18 y.o., female MRN:  [id number] DOB:  1995/11/29 Patient phone:  [phone] (home)  Patient address:   [address] [place] [address],  Total Time spent with patient: 45 minutes  Date of Admission:  08/17/2014 Date of Discharge: 08/25/2014  Reason for Admission:  Depression  Discharge Diagnoses: Active Problems:   Depression  Psychiatric Specialty Exam: Physical Exam  Psychiatric: She has a normal mood and affect. Her speech is normal and behavior is normal. Judgment and thought content normal. Cognition and memory are normal.    Review of Systems  Constitutional: Negative.   HENT: Negative.   Eyes: Negative.   Respiratory: Negative.   Cardiovascular: Negative.   Gastrointestinal: Negative.   Genitourinary: Negative.   Musculoskeletal: Negative.   Skin: Negative.   Neurological: Negative.   Endo/Heme/Allergies: Negative.   Psychiatric/Behavioral: Positive for depression (Hx of, chronic, stabilized). Negative for suicidal ideas, hallucinations, memory loss and substance abuse. The patient is nervous/anxious (Hx of, chronic, stabilized). The patient does not have insomnia.     Blood pressure 128/72, pulse 82, temperature 98.2 F (36.8 C), temperature source Oral, resp. rate 20, height 5\' 1"  (1.549 m), weight 78.472 kg (173 lb), last menstrual period 08/04/2014.Body mass index is 32.7 kg/(m^2).   Past Psychiatric History:  Diagnosis:MDD,BPD,ADHD   Hospitalizations:Atleast 10 at [facility]   Outpatient Care:[facility]   Substance Abuse Care:denies   Self-Mutilation:yes,cuts self and burns self   Suicidal Attempts:several, OD on pills   Violent Behaviors:yes has several charges ,assault    Musculoskeletal:  Strength & Muscle Tone: within normal limits  Gait & Station: normal  Patient leans: N/A   DSM5: Discharge Diagnoses: Primary Psychiatric Diagnosis:  Major depressive disorder,recurrent ,moderate(  improving)   Secondary Psychiatric Diagnosis:  Cannabis use disorder ,moderate  Borderline personality disorder  Hx of ADHD   Non Psychiatric Diagnosis:  None    Past Medical History  Diagnosis Date  . Depression   . Allergy   . Eating disorder   . UTI (urinary tract infection) 10/03/2013  . Foster care (status) 10/18/2013   Level of Care:  OP  Hospital Course:   AISA SCHOEPPNER is a 18 y.o. AA female who was voluntarily brought in by [facility] after bystanders saw pt standing on a bridge on [address], attempting to jump. Patient has a past history of major depressive disorder, borderline personality disorder, cutting as well as a history of ADHD as a child. Patient was recently admitted to [facility] on 03/30/14 for depression and was discharged on prozac and Abilify. Patient follows up with [facility] as an outpatient.    After evaluating Sai further, it was determined that medication intervention and management would be indicated to re-stabilize her moods.  Daily contact with patient to assess and evaluate symptoms and progress in treatment of group milieu therapy and medication management was done.  Doses to Seroquel and Zoloft had to be adjusted to achieve therapeutic results.    Patient was receptive to treatment.  She did not endorse any SI/HI/AVH and was contracting safety during stay.  CSW facilitated a referral to a shelter and a transition to home as well as counseling and medication management.  Patient also was referred for [facility] services prior to discharge. She verbalized understanding of discharge instructions and acknowledged the necessary steps to maximize ability to maintain mental health wellness.  Consults:  psychiatry  Significant Diagnostic Studies:  labs: Per ED  Discharge Vitals:  Blood pressure 128/72, pulse 82, temperature 98.2 F (36.8 C), temperature source Oral, resp. rate 20, height 5\' 1"  (1.549 m), weight 78.472 kg (173 lb), last menstrual period  08/04/2014. Body mass index is 32.7 kg/(m^2). Lab Results:   No results found for this or any previous visit (from the past 72 hour(s)).  Physical Findings: AIMS: Facial and Oral Movements Muscles of Facial Expression: None, normal Lips and Perioral Area: None, normal Jaw: None, normal Tongue: None, normal,Extremity Movements Upper (arms, wrists, hands, fingers): None, normal Lower (legs, knees, ankles, toes): None, normal, Trunk Movements Neck, shoulders, hips: None, normal, Overall Severity Severity of abnormal movements (highest score from questions above): None, normal Incapacitation due to abnormal movements: None, normal Patient's awareness of abnormal movements (rate only patient's report): No Awareness, Dental Status Current problems with teeth and/or dentures?: No Does patient usually wear dentures?: No  CIWA:  CIWA-Ar Total: 1 COWS:  COWS Total Score: 1  Psychiatric Specialty Exam: See Psychiatric Specialty Exam and Suicide Risk Assessment completed by Attending Physician prior to discharge.  Discharge destination:  Home  Is patient on multiple antipsychotic therapies at discharge:  No   Has Patient had three or more failed trials of antipsychotic monotherapy by history:  No  Recommended Plan for Multiple Antipsychotic Therapies: NA     Medication List       Indication   hydrOXYzine 25 MG tablet  Commonly known as:  ATARAX/VISTARIL  Take 1 tablet (25 mg total) by mouth 3 (three) times daily as needed for anxiety.   Indication:  Anxiety Neurosis     QUEtiapine 200 MG 24 hr tablet  Commonly known as:  SEROQUEL XR  Take 1 tablet (200 mg total) by mouth at bedtime. For mood stabilization   Indication:  Manic-Depression, Major Depressive Disorder, Mood stabilization     sertraline 100 MG tablet  Commonly known as:  ZOLOFT  Take 1 tablet (100 mg total) by mouth daily. For depression   Indication:  Major Depressive Disorder       Follow-up Information    Follow up with [facility] On 08/29/2014. (Appt. with Armandina Gemma on this date at 1:00PM. Her work cell is: [phone])    Sport and exercise psychologist information:   [address] [place], [place] [address] Phone: [phone] Fax: [phone]      Follow-up recommendations:  Activity:  As tolerated. Diet:  As tolerated  Comments:  1.  Take all your medications as prescribed.              2.  Report any adverse side effects to outpatient provider.                       3.  Patient instructed to not use alcohol or illegal drugs while on prescription medicines.            4.  In the event of worsening symptoms, instructed patient to call 911, the crisis hotline or go to nearest emergency room for evaluation of symptoms.  Total Discharge Time:  Greater than 30 minutes.  SignedKerrie Buffalo MAY, AGNP-BC 08/25/2014, 3:09 PM  Patient was seen face to face for psychiatric evaluation, suicide risk assessment and case discussed with treatment team and NP and made appropriate disposition plans. Reviewed the information documented and agree with the treatment plan.   Ursula Alert ,MD Attending [facility]

## 2014-08-25 NOTE — [facility] Suicide Risk Assessment (Signed)
Demographic Factors:  Adolescent or young adult and Low socioeconomic status  Total Time spent with patient: 45 minutes  Psychiatric Specialty Exam: Physical Exam  Constitutional: She is oriented to person, place, and time. She appears well-developed and well-nourished.  HENT:  Head: Normocephalic and atraumatic.  Neck: Normal range of motion. Neck supple.  Respiratory: Effort normal.  GI: Soft. Bowel sounds are normal.  Musculoskeletal: Normal range of motion.  Neurological: She is alert and oriented to person, place, and time.  Skin: Skin is warm.  Psychiatric: She has a normal mood and affect. Her speech is normal and behavior is normal. Judgment and thought content normal. Cognition and memory are normal.    Review of Systems  Constitutional: Negative.   HENT: Negative.   Eyes: Negative.   Respiratory: Negative.   Cardiovascular: Negative.   Gastrointestinal: Negative.   Genitourinary: Negative.   Musculoskeletal: Negative.   Skin: Negative.   Neurological: Negative.   Psychiatric/Behavioral: Positive for substance abuse. Negative for suicidal ideas and hallucinations. The patient is not nervous/anxious.     Blood pressure 128/72, pulse 82, temperature 98.2 F (36.8 C), temperature source Oral, resp. rate 20, height 5\' 1"  (1.549 m), weight 78.472 kg (173 lb), last menstrual period 08/04/2014.Body mass index is 32.7 kg/(m^2).  General Appearance: Casual  Eye Contact::  Fair  Speech:  Clear and Coherent  Volume:  Normal  Mood:  Euthymic  Affect:  Congruent  Thought Process:  Coherent  Orientation:  Full (Time, Place, and Person)  Thought Content:  WDL  Suicidal Thoughts:  No  Homicidal Thoughts:  No  Memory:  Immediate;   Good Recent;   Good Remote;   Good  Judgement:  Fair  Insight:  Fair  Psychomotor Activity:  Normal  Concentration:  Good  Recall:  Henriette of Knowledge:Good  Language: Good  Akathisia:  No  Handed:  Right  AIMS (if indicated):      Assets:  Communication Skills Desire for Improvement Intimacy  Sleep:  Number of Hours: 6    Musculoskeletal: Strength & Muscle Tone: within normal limits Gait & Station: normal Patient leans: N/A   Mental Status Per Nursing Assessment::   On Admission:     Current Mental Status by Physician: denies SI/HI/AH/VH  Loss Factors: Legal issues and Financial problems/change in socioeconomic status  Historical Factors: Prior suicide attempts, Family history of mental illness or substance abuse and Impulsivity  Risk Reduction Factors:   Positive therapeutic relationship  Continued Clinical Symptoms:  Alcohol/Substance Abuse/Dependencies More than one psychiatric diagnosis Previous Psychiatric Diagnoses and Treatments  Cognitive Features That Contribute To Risk:  Polarized thinking    Suicide Risk:  Acute risk is Minimal: No identifiable suicidal ideation.  Patients presenting with no risk factors but with morbid ruminations; may be classified as minimal risk based on the severity of the depressive symptoms However chronic risk is moderate to high since patient has psychiatric illness that coexist with her personality disorder,hx of substance abuse,poor social support,legal problems,recent issues with boyfriend.  Discharge Diagnoses:DSM  5  Primary Psychiatric Diagnosis:  Major depressive disorder,recurrent ,moderate9 improving)   Secondary Psychiatric Diagnosis:  Cannabis use disorder ,moderate  Borderline personality disorder  Hx of ADHD   Non Psychiatric Diagnosis:  None    Past Medical History  Diagnosis Date  . Depression   . Allergy   . Eating disorder   . UTI (urinary tract infection) 10/03/2013  . Foster care (status) 10/18/2013    Plan Of Care/Follow-up recommendations:  Activity:  no restriction  Is patient on multiple antipsychotic therapies at discharge:  No   Has Patient had three or more failed trials of antipsychotic monotherapy by history:   No  Recommended Plan for Multiple Antipsychotic Therapies: NA    Aryanah Enslow MD 08/25/2014, 9:30 AM

## 2014-08-25 NOTE — Progress Notes (Signed)
Patient packed and ready for discharge. Is denying all psychiatric symptoms. No  SI/HI/AVH. Discharge teaching explained, sample meds given and Rx's provided. All belongings returned to patient along with provided bus passes and directions to bus stop. Information given on [facility] and [employer]. Patient verbalized instructions and was discharged in stable condition. Jamie Kato

## 2014-08-25 NOTE — Tx Team (Signed)
  Interdisciplinary Treatment Plan Update   Date Reviewed:  08/25/2014  Time Reviewed:  10:07 AM  Progress in Treatment:   Attending groups: Yes Participating in groups: Yes Taking medication as prescribed: Yes  Tolerating medication: Yes Family/Significant other contact made: Yes, with pt's mother.  Patient understands diagnosis: Yes  Discussing patient identified problems/goals with staff: Yes  See initial care plan Medical problems stabilized or resolved: Yes Denies suicidal/homicidal ideation: Yes  In tx team Patient has not harmed self or others: Yes  For review of initial/current patient goals, please see plan of care.  Estimated Length of Stay:  d/c today   Reason for Continuation of Hospitalization: none   New Problems/Goals identified:  N/A  Discharge Plan or Barriers:   Pt plans to stay with a friend's elderly neighbor, who will allow her to live at his home in exchange for completing household chores. Pt feels that this is a good option for her. She will be given shelter info and [facility] information/bus passes prior to d/c by CSW. Pt has appt with Anderson Malta from [facility] on Tuesday to begin services and continue working on permanent housing.   Additional Comments:  N/a   Attendees:  Signature: Steva Colder, MD 08/25/2014 10:07 AM   Signature: Ripley Fraise, LCSW 08/25/2014 10:07 AM  Signature: Maxie Better, LCSWA  08/25/2014 10:07 AM  Signature: Lahoma Rocker, RN  08/25/2014 10:07 AM  Signature: Bonnye Fava, CSW Intern 08/25/2014 10:07 AM  Signature: Gerline Legacy Nurse CM.  08/25/2014 10:07 AM  Signature:   08/25/2014 10:07 AM  Signature:    Signature:    Signature:    Signature:    Signature:    Signature:      Scribe for Treatment Team:   Maxie Better, [place]  08/25/2014 10:07 AM  Pt and CSW reviewed pt's identified goals and treatment plan. Pt verbalized understanding and agreed to treatment plan.

## 2014-08-25 NOTE — Progress Notes (Signed)
Pt is pleasant and appropriate this morning however can be labile at times. Occasionally set off by peers. Denying pain or physical problems. Reports adequate appetite and sleep. Medicated with morning zoloft. Support given. She denies SI/HI/AVH. Discharge plan in progress. Jamie Kato

## 2014-08-25 NOTE — Progress Notes (Signed)
[place] [facility] Group Notes:  (Nursing/MHT/Case Management/Adjunct)  Date:  08/25/2014  Time:  1:28 PM  Type of Therapy:  Therapeutic Activity  Participation Level:  Active  Participation Quality:  Appropriate  Affect:  Appropriate  Cognitive:  Appropriate  Insight:  Appropriate  Engagement in Group:  Engaged  Modes of Intervention:  Activity  Summary of Progress/Problems: Pts played an activity of Pictionary using coping skills. Pt was engaged and actively participated.  Clint Bolder 08/25/2014, 1:28 PM

## 2014-08-25 NOTE — Progress Notes (Signed)
D: Pt presents flat in affect and depressed in mood. Pt is concerned about not knowing her exact place of residence for discharge. Pt observed on the phone for the majority of the evening. Pt forwards a little in interaction with this Probation officer. Pt is currently denying any SI/HI/AVH. Pt endorses anxiety this evening.  A: Writer administered scheduled and prn medications to pt. Continued support and availability as needed was extended to this pt. Staff continue to monitor pt with q46min checks.  R: No adverse drug reactions noted. Pt receptive to treatment. Pt remains safe at this time.

## 2014-08-29 NOTE — Progress Notes (Signed)
Patient Discharge Instructions:  After Visit Summary (AVS):   Faxed to:  08/29/14 Discharge Summary Note:   Faxed to:  08/29/14 Psychiatric Admission Assessment Note:   Faxed to:  08/29/14 Suicide Risk Assessment - Discharge Assessment:   Faxed to:  08/29/14 Faxed/Sent to the Next [facility] Care provider:  08/29/14 Faxed to [facility] [place], 08/29/2014, 2:43 PM

## 2014-09-13 ENCOUNTER — Telehealth: Payer: Self-pay | Admitting: *Deleted

## 2014-09-13 NOTE — Telephone Encounter (Deleted)
Created in error

## 2014-09-13 NOTE — Telephone Encounter (Signed)
Spoke to patient, she doesn't feel she needs an OV, encouraged patient to contact us with any concerns. Patient expressed understanding.

## 2014-09-13 NOTE — Telephone Encounter (Signed)
Created in error

## 2014-10-12 ENCOUNTER — Encounter (HOSPITAL_COMMUNITY): Payer: Self-pay | Admitting: *Deleted

## 2014-10-12 ENCOUNTER — Emergency Department (HOSPITAL_COMMUNITY)
Admission: EM | Admit: 2014-10-12 | Discharge: 2014-10-12 | Disposition: A | Discharge status: Home / Self Care | Payer: Federal, State, Local not specified - PPO | Attending: Emergency Medicine | Admitting: Emergency Medicine

## 2014-10-12 DIAGNOSIS — Z720 Tobacco use: Secondary | ICD-10-CM | POA: Diagnosis not present

## 2014-10-12 DIAGNOSIS — R110 Nausea: Secondary | ICD-10-CM | POA: Insufficient documentation

## 2014-10-12 DIAGNOSIS — Z3202 Encounter for pregnancy test, result negative: Secondary | ICD-10-CM | POA: Insufficient documentation

## 2014-10-12 DIAGNOSIS — Z79899 Other long term (current) drug therapy: Secondary | ICD-10-CM | POA: Diagnosis not present

## 2014-10-12 DIAGNOSIS — F329 Major depressive disorder, single episode, unspecified: Secondary | ICD-10-CM | POA: Insufficient documentation

## 2014-10-12 DIAGNOSIS — R1013 Epigastric pain: Secondary | ICD-10-CM | POA: Diagnosis present

## 2014-10-12 DIAGNOSIS — Z880 Allergy status to penicillin: Secondary | ICD-10-CM | POA: Insufficient documentation

## 2014-10-12 DIAGNOSIS — Z87440 Personal history of urinary (tract) infections: Secondary | ICD-10-CM | POA: Diagnosis not present

## 2014-10-12 LAB — URINALYSIS, ROUTINE W REFLEX MICROSCOPIC
Bilirubin Urine: NEGATIVE
Glucose, UA: NEGATIVE mg/dL
Hgb urine dipstick: NEGATIVE
KETONES UR: NEGATIVE mg/dL
Leukocytes, UA: NEGATIVE
Nitrite: NEGATIVE
Protein, ur: NEGATIVE mg/dL
SPECIFIC GRAVITY, URINE: 1.026 (ref 1.005–1.030)
UROBILINOGEN UA: 0.2 mg/dL (ref 0.0–1.0)
pH: 7.5 (ref 5.0–8.0)

## 2014-10-12 LAB — CBC WITH DIFFERENTIAL/PLATELET
BASOS ABS: 0 10*3/uL (ref 0.0–0.1)
Basophils Relative: 0 % (ref 0–1)
EOS ABS: 0.3 10*3/uL (ref 0.0–0.7)
EOS PCT: 5 % (ref 0–5)
HCT: 36.1 % (ref 36.0–46.0)
Hemoglobin: 12.7 g/dL (ref 12.0–15.0)
LYMPHS PCT: 32 % (ref 12–46)
Lymphs Abs: 2.1 10*3/uL (ref 0.7–4.0)
MCH: 31.9 pg (ref 26.0–34.0)
MCHC: 35.2 g/dL (ref 30.0–36.0)
MCV: 90.7 fL (ref 78.0–100.0)
Monocytes Absolute: 0.4 10*3/uL (ref 0.1–1.0)
Monocytes Relative: 7 % (ref 3–12)
NEUTROS PCT: 56 % (ref 43–77)
Neutro Abs: 3.8 10*3/uL (ref 1.7–7.7)
PLATELETS: 283 10*3/uL (ref 150–400)
RBC: 3.98 MIL/uL (ref 3.87–5.11)
RDW: 12.8 % (ref 11.5–15.5)
WBC: 6.7 10*3/uL (ref 4.0–10.5)

## 2014-10-12 LAB — BASIC METABOLIC PANEL
ANION GAP: 12 (ref 5–15)
BUN: 11 mg/dL (ref 6–23)
CO2: 26 mEq/L (ref 19–32)
Calcium: 9.3 mg/dL (ref 8.4–10.5)
Chloride: 102 mEq/L (ref 96–112)
Creatinine, Ser: 0.78 mg/dL (ref 0.50–1.10)
Glucose, Bld: 87 mg/dL (ref 70–99)
POTASSIUM: 4.3 meq/L (ref 3.7–5.3)
SODIUM: 140 meq/L (ref 137–147)

## 2014-10-12 LAB — PREGNANCY, URINE: PREG TEST UR: NEGATIVE

## 2014-10-12 MED ORDER — GI COCKTAIL ~~LOC~~
30.0000 mL | Freq: Once | ORAL | Status: AC
  Administered 2014-10-12: 30 mL via ORAL
  Filled 2014-10-12: qty 30

## 2014-10-12 MED ORDER — OMEPRAZOLE 20 MG PO CPDR: 20.0000 mg | DELAYED_RELEASE_CAPSULE | Freq: Two times a day (BID) | ORAL | Status: DC

## 2014-10-12 NOTE — ED Provider Notes (Signed)
CSN: [id number]     Arrival date & time 10/12/14  1418 History   First MD Initiated Contact with Patient 10/12/14 1716     Chief Complaint  Patient presents with  . Abdominal Pain     (Consider location/radiation/quality/duration/timing/severity/associated sxs/prior Treatment) Patient is a 18 y.o. female presenting with abdominal pain. The history is provided by the patient. No language interpreter was used.  Abdominal Pain Pain location:  Epigastric Pain severity:  Moderate Onset quality:  Gradual Duration:  1 week Associated symptoms: nausea   Associated symptoms: no chills, no fever and no vomiting   Associated symptoms comment:  Epigastric pain that has been intermittent over the last week and constant today. No worse or better with eating. No change in appetite. She denies nausea, vomiting, change in bowel movements, cough, SOB or chest pain. The discomfort radiates into the back. She denies regular use of NSAIDs or history of ulcer. She has not tried taking anything for symptoms.    Past Medical History  Diagnosis Date  . Depression   . Allergy   . Eating disorder   . UTI (urinary tract infection) 10/03/2013  . Foster care (status) 10/18/2013   History reviewed. No pertinent past surgical history. No family history on file. History  Substance Use Topics  . Smoking status: Light Tobacco Smoker -- 0.25 packs/day for 1 years    Types: Cigarettes  . Smokeless tobacco: Never Used     Comment: states smokes occasionally not daily  . Alcohol Use: Yes     Comment: occasionally   OB History    No data available     Review of Systems  Constitutional: Negative for fever and chills.  HENT: Negative.   Respiratory: Negative.   Cardiovascular: Negative.   Gastrointestinal: Positive for nausea and abdominal pain. Negative for vomiting.  Genitourinary: Negative.   Musculoskeletal: Negative.   Skin: Negative.   Neurological: Negative.       Allergies  Amoxicillin and  Tomato  Home Medications   Prior to Admission medications   Medication Sig Start Date End Date Taking? Authorizing Provider  hydrOXYzine (ATARAX/VISTARIL) 25 MG tablet Take 1 tablet (25 mg total) by mouth 3 (three) times daily as needed for anxiety. Patient not taking: Reported on 10/12/2014 08/25/14   Freda Munro May Agustin, NP  QUEtiapine (SEROQUEL XR) 200 MG 24 hr tablet Take 1 tablet (200 mg total) by mouth at bedtime. For mood stabilization Patient not taking: Reported on 10/12/2014 08/25/14   [facility], NP  sertraline (ZOLOFT) 100 MG tablet Take 1 tablet (100 mg total) by mouth daily. For depression Patient not taking: Reported on 10/12/2014 08/25/14   Freda Munro May Agustin, NP   BP 140/96 mmHg  Pulse 61  Temp(Src) 98.6 F (37 C) (Oral)  Resp 16  Ht 5\' 1"  (1.549 m)  Wt 182 lb (82.555 kg)  BMI 34.41 kg/m2  SpO2 100%  LMP 10/02/2014 Physical Exam  Constitutional: She is oriented to person, place, and time. She appears well-developed and well-nourished.  HENT:  Head: Normocephalic.  Neck: Normal range of motion. Neck supple.  Cardiovascular: Normal rate and regular rhythm.   Pulmonary/Chest: Effort normal and breath sounds normal.  Abdominal: Soft. Bowel sounds are normal. There is no tenderness. There is no rebound and no guarding.  Musculoskeletal: Normal range of motion.  Neurological: She is alert and oriented to person, place, and time.  Skin: Skin is warm and dry. No rash noted.  Psychiatric: She has a normal mood and  affect.    ED Course  Procedures (including critical care time) Labs Review Labs Reviewed  URINALYSIS, ROUTINE W REFLEX MICROSCOPIC - Abnormal; Notable for the following:    APPearance HAZY (*)    All other components within normal limits  BASIC METABOLIC PANEL  CBC WITH DIFFERENTIAL  PREGNANCY, URINE    Imaging Review No results found.   EKG Interpretation None      MDM   Final diagnoses:  None    1. Abdominal pain  Normal VS,  lab studies and exam. Feel symptoms related to indigestion without evidence to support concern for other acute process.     Dewaine Oats, PA-C 10/12/14 [place] Ward, DO 10/13/14 [phone]

## 2014-10-12 NOTE — ED Notes (Signed)
Patient states abdominal pain, ongoing problem, midline upper pain, nausea and diarrhea associated

## 2014-10-12 NOTE — Discharge Instructions (Signed)
Indigestion °Indigestion is discomfort in the upper abdomen that is caused by underlying problems such as gastroesophageal reflux disease (GERD), ulcers, or gallbladder problems.  °CAUSES  °Indigestion can be caused by many things. Possible causes include: °· Stomach acid in the esophagus. °· Stomach infections, usually caused by the bacteria H. pylori. °· Being overweight. °· Hiatal hernia. This means part of the stomach pushes up through the diaphragm. °· Overeating. °· Emotional problems, such as stress, anxiety, or depression. °· Poor nutrition. °· Consuming too much alcohol, tobacco, or caffeine. °· Consuming spicy foods, fats, peppermint, chocolate, tomato products, citrus, or fruit juices. °· Medicines such as aspirin and other anti-inflammatory drugs, hormones, steroids, and thyroid medicines. °· Gastroparesis. This is a condition in which the stomach does not empty properly. °· Stomach cancer. °· Pregnancy, due to an increase in hormone levels, a relaxation of muscles in the digestive tract, and pressure on the stomach from the growing fetus. °SYMPTOMS  °· Uncomfortable feeling of fullness after eating. °· Pain or burning sensation in the upper abdomen. °· Bloating. °· Belching and gas. °· Nausea and vomiting. °· Acidic taste in the mouth. °· Burning sensation in the chest (heartburn). °DIAGNOSIS  °Your caregiver will review your medical history and perform a physical exam. Other tests, such as blood tests, stool tests, X-rays, and other imaging scans, may be done to check for more serious problems. °TREATMENT  °Liquid antacids and other drugs may be given to block stomach acid secretion. Medicines that increase esophageal muscle tone may also be given to help reduce symptoms. If an infection is found, antibiotic medicine may be given. °HOME CARE INSTRUCTIONS °· Avoid foods and drinks that make your symptoms worse, such as: °¨ Caffeine or alcoholic drinks. °¨ Chocolate. °¨ Peppermint or mint  flavorings. °¨ Garlic and onions. °¨ Spicy foods. °¨ Citrus fruits, such as oranges, lemons, or limes. °¨ Tomato-based foods such as sauce, chili, salsa, and pizza. °¨ Fried and fatty foods. °· Avoid eating for the 3 hours prior to your bedtime. °· Eat small, frequent meals instead of large meals. °· Stop smoking if you smoke. °· Maintain a healthy weight. °· Wear loose-fitting clothing. Do not wear anything tight around your waist that causes pressure on your stomach. °· Raise the head of your bed 4 to 8 inches with wood blocks to help you sleep. Extra pillows will not help. °· Only take over-the-counter or prescription medicines as directed by your caregiver. °· Do not take aspirin, ibuprofen, or other nonsteroidal anti-inflammatory drugs (NSAIDs). °SEEK IMMEDIATE MEDICAL CARE IF:  °· You are not better after 2 days. °· You have chest pressure or pain that radiates up into your neck, arms, back, jaw, or upper abdomen. °· You have difficulty swallowing. °· You keep vomiting. °· You have black or bloody stools. °· You have a fever. °· You have dizziness, fainting, difficulty breathing, or heavy sweating. °· You have severe abdominal pain. °· You lose weight without trying. °MAKE SURE YOU: °· Understand these instructions. °· Will watch your condition. °· Will get help right away if you are not doing well or get worse. °Document Released: 12/04/2004 Document Revised: 01/19/2012 Document Reviewed: 06/11/2011 °Exit[employer]® Patient Information ©2015 [employer]. This information is not intended to replace advice given to you by your health care provider. Make sure you discuss any questions you have with your health care provider. ° °

## 2014-10-12 NOTE — ED Notes (Signed)
Patient given a gown to undress.

## 2014-10-23 ENCOUNTER — Emergency Department (HOSPITAL_COMMUNITY): Payer: Federal, State, Local not specified - PPO

## 2014-10-23 ENCOUNTER — Encounter (HOSPITAL_COMMUNITY): Payer: Self-pay | Admitting: *Deleted

## 2014-10-23 ENCOUNTER — Emergency Department (HOSPITAL_COMMUNITY)
Admission: EM | Admit: 2014-10-23 | Discharge: 2014-10-23 | Disposition: A | Discharge status: Home / Self Care | Payer: Federal, State, Local not specified - PPO | Attending: Emergency Medicine | Admitting: Emergency Medicine

## 2014-10-23 DIAGNOSIS — R0602 Shortness of breath: Secondary | ICD-10-CM | POA: Diagnosis not present

## 2014-10-23 DIAGNOSIS — Z880 Allergy status to penicillin: Secondary | ICD-10-CM | POA: Insufficient documentation

## 2014-10-23 DIAGNOSIS — J020 Streptococcal pharyngitis: Secondary | ICD-10-CM | POA: Insufficient documentation

## 2014-10-23 DIAGNOSIS — Z87440 Personal history of urinary (tract) infections: Secondary | ICD-10-CM | POA: Diagnosis not present

## 2014-10-23 DIAGNOSIS — Z720 Tobacco use: Secondary | ICD-10-CM | POA: Insufficient documentation

## 2014-10-23 DIAGNOSIS — F329 Major depressive disorder, single episode, unspecified: Secondary | ICD-10-CM | POA: Insufficient documentation

## 2014-10-23 DIAGNOSIS — R590 Localized enlarged lymph nodes: Secondary | ICD-10-CM | POA: Insufficient documentation

## 2014-10-23 LAB — RAPID STREP SCREEN (MED CTR MEBANE ONLY): Streptococcus, Group A Screen (Direct): NEGATIVE

## 2014-10-23 MED ORDER — CLINDAMYCIN HCL 150 MG PO CAPS: 300.0000 mg | ORAL_CAPSULE | Freq: Three times a day (TID) | ORAL | Status: DC

## 2014-10-23 MED ORDER — ACETAMINOPHEN 160 MG/5ML PO LIQD: 640.0000 mg | ORAL | Status: DC | PRN

## 2014-10-23 MED ORDER — ACETAMINOPHEN 160 MG/5ML PO SOLN
650.0000 mg | Freq: Once | ORAL | Status: AC
  Administered 2014-10-23: 650 mg via ORAL
  Filled 2014-10-23: qty 20.3

## 2014-10-23 NOTE — ED Provider Notes (Signed)
CSN: [id number]     Arrival date & time 10/23/14  0906 History   First MD Initiated Contact with Patient 10/23/14 [phone]     Chief Complaint  Patient presents with  . Shortness of Breath  . Sore Throat  . Oral Swelling     (Consider location/radiation/quality/duration/timing/severity/associated sxs/prior Treatment) HPI Comments: Patient is a 18 year old female who presents with a 1 day history of sore throat. Patient reports gradual onset and progressively worsening sharp, severe throat pain. The pain is constant and made worse with swallowing. The pain is localized to the patient's throat and equal on both sides. Nothing alleviates the pain. The patient has not tried anything for symptom relief. Patient reports associated chills and painful swallowing. Patient denies headache, visual changes, sinus congestion, difficulty breathing, chest pain, SOB, abdominal pain, NVD.     Patient is a 18 y.o. female presenting with shortness of breath and pharyngitis.  Shortness of Breath Associated symptoms: sore throat   Associated symptoms: no abdominal pain, no chest pain, no fever, no neck pain and no vomiting   Sore Throat Associated symptoms include chills and a sore throat. Pertinent negatives include no abdominal pain, arthralgias, chest pain, fatigue, fever, nausea, neck pain, vomiting or weakness.    Past Medical History  Diagnosis Date  . Depression   . Allergy   . Eating disorder   . UTI (urinary tract infection) 10/03/2013  . Foster care (status) 10/18/2013   History reviewed. No pertinent past surgical history. History reviewed. No pertinent family history. History  Substance Use Topics  . Smoking status: Light Tobacco Smoker -- 0.25 packs/day for 1 years    Types: Cigarettes  . Smokeless tobacco: Never Used     Comment: states smokes occasionally not daily  . Alcohol Use: Yes     Comment: occasionally   OB History    No data available     Review of Systems   Constitutional: Positive for chills. Negative for fever and fatigue.  HENT: Positive for sore throat. Negative for trouble swallowing.   Eyes: Negative for visual disturbance.  Respiratory: Positive for shortness of breath.   Cardiovascular: Negative for chest pain and palpitations.  Gastrointestinal: Negative for nausea, vomiting, abdominal pain and diarrhea.  Genitourinary: Negative for dysuria and difficulty urinating.  Musculoskeletal: Negative for arthralgias and neck pain.  Skin: Negative for color change.  Neurological: Negative for dizziness and weakness.  Psychiatric/Behavioral: Negative for dysphoric mood.      Allergies  Amoxicillin and Tomato  Home Medications   Prior to Admission medications   Medication Sig Start Date End Date Taking? Authorizing Provider  hydrOXYzine (ATARAX/VISTARIL) 25 MG tablet Take 1 tablet (25 mg total) by mouth 3 (three) times daily as needed for anxiety. Patient not taking: Reported on 10/12/2014 08/25/14   Freda Munro May Agustin, NP  omeprazole (PRILOSEC) 20 MG capsule Take 1 capsule (20 mg total) by mouth 2 (two) times daily before a meal. Patient not taking: Reported on 10/23/2014 10/12/14   Nehemiah Settle A Upstill, PA-C  QUEtiapine (SEROQUEL XR) 200 MG 24 hr tablet Take 1 tablet (200 mg total) by mouth at bedtime. For mood stabilization Patient not taking: Reported on 10/12/2014 08/25/14   [facility], NP  sertraline (ZOLOFT) 100 MG tablet Take 1 tablet (100 mg total) by mouth daily. For depression Patient not taking: Reported on 10/12/2014 08/25/14   Freda Munro May Agustin, NP   BP 131/82 mmHg  Pulse 89  Temp(Src) 99.7 F (37.6 C) (Oral)  SpO2 98%  LMP 10/02/2014 Physical Exam  Constitutional: She is oriented to person, place, and time. She appears well-developed and well-nourished. No distress.  HENT:  Head: Normocephalic and atraumatic.  Bilateral tonsillar edema, erythema and exudate. No trismus.   Eyes: Conjunctivae and EOM are normal.   Neck: Normal range of motion.  Cardiovascular: Normal rate and regular rhythm.  Exam reveals no gallop and no friction rub.   No murmur heard. Pulmonary/Chest: Effort normal and breath sounds normal. She has no wheezes. She has no rales. She exhibits no tenderness.  Abdominal: Soft. She exhibits no distension. There is no tenderness. There is no rebound.  Musculoskeletal: Normal range of motion.  Lymphadenopathy:    She has cervical adenopathy.  Neurological: She is alert and oriented to person, place, and time. Coordination normal.  Speech is goal-oriented. Moves limbs without ataxia.   Skin: Skin is warm and dry.  Psychiatric: She has a normal mood and affect. Her behavior is normal.  Nursing note and vitals reviewed.   ED Course  Procedures (including critical care time) Labs Review Labs Reviewed  RAPID STREP SCREEN  CULTURE, GROUP A STREP    Imaging Review Dg Chest 2 View  10/23/2014   CLINICAL DATA:  Productive cough, chills, sore throat, and difficulty swallowing  EXAM: CHEST  2 VIEW  COMPARISON:  None.  FINDINGS: The lungs are adequately inflated and clear. The heart and mediastinal structures are normal. There is no pleural effusion or pneumothorax. The trachea is midline. The bony thorax is unremarkable.  IMPRESSION: There is no active cardiopulmonary disease.   Electronically Signed   By: David  Martinique   On: 10/23/2014 10:02     EKG Interpretation None      MDM   Final diagnoses:  SOB (shortness of breath)  Strep throat    10:25 AM Patient's rapid strep and chest xray is unremarkable. Patient has a low grade temp of 99.7 with remaining vitals stable. Patient will be treated for strep throat based on clinical appearance. Patient is PERC negative. Patient reports SOB is due to throat being swollen.    Alvina Chou, PA-C 10/23/14 [place], MD 10/23/14 1046

## 2014-10-23 NOTE — ED Notes (Signed)
Sore throat since 10 pm last night. States, "difficulty swallowing and having chills." no respiratory distress. VS: 138/76, hr 97, rr 16, sao2 97%, alert and oriented.

## 2014-10-23 NOTE — Discharge Instructions (Signed)
Take clindamycin as directed until gone. Refer to attached documents for more information.

## 2014-10-25 LAB — CULTURE, GROUP A STREP

## 2014-11-07 ENCOUNTER — Encounter (HOSPITAL_COMMUNITY): Payer: Self-pay | Admitting: *Deleted

## 2014-11-07 ENCOUNTER — Emergency Department (HOSPITAL_COMMUNITY)
Admission: EM | Admit: 2014-11-07 | Discharge: 2014-11-08 | Discharge status: Against Medical Advice | Payer: Federal, State, Local not specified - PPO | Attending: Emergency Medicine | Admitting: Emergency Medicine

## 2014-11-07 DIAGNOSIS — Z113 Encounter for screening for infections with a predominantly sexual mode of transmission: Secondary | ICD-10-CM | POA: Diagnosis not present

## 2014-11-07 DIAGNOSIS — Z720 Tobacco use: Secondary | ICD-10-CM | POA: Diagnosis not present

## 2014-11-07 NOTE — ED Notes (Signed)
Pt in requesting to be tested for STDs, states her boyfriend has a rash on his penis and she needs to be checked, denies symptoms

## 2014-11-07 NOTE — ED Notes (Signed)
Pt's name called to update vitals no answer

## 2014-11-07 NOTE — ED Notes (Signed)
Pt's name called x2 no answer

## 2014-11-24 ENCOUNTER — Ambulatory Visit: Payer: Self-pay

## 2014-12-11 ENCOUNTER — Ambulatory Visit: Payer: Medicaid Other | Admitting: *Deleted

## 2015-02-28 ENCOUNTER — Inpatient Hospital Stay (HOSPITAL_COMMUNITY)
Admission: AD | Admit: 2015-02-28 | Discharge: 2015-03-03 | DRG: 883 | Disposition: A | Discharge status: Home / Self Care | Payer: Medicaid Other | Attending: Psychiatry | Admitting: Psychiatry

## 2015-02-28 ENCOUNTER — Encounter (HOSPITAL_COMMUNITY): Payer: Self-pay | Admitting: *Deleted

## 2015-02-28 DIAGNOSIS — Z91410 Personal history of adult physical and sexual abuse: Secondary | ICD-10-CM

## 2015-02-28 DIAGNOSIS — F17210 Nicotine dependence, cigarettes, uncomplicated: Secondary | ICD-10-CM | POA: Diagnosis present

## 2015-02-28 DIAGNOSIS — Z915 Personal history of self-harm: Secondary | ICD-10-CM

## 2015-02-28 DIAGNOSIS — Z79899 Other long term (current) drug therapy: Secondary | ICD-10-CM | POA: Diagnosis not present

## 2015-02-28 DIAGNOSIS — F603 Borderline personality disorder: Principal | ICD-10-CM | POA: Diagnosis present

## 2015-02-28 DIAGNOSIS — F4310 Post-traumatic stress disorder, unspecified: Secondary | ICD-10-CM | POA: Diagnosis present

## 2015-02-28 DIAGNOSIS — F411 Generalized anxiety disorder: Secondary | ICD-10-CM | POA: Diagnosis present

## 2015-02-28 DIAGNOSIS — F3341 Major depressive disorder, recurrent, in partial remission: Secondary | ICD-10-CM | POA: Diagnosis present

## 2015-02-28 DIAGNOSIS — Z62810 Personal history of physical and sexual abuse in childhood: Secondary | ICD-10-CM | POA: Diagnosis present

## 2015-02-28 DIAGNOSIS — R45851 Suicidal ideations: Secondary | ICD-10-CM | POA: Diagnosis present

## 2015-02-28 DIAGNOSIS — F332 Major depressive disorder, recurrent severe without psychotic features: Secondary | ICD-10-CM | POA: Diagnosis present

## 2015-02-28 DIAGNOSIS — Z590 Homelessness: Secondary | ICD-10-CM

## 2015-02-28 HISTORY — DX: Anxiety disorder, unspecified: F41.9

## 2015-02-28 LAB — CBC
HEMATOCRIT: 36.7 % (ref 36.0–46.0)
HEMOGLOBIN: 12.3 g/dL (ref 12.0–15.0)
MCH: 30.8 pg (ref 26.0–34.0)
MCHC: 33.5 g/dL (ref 30.0–36.0)
MCV: 91.8 fL (ref 78.0–100.0)
Platelets: 231 10*3/uL (ref 150–400)
RBC: 4 MIL/uL (ref 3.87–5.11)
RDW: 13 % (ref 11.5–15.5)
WBC: 7.2 10*3/uL (ref 4.0–10.5)

## 2015-02-28 LAB — COMPREHENSIVE METABOLIC PANEL
ALBUMIN: 4 g/dL (ref 3.5–5.2)
ALT: 12 U/L (ref 0–35)
ANION GAP: 5 (ref 5–15)
AST: 22 U/L (ref 0–37)
Alkaline Phosphatase: 68 U/L (ref 39–117)
BUN: 12 mg/dL (ref 6–23)
CO2: 27 mmol/L (ref 19–32)
CREATININE: 0.83 mg/dL (ref 0.50–1.10)
Calcium: 9 mg/dL (ref 8.4–10.5)
Chloride: 107 mmol/L (ref 96–112)
GFR calc Af Amer: >90 mL/min (ref >90)
GFR calc non Af Amer: >90 mL/min (ref >90)
Glucose, Bld: 70 mg/dL (ref 70–99)
Potassium: 4.1 mmol/L (ref 3.5–5.1)
Sodium: 139 mmol/L (ref 135–145)
TOTAL PROTEIN: 7.2 g/dL (ref 6.0–8.3)
Total Bilirubin: 0.6 mg/dL (ref 0.3–1.2)

## 2015-02-28 LAB — HEPATIC FUNCTION PANEL
ALT: 13 U/L (ref 0–35)
AST: 21 U/L (ref 0–37)
Albumin: 4 g/dL (ref 3.5–5.2)
Alkaline Phosphatase: 67 U/L (ref 39–117)
Bilirubin, Direct: 0.1 mg/dL (ref 0.0–0.5)
Indirect Bilirubin: 0.3 mg/dL (ref 0.3–0.9)
Total Bilirubin: 0.4 mg/dL (ref 0.3–1.2)
Total Protein: 7.2 g/dL (ref 6.0–8.3)

## 2015-02-28 LAB — ETHANOL: Alcohol, Ethyl (B): <5 mg/dL (ref 0–9)

## 2015-02-28 LAB — TSH: TSH: 0.448 u[IU]/mL (ref 0.350–4.500)

## 2015-02-28 MED ORDER — QUETIAPINE FUMARATE ER 200 MG PO TB24
200.0000 mg | ORAL_TABLET | Freq: Every day | ORAL | Status: DC
  Administered 2015-02-28 – 2015-03-02 (×3): 200 mg via ORAL
  Filled 2015-02-28: qty 7
  Filled 2015-02-28 (×2): qty 1
  Filled 2015-02-28: qty 7
  Filled 2015-02-28 (×3): qty 1

## 2015-02-28 MED ORDER — SERTRALINE HCL 50 MG PO TABS
50.0000 mg | ORAL_TABLET | Freq: Every day | ORAL | Status: DC
  Administered 2015-02-28 – 2015-03-03 (×4): 50 mg via ORAL
  Filled 2015-02-28: qty 1
  Filled 2015-02-28: qty 7
  Filled 2015-02-28 (×5): qty 1
  Filled 2015-02-28: qty 7

## 2015-02-28 MED ORDER — MAGNESIUM HYDROXIDE 400 MG/5ML PO SUSP: 30.0000 mL | Freq: Every day | ORAL | Status: DC | PRN

## 2015-02-28 MED ORDER — PANTOPRAZOLE SODIUM 40 MG PO TBEC
40.0000 mg | DELAYED_RELEASE_TABLET | Freq: Every day | ORAL | Status: DC
  Administered 2015-02-28 – 2015-03-03 (×4): 40 mg via ORAL
  Filled 2015-02-28 (×7): qty 1

## 2015-02-28 MED ORDER — ACETAMINOPHEN 325 MG PO TABS: 650.0000 mg | ORAL_TABLET | Freq: Four times a day (QID) | ORAL | Status: DC | PRN

## 2015-02-28 MED ORDER — ALUM & MAG HYDROXIDE-SIMETH 200-200-20 MG/5ML PO SUSP: 30.0000 mL | ORAL | Status: DC | PRN

## 2015-02-28 MED ORDER — HYDROXYZINE HCL 25 MG PO TABS: 25.0000 mg | ORAL_TABLET | Freq: Three times a day (TID) | ORAL | Status: DC | PRN

## 2015-02-28 NOTE — Tx Team (Signed)
Initial Interdisciplinary Treatment Plan   PATIENT STRESSORS: Financial difficulties Legal issue Occupational concerns   PATIENT STRENGTHS: Average or above average intelligence General fund of knowledge Physical Health   PROBLEM LIST: Problem List/Patient Goals Date to be addressed Date deferred Reason deferred Estimated date of resolution  "I need to work on my emotions" 02/28/2015     Homelessness 02/28/2015     Lack of family support 02/28/2015     Depression 02/28/2015     Suicidal Ideation 02/28/2015                              DISCHARGE CRITERIA:  Ability to meet basic life and health needs Adequate post-discharge living arrangements Improved stabilization in mood, thinking, and/or behavior Motivation to continue treatment in a less acute level of care Need for constant or close observation no longer present Verbal commitment to aftercare and medication compliance  PRELIMINARY DISCHARGE PLAN: Outpatient therapy Placement in alternative living arrangements  PATIENT/FAMIILY INVOLVEMENT: This treatment plan has been presented to and reviewed with the patient, Deanna Valencia.  The patient and family have been given the opportunity to ask questions and make suggestions.  [name] 02/28/2015, 4:16 PM

## 2015-02-28 NOTE — Progress Notes (Signed)
Nursing admission note:  Patient brought in directly from EMS.  Patient was at the [facility] today and she had cut her right arm.  EMS was called at that time.  Patient lives in a tent with her boyfriend who is her sole support.  She is unemployed and has no family support.  She states she did not cut due to a suicide attempt.  Patient states she has been cutting since she was 19 years old.  She cuts and she feels "numb."  She also stated that she had a court date today and "left to take my medications."  When asked what legal charges she had, she stated, "I have too many charges.  I have 11 charges pending right now."  Patient does have a hx of suicide attempts.  She was last here in 2015 for SI to jump off a bridge.  She also has attempted to overdose in the past.  Patient denies any etoh or drug use.  She also reports auditory command hallucinations.  She reports visuals hallucinations of "seeing shadows."  Patient has no pertinent med hx.  Patient has hx of inpatient admissions to C/A and adult unit.  She was oriented to room and unit.
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
## 2015-03-01 ENCOUNTER — Encounter (HOSPITAL_COMMUNITY): Payer: Self-pay | Admitting: Psychiatry

## 2015-03-01 DIAGNOSIS — F411 Generalized anxiety disorder: Secondary | ICD-10-CM | POA: Diagnosis present

## 2015-03-01 DIAGNOSIS — F3341 Major depressive disorder, recurrent, in partial remission: Secondary | ICD-10-CM | POA: Diagnosis present

## 2015-03-01 DIAGNOSIS — F4310 Post-traumatic stress disorder, unspecified: Secondary | ICD-10-CM | POA: Diagnosis present

## 2015-03-01 DIAGNOSIS — F603 Borderline personality disorder: Secondary | ICD-10-CM | POA: Diagnosis present

## 2015-03-01 LAB — URINALYSIS, ROUTINE W REFLEX MICROSCOPIC
BILIRUBIN URINE: NEGATIVE
GLUCOSE, UA: NEGATIVE mg/dL
HGB URINE DIPSTICK: NEGATIVE
KETONES UR: NEGATIVE mg/dL
Leukocytes, UA: NEGATIVE
Nitrite: NEGATIVE
PROTEIN: 30 mg/dL — AB
Specific Gravity, Urine: 1.026 (ref 1.005–1.030)
Urobilinogen, UA: 0.2 mg/dL (ref 0.0–1.0)
pH: 6 (ref 5.0–8.0)

## 2015-03-01 LAB — RAPID URINE DRUG SCREEN, HOSP PERFORMED
AMPHETAMINES: NOT DETECTED
BARBITURATES: NOT DETECTED
BENZODIAZEPINES: NOT DETECTED
COCAINE: NOT DETECTED
Opiates: NOT DETECTED
Tetrahydrocannabinol: POSITIVE — AB

## 2015-03-01 LAB — LIPID PANEL
CHOLESTEROL: 173 mg/dL (ref 0–200)
HDL: 65 mg/dL (ref >39)
LDL Cholesterol: 90 mg/dL (ref 0–99)
TRIGLYCERIDES: 89 mg/dL (ref <150)
Total CHOL/HDL Ratio: 2.7 RATIO
VLDL: 18 mg/dL (ref 0–40)

## 2015-03-01 LAB — PREGNANCY, URINE: Preg Test, Ur: NEGATIVE

## 2015-03-01 LAB — URINE MICROSCOPIC-ADD ON

## 2015-03-01 MED ORDER — BACITRACIN-NEOMYCIN-POLYMYXIN 400-5-5000 EX OINT
TOPICAL_OINTMENT | Freq: Two times a day (BID) | CUTANEOUS | Status: DC
  Administered 2015-03-02 – 2015-03-03 (×2): via TOPICAL

## 2015-03-01 NOTE — Progress Notes (Signed)
D: Pt presents with flat affect. Pt has minimal interaction on the unit. Pt isolates in room throughout the shift. Pt noncompliant with attending groups. Pt denies any suicidal thoughts today. Pt denies any depression. Pt reported that she slept well last night and that her appetite is fair. Pt reported that she makes cuts to her arm to cope, not with the intent to kill herself. Pt contracts not to make cuts to herself and for safety. Pt compliant with taking meds. No adverse reactions to meds verbalized by pt.  A: Medications administered as ordered per MD. Jenetta Downer support given. Pt encouraged to attend groups. Per Dr. Shea Evans, pt was given a rubber band to help cope. 15 minute checks performed for safety.  R: Pt safety maintained at this time.

## 2015-03-01 NOTE — Progress Notes (Signed)
Adult Psychoeducational Group Note  Date:  03/01/2015 Time:  0900  Group Topic/Focus:  Leisure and lifestyle changes   Participation Level:  Did Not Attend  Participation Quality:    Affect:    Cognitive:    Insight:   Engagement in Group:    Modes of Intervention:    Additional Comments:    Deshante Cassell L 03/01/2015, 3:20 PM
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
## 2015-03-01 NOTE — BHH Suicide Risk Assessment (Signed)
[place] INPATIENT:  Family/Significant Other Suicide Prevention Education  Suicide Prevention Education:  Education Completed; Lissa Morales, Boyfriend, [phone]; has been identified by the patient as the family member/significant other with whom the patient will be residing, and identified as the person(s) who will aid the patient in the event of a mental health crisis (suicidal ideations/suicide attempt).  With written consent from the patient, the family member/significant other has been provided the following suicide prevention education, prior to the and/or following the discharge of the patient.  The suicide prevention education provided includes the following:  Suicide risk factors  Suicide prevention and interventions  National Suicide Hotline telephone number  [facility] assessment telephone number  [facility] Emergency Assistance [place] and/or Residential Mobile Crisis Unit telephone number  Request made of family/significant other to:  Remove weapons (e.g., guns, rifles, knives), all items previously/currently identified as safety concern.   Boyfriend advised patient does not have access to weapons.    Remove drugs/medications (over-the-counter, prescriptions, illicit drugs), all items previously/currently identified as a safety concern.  The family member/significant other verbalizes understanding of the suicide prevention education information provided.  The family member/significant other agrees to remove the items of safety concern listed above.  Concha Pyo 03/01/2015, 8:48 AM

## 2015-03-01 NOTE — BHH Suicide Risk Assessment (Signed)
[facility] Admission Suicide Risk Assessment   Nursing information obtained from:    Demographic factors:    Current Mental Status:    Loss Factors:    Historical Factors:    Risk Reduction Factors:    Total Time spent with patient: 30 minutes Principal Problem: Borderline personality disorder Diagnosis:   Patient Active Problem List   Diagnosis Date Noted  . Borderline personality disorder [F60.3] 03/01/2015  . Generalized anxiety disorder [F41.1] 03/01/2015  . 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 PTSD (post-traumatic stress disorder) [F43.10] 03/01/2015  . High risk sexual behavior [Z72.51] 07/09/2014  . Chlamydia [A74.9] 10/11/2013  . ODD (oppositional defiant disorder) [F91.3] 03/09/2013     Continued Clinical Symptoms:  Alcohol Use Disorder Identification Test Final Score (AUDIT): 0 The "Alcohol Use Disorders Identification Test", Guidelines for Use in Primary Care, Second Edition.  World Pharmacologist [facility]). Score between 0-7:  no or low risk or alcohol related problems. Score between 8-15:  moderate risk of alcohol related problems. Score between 16-19:  high risk of alcohol related problems. Score 20 or above:  warrants further diagnostic evaluation for alcohol dependence and treatment.   CLINICAL FACTORS:   Alcohol/Substance Abuse/Dependencies Personality Disorders:   Cluster B Comorbid alcohol abuse/dependence Comorbid depression Previous Psychiatric Diagnoses and Treatments   Musculoskeletal: Strength & Muscle Tone: within normal limits Gait & Station: normal Patient leans: N/A  Psychiatric Specialty Exam: Physical Exam  ROS  Blood pressure 101/81, pulse 67, temperature 98.6 F (37 C), temperature source Oral, resp. rate 18, height 5' 0.75" (1.543 m), weight 81.194 kg (179 lb).Body mass index is 34.1 kg/(m^2).          Please see H&P.                                                 COGNITIVE FEATURES THAT CONTRIBUTE TO RISK:  Closed-mindedness, Polarized thinking and Thought constriction (tunnel vision)    SUICIDE RISK:   Moderate:  Frequent suicidal ideation with limited intensity, and duration, some specificity in terms of plans, no associated intent, good self-control, limited dysphoria/symptomatology, some risk factors present, and identifiable protective factors, including available and accessible social support.  PLAN OF CARE: Please see H&P.   Medical Decision Making:  Review of Psycho-Social Stressors (1), Review or order clinical lab tests (1), Review and summation of old records (2), Established Problem, Worsening (2), Review of Last Therapy Session (1), Review of Medication Regimen & Side Effects (2) and Review of New Medication or Change in Dosage (2)  I certify that inpatient services furnished can reasonably be expected to improve the patient's condition.   Kerrianne Jeng md 03/01/2015, 11:20 AM

## 2015-03-01 NOTE — BHH Group Notes (Signed)
[place] LCSW Group Therapy  03/01/2015 3:22 PM  Type of Therapy:  Group Therapy  Participation Level:  Did Not Attend - patient in bed.  Deanna Valencia 03/01/2015, 3:22 PM

## 2015-03-01 NOTE — Progress Notes (Signed)
Pt attended evening Karaoke group, was engaged and participated in singing.

## 2015-03-01 NOTE — Progress Notes (Signed)
Patient ID: Deanna Valencia, female   DOB: [birth year], 19 y.o.   MRN: [id number]   D: Pt was laying in the bed prior to the assessment. Informed the Probation officer that she was at [facility] and that the staff there contacted EMS. Pt stated she went to get her meds and had been cutting, however, the bleeding would not stop. Pt stated she wasn't attempting to kill herself, but that she has a history of cutting and it's the "only way she's learned to cope". Stated she has problems coping when she's angry or sad. Pt stated she is currently participating in a court ordered anger management class.  A:  Support and encouragement was offered. 15 min checks continued for safety.  R: Pt remains safe.

## 2015-03-02 LAB — HEMOGLOBIN A1C
HEMOGLOBIN A1C: 5.2 % (ref 4.8–5.6)
Mean Plasma Glucose: 103 mg/dL

## 2015-03-02 NOTE — Tx Team (Signed)
Interdisciplinary Treatment Plan Update (Adult) Date: 03/02/2015   Time Reviewed: 9:30 AM  Progress in Treatment: Attending groups: No Participating in groups: No Taking medication as prescribed: Yes Tolerating medication: Yes Family/Significant other contact made: Yes, CSW has spoken with patient's boyfriend Patient understands diagnosis: Yes Discussing patient identified problems/goals with staff: Yes Medical problems stabilized or resolved: Yes Denies suicidal/homicidal ideation: Yes Issues/concerns per patient self-inventory: Yes Other:  New problem(s) identified: N/A  Discharge Plan or Barriers:   4/22: Patient plans to return home to follow up with outpatient services.  Reason for Continuation of Hospitalization:  Depression Anxiety Medication Stabilization   Comments: N/A  Estimated length of stay: Discharge anticipated for Saturday 03/03/15.  For review of initial/current patient goals, please see plan of care.  Attendees: Patient:    Family:    Physician: Dr. Parke Poisson; Dr. Sabra Heck 03/02/2015 9:30 AM  Nursing: Markham Jordan, Charlyne Quale, Nehemiah Settle , RN 03/02/2015 9:30 AM  Clinical Social Worker: Tilden Fossa,  [place] 03/02/2015 9:30 AM  Other: Maxie Better, LCSWA  03/02/2015 9:30 AM  Other:  03/02/2015 9:30 AM  Other: Lars Pinks, Case Manager 03/02/2015 9:30 AM  Other: Ave Filter NP 03/02/2015 9:30 AM  Other:    Other:    Other:    Other:    Other:       Scribe for Treatment Team:  Tilden Fossa, MSW, [employer] [phone]

## 2015-03-02 NOTE — Progress Notes (Signed)
D: Pt informed the writer that she was "confused" about the diagnoses. Stated that at [facility] she was given a "list of new diagnoses". Stated she was started on klonopin and neurontin, but that it's been changed now and per the dr she doesn't have the diagnoses that she was given at [employer]. Pt had a visit today from her boyfriend and workers at [facility].  A:  Support and encouragement was offered. 15 min checks continued for safety.  R: Pt remains safe.
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
## 2015-03-02 NOTE — Progress Notes (Signed)
Pt stated that she had a good and is looking forward to going home tomorrow.

## 2015-03-02 NOTE — Progress Notes (Signed)
Patient ID: Deanna Valencia, female   DOB: 05/31/1996, 19 y.o.   MRN: [id number] Pt currently presents with a blunted affect and anxious behavior. Per self inventory, pt rates depression at a 0, hopelessness 0 and anxiety 3. Pt's daily goal is "learning to control my emotions" and they intend to do so by "find positive coping skills." Pt reports good sleep, concentration and appetite. Pt states "I want to go home tomorrow" with a smile on her face.   Pt provided with medications per providers orders. Pt's labs and vitals were monitored throughout the day. Pt supported emotionally and encouraged to express concerns and questions. Pt educated on medications.  Pt's safety ensured with 15 minute and environmental checks. Pt currently denies SI/HI and A/V hallucinations. Pt verbally agrees to seek staff if SI/HI or A/VH occurs and to consult with staff before acting on these thoughts. Pt to be discharged around 11am tomorrow. She has an appointment at [facility] at 2 and is taking public transportation to get to her apt.

## 2015-03-02 NOTE — Progress Notes (Signed)
  [facility] Adult Case Management Discharge Plan :  Will you be returning to the same living situation after discharge:  Yes,  patient plans to return to current living situation at discharge. At discharge, do you have transportation home?: Yes,  patient will be provided with bus pass Do you have the ability to pay for your medications: Yes,  patient will be provided with medication samples and prescriptions  Release of information consent forms completed and in the chart;  Patient's signature needed at discharge.  Patient to Follow up at: Follow-up Information    Follow up with [facility].   Why:  Walk in clinic Monday-Friday between 8 am to 3 pm for therapy and medication management services.   Contact information:   [address] [place], [place]   [address] [phone]      Patient denies SI/HI: Yes,  denies    Safety Planning and Suicide Prevention discussed: Yes,  with patient and boyfriend  Have you used any form of tobacco in the last 30 days? (Cigarettes, Smokeless Tobacco, Cigars, and/or Pipes): Yes  Has patient been referred to the Quitline?: Patient refused referral  Jaleia Hanke, Casimiro Needle 03/02/2015, 5:32 PM

## 2015-03-02 NOTE — Plan of Care (Signed)
Problem: Diagnosis: Increased Risk For Suicide Attempt Goal: STG-Patient Will Report Suicidal Feelings to Staff Outcome: Progressing Patient currently denies suicidal ideation and reports she will seek staff if thoughts come.
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
## 2015-03-02 NOTE — BHH Group Notes (Signed)
   [facility] LCSW Aftercare Discharge Planning Group Note  03/02/2015  8:45 AM   Participation Quality: Alert, Appropriate and Oriented  Mood/Affect: Appropriate  Depression Rating: 0  Anxiety Rating: 3  Thoughts of Suicide: Pt denies SI/HI  Will you contract for safety? Yes  Current AVH: Pt denies  Plan for Discharge/Comments: Pt attended discharge planning group and actively participated in group. CSW provided pt with today's workbook. Patient reports feeling "better" today. She reports signing a 72 hour request that will expire on Saturday. Patient plans to return home to follow up with outpatient services at discharge.  Transportation Means: Pt reports access to transportation  Supports: No supports mentioned at this time  Tilden Fossa, MSW, [place] Social Worker [employer] [phone]

## 2015-03-02 NOTE — Clinical Social Work Note (Signed)
CSW offered patient information regarding shelters but patient declined, stating that she plans to return back to [place] with her boyfriend. Patient reports that [facility] program is assisting with case management needs and housing. Patient reports that she plans to follow up at [facility] for outpatient services. Letter for probation officer and bus pass placed on chart.   Tilden Fossa, MSW, [place] Worker [facility] [phone]

## 2015-03-02 NOTE — Plan of Care (Signed)
Problem: Diagnosis: Increased Risk For Suicide Attempt Goal: LTG-Patient Will Report Improved Mood and Deny Suicidal LTG (by discharge) Patient will report improved mood and deny suicidal ideation.  Outcome: Progressing Pt states "I feel better. I'm excited to go home."

## 2015-03-03 DIAGNOSIS — F4310 Post-traumatic stress disorder, unspecified: Secondary | ICD-10-CM

## 2015-03-03 DIAGNOSIS — F603 Borderline personality disorder: Principal | ICD-10-CM

## 2015-03-03 DIAGNOSIS — F411 Generalized anxiety disorder: Secondary | ICD-10-CM

## 2015-03-03 DIAGNOSIS — Z915 Personal history of self-harm: Secondary | ICD-10-CM

## 2015-03-03 DIAGNOSIS — F3341 Major depressive disorder, recurrent, in partial remission: Secondary | ICD-10-CM

## 2015-03-03 MED ORDER — SERTRALINE HCL 50 MG PO TABS: 50.0000 mg | ORAL_TABLET | Freq: Every day | ORAL | Status: DC

## 2015-03-03 MED ORDER — QUETIAPINE FUMARATE ER 200 MG PO TB24: 200.0000 mg | ORAL_TABLET | Freq: Every day | ORAL | Status: DC

## 2015-03-03 NOTE — Progress Notes (Signed)
Nursing discharge note:  Patient discharged per MD order.  Patient received all personal belongings, prescriptions and medication samples.  Patient denies SI/HI/AVH.  Patient will follow up with [facility] for medication management.  Patient has bus ticket for transportation.  She plans to go to the [facility] for possible housing.
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
## 2015-03-03 NOTE — Progress Notes (Signed)
Writer observed patient up in the dayroom watching tv and interacting with select peers. She reports that she is excited about being discharged on tomorrow so that she can see her boyfriend. Writer informed hero fher scheduled hs medication for tonight. She denies si/hi/a/v hallucinations. Support and encouragment given, safety maintained on unit with 15 min checks.

## 2015-03-07 DIAGNOSIS — Z0271 Encounter for disability determination: Secondary | ICD-10-CM

## 2015-03-07 NOTE — Progress Notes (Signed)
Patient Discharge Instructions:  After Visit Summary (AVS):   Faxed to:  03/07/15 Discharge Summary Note:   Faxed to:  03/07/15 Psychiatric Admission Assessment Note:   Faxed to:  03/07/15 Suicide Risk Assessment - Discharge Assessment:   Faxed to:  03/07/15 Faxed/Sent to the Next Level Care provider:  03/07/15 Faxed to [facility] [place], 03/07/2015, 2:42 PM

## 2015-07-26 ENCOUNTER — Emergency Department (HOSPITAL_COMMUNITY)
Admission: EM | Admit: 2015-07-26 | Discharge: 2015-07-26 | Disposition: A | Discharge status: Home / Self Care | Payer: Medicaid Other | Attending: Emergency Medicine | Admitting: Emergency Medicine

## 2015-07-26 ENCOUNTER — Encounter (HOSPITAL_COMMUNITY): Payer: Self-pay | Admitting: Emergency Medicine

## 2015-07-26 DIAGNOSIS — Z720 Tobacco use: Secondary | ICD-10-CM | POA: Diagnosis not present

## 2015-07-26 DIAGNOSIS — F419 Anxiety disorder, unspecified: Secondary | ICD-10-CM | POA: Insufficient documentation

## 2015-07-26 DIAGNOSIS — Z79899 Other long term (current) drug therapy: Secondary | ICD-10-CM | POA: Diagnosis not present

## 2015-07-26 DIAGNOSIS — Z880 Allergy status to penicillin: Secondary | ICD-10-CM | POA: Insufficient documentation

## 2015-07-26 DIAGNOSIS — Z87440 Personal history of urinary (tract) infections: Secondary | ICD-10-CM | POA: Insufficient documentation

## 2015-07-26 DIAGNOSIS — L02412 Cutaneous abscess of left axilla: Secondary | ICD-10-CM | POA: Insufficient documentation

## 2015-07-26 DIAGNOSIS — F329 Major depressive disorder, single episode, unspecified: Secondary | ICD-10-CM | POA: Diagnosis not present

## 2015-07-26 MED ORDER — IBUPROFEN 600 MG PO TABS: 600.0000 mg | ORAL_TABLET | Freq: Four times a day (QID) | ORAL | Status: DC | PRN

## 2015-07-26 MED ORDER — LIDOCAINE-EPINEPHRINE (PF) 2 %-1:200000 IJ SOLN
10.0000 mL | Freq: Once | INTRAMUSCULAR | Status: DC
  Filled 2015-07-26: qty 20

## 2015-07-26 MED ORDER — DOXYCYCLINE HYCLATE 100 MG PO CAPS: 100.0000 mg | ORAL_CAPSULE | Freq: Two times a day (BID) | ORAL | Status: DC

## 2015-07-26 NOTE — ED Provider Notes (Signed)
CSN: [id number]     Arrival date & time 07/26/15  1226 History  This chart was scribed for non-physician practitioner Jeannett Senior, PA-C, working with Veryl Speak, MD, by Eustaquio Maize, ED Scribe. This patient was seen in room TR08C/TR08C and the patient's care was started at 1:13 PM.  Chief Complaint  Patient presents with  . Abscess   The history is provided by the patient. No language interpreter was used.     HPI Comments: Deanna Valencia is a 19 y.o. female who presents to the Emergency Department complaining of 2 abscess to left axillary region x 2 weeks. Pt notes increased tenderness and swelling to the area. She notes pain radiating down her left arm as well. Pt has difficulty raising arm due to the pain. She has not taken anything for the pain or swelling. Denies fever, chills, or any other associated symptoms.    Past Medical History  Diagnosis Date  . Depression   . Allergy   . Eating disorder   . UTI (urinary tract infection) 10/03/2013  . Foster care (status) 10/18/2013  . Anxiety    History reviewed. No pertinent past surgical history. History reviewed. No pertinent family history. Social History  Substance Use Topics  . Smoking status: Light Tobacco Smoker -- 0.25 packs/day for 1 years    Types: Cigarettes  . Smokeless tobacco: Never Used     Comment: states smokes occasionally not daily  . Alcohol Use: Yes     Comment: occasionally   OB History    No data available     Review of Systems  Constitutional: Negative for fever and chills.  Musculoskeletal: Positive for arthralgias (Left arm pain).  Skin:       2 abscess to left axillary region   Allergies  Amoxicillin and Tomato  Home Medications   Prior to Admission medications   Medication Sig Start Date End Date Taking? Authorizing Provider  QUEtiapine (SEROQUEL XR) 200 MG 24 hr tablet Take 1 tablet (200 mg total) by mouth at bedtime. 03/03/15   Knox Royalty, NP  sertraline (ZOLOFT) 50 MG  tablet Take 1 tablet (50 mg total) by mouth daily. 03/03/15   Knox Royalty, NP   Triage Vitals: BP 143/99 mmHg  Pulse 61  Temp(Src) 98.3 F (36.8 C) (Oral)  Resp 16  Ht 5\' 1"  (1.549 m)  Wt 176 lb (79.833 kg)  BMI 33.27 kg/m2  SpO2 99%   Physical Exam  Constitutional: She is oriented to person, place, and time. She appears well-developed and well-nourished. No distress.  HENT:  Head: Normocephalic and atraumatic.  Eyes: Conjunctivae and EOM are normal.  Neck: Neck supple. No tracheal deviation present.  Cardiovascular: Normal rate.   Pulmonary/Chest: Effort normal. No respiratory distress.  Musculoskeletal: Normal range of motion.  Neurological: She is alert and oriented to person, place, and time.  Skin: Skin is warm and dry.  Large, approximately 5 cm abscess to the left axilla. There is no surrounding erythema or swelling. Abscesses tender to palpation, fluctuant, warm to the touch.  Psychiatric: She has a normal mood and affect. Her behavior is normal.  Nursing note and vitals reviewed.   ED Course  Procedures (including critical care time)  DIAGNOSTIC STUDIES: Oxygen Saturation is 99% on RA, normal by my interpretation.    COORDINATION OF CARE: 1:17 PM-Discussed treatment plan which includes I&D with pt at bedside and pt agreed to plan.   Labs Review Labs Reviewed - No data to display  Imaging Review No results found.    EKG Interpretation None       INCISION AND DRAINAGE Performed by: Jeannett Senior A Consent: Verbal consent obtained. Risks and benefits: risks, benefits and alternatives were discussed Type: abscess  Body area: left axilla  Anesthesia: local infiltration  Incision was made with a scalpel.  Local anesthetic: lidocaine 2% w epinephrine  Anesthetic total: 4 ml  Complexity: complex Blunt dissection to break up loculations  Drainage: purulent  Drainage amount: large  Packing material: 1/4 in iodoform gauze  Patient  tolerance: Patient tolerated the procedure well with no immediate complications.     MDM   Final diagnoses:  Abscess of left axilla   Patient with a left axillary abscess, history of the same. Incised and drained with large purulent drainage. PACS. Will start doxycycline, ibuprofen for pain. Warm compresses. Wound care discussed. Patient is to pull a packing in 2 days, or follow-up if it is not improving. Return precautions discussed. She is afebrile, nontoxic appearing. No evidence of systemic infection.  Filed Vitals:   07/26/15 1231 07/26/15 1350  BP: 143/99 139/84  Pulse: 61 57  Temp: 98.3 F (36.8 C) 97.7 F (36.5 C)  TempSrc: Oral Oral  Resp: 16 12  Height: 5\' 1"  (1.549 m)   Weight: 176 lb (79.833 kg)   SpO2: 99% 100%     Jeannett Senior, PA-C 07/26/15 [place], MD 07/26/15 1557

## 2015-07-26 NOTE — Discharge Instructions (Signed)
Ibuprofen for pain. Warm compresses. Doxycyline for infection until all gone. Pull packing out in 2 days. Follow up as needed.    Abscess Care After An abscess (also called a boil or furuncle) is an infected area that contains a collection of pus. Signs and symptoms of an abscess include pain, tenderness, redness, or hardness, or you may feel a moveable soft area under your skin. An abscess can occur anywhere in the body. The infection may spread to surrounding tissues causing cellulitis. A cut (incision) by the surgeon was made over your abscess and the pus was drained out. Gauze may have been packed into the space to provide a drain that will allow the cavity to heal from the inside outwards. The boil may be painful for 5 to 7 days. Most people with a boil do not have high fevers. Your abscess, if seen early, may not have localized, and may not have been lanced. If not, another appointment may be required for this if it does not get better on its own or with medications. HOME CARE INSTRUCTIONS   Only take over-the-counter or prescription medicines for pain, discomfort, or fever as directed by your caregiver.  When you bathe, soak and then remove gauze or iodoform packs at least daily or as directed by your caregiver. You may then wash the wound gently with mild soapy water. Repack with gauze or do as your caregiver directs. SEEK IMMEDIATE MEDICAL CARE IF:   You develop increased pain, swelling, redness, drainage, or bleeding in the wound site.  You develop signs of generalized infection including muscle aches, chills, fever, or a general ill feeling.  An oral temperature above 102 F (38.9 C) develops, not controlled by medication. See your caregiver for a recheck if you develop any of the symptoms described above. If medications (antibiotics) were prescribed, take them as directed. Document Released: 05/15/2005 Document Revised: 01/19/2012 Document Reviewed: 01/10/2008 [facility] Patient  Information 2015 [place], [place]. This information is not intended to replace advice given to you by your health care provider. Make sure you discuss any questions you have with your health care provider.

## 2015-07-26 NOTE — ED Notes (Signed)
Pt sts abscess in left axillary area with swelling

## 2015-07-31 ENCOUNTER — Encounter: Payer: Self-pay | Admitting: Pediatrics

## 2015-07-31 ENCOUNTER — Ambulatory Visit (INDEPENDENT_AMBULATORY_CARE_PROVIDER_SITE_OTHER): Payer: Medicaid Other | Admitting: Licensed Clinical Social Worker

## 2015-07-31 ENCOUNTER — Ambulatory Visit (INDEPENDENT_AMBULATORY_CARE_PROVIDER_SITE_OTHER): Payer: Medicaid Other | Admitting: Pediatrics

## 2015-07-31 VITALS — BP 139/90 | HR 79 | Ht 60.5 in | Wt 180.0 lb

## 2015-07-31 DIAGNOSIS — Z113 Encounter for screening for infections with a predominantly sexual mode of transmission: Secondary | ICD-10-CM | POA: Diagnosis not present

## 2015-07-31 DIAGNOSIS — F3341 Major depressive disorder, recurrent, in partial remission: Secondary | ICD-10-CM

## 2015-07-31 DIAGNOSIS — Z1389 Encounter for screening for other disorder: Secondary | ICD-10-CM

## 2015-07-31 DIAGNOSIS — F411 Generalized anxiety disorder: Secondary | ICD-10-CM

## 2015-07-31 DIAGNOSIS — L732 Hidradenitis suppurativa: Secondary | ICD-10-CM | POA: Diagnosis not present

## 2015-07-31 LAB — POCT URINE PREGNANCY: PREG TEST UR: NEGATIVE

## 2015-07-31 NOTE — BH Specialist Note (Addendum)
Referring Provider: Dr. Gaspar Skeeters PCP: No primary care provider on file. Session Time:  10:55 -11:20(25 min) Type of Service: [place] Interpreter: No.  Interpreter Name & Language: NA   PRESENTING CONCERNS:  Deanna Valencia is a 19 y.o. female brought in by patient. Deanna Valencia was referred to [facility] for mood concerns and resources support.   GOALS ADDRESSED:  Goal development including family planning and relationship Identify barriers to social emotional development    INTERVENTIONS:  Assessed current conditions Build rapport Discussed Integrated Care Discussed secondary screens Motivational Interviewing Relationship training Substance use assessment Supportive counseling    ASSESSMENT/OUTCOME:  Deanna Valencia was receptive to this meeting, participatory, and candid in most areas. She was guarded talking about mom but shared some family history.   She talked about her past and current experiences with counseling and learnings from anger management. She is not interested in additional referrals at this time. Coping includes walking and journaling. She looks for triggers to being angry and tries to calm down.  Deanna Valencia engaged in high-risk behaviors. Discussed at length, MI used to try to create ambivalence. Deanna Valencia was thoughtful during this conversation.   She receives [facility] for mental health diagnoses. Previously gotten [facility] at [facility]. Patient is connected to the [facility] for peer support. Patient is connecting to [facility]. She is interested in psychology and in child development.  Discussed family history. Deanna Valencia is not close to mother, there appears to be a history but patient did not want to talk about it today.    TREATMENT PLAN:  Continue harm reduction Continue community connects as these are helpful Continue positive coping Deanna Valencia will return to [employer] in the fall to study psychology If she gets pregnant, she will address  high-risk behaviors and her attitude.  She voiced agreement    PLAN FOR NEXT VISIT: None scheduled as patient feels like she is coping well.    Scheduled next visit: None with this Probation officer but welcomed in the future.   [facility]

## 2015-07-31 NOTE — Patient Instructions (Addendum)
Getting healthy before pregnancy  A healthy pregnancy starts before you get pregnant. It's important to know what you can do before pregnancy to help you have a healthy pregnancy and a healthy baby.  What can you do to have a healthy pregnancy someday? . Get a preconception checkup. This is a medical checkup you get before pregnancy to make sure you're healthy when you get pregnant. Once you're pregnant, get early and regular prenatal care. . Check to see if your vaccinations are up to date. Rubella ([place] measles) and chickenpox can cause birth defects and other problems if you get them during pregnancy. . Get a dental checkup. Keep up your regular dental checkups before and during pregnancy. If you have gum disease, getting treatment before pregnancy may prevent health problems in you and your baby. . Take a multivitamin with 400 micrograms of folic acid every day. Folic acid is a B vitamin that every cell in your body needs for healthy growth and development. If you take it before and during early pregnancy, it can help prevent birth defects of the brain and spine called neural tube defects (NTDs). . Eat healthy foods and get to a healthy weight. Being at a healthy weight before pregnancy may help you avoid health problems in you or your baby. Overweight, obese and underweight women are more likely than woman of a healthy weight to have pregnancy problems. Eat healthy during pregnancy and learn more about what foods to avoid during pregnancy. . Don't smoke, drink alcohol, use street drugs or take prescription drugs that aren't prescribed for you. And stay away from secondhand smoke. All of these things can harm your baby during pregnancy. Tell your health care Djuna Frechette if you need help to quit. . Learn about your family health history. This is a record of any health conditions and treatments that you, your partner and everyone in your families have had. It can help you make important  health choices in your life. Marland Kitchen Keep safe from toxoplasmosis by not eating undercooked meat or changing your cat's litter box. Undercooked meat and cat poop may have parasite in them that causes an infection called toxoplasmosis. Toxoplasmosis can cause birth defects. Marland Kitchen Keep safe from pets that are rodents, like hamsters, mice and [place] pigs. Rodents can carry a virus called lymphocytic choriomeningitis (LCMV) that can harm your baby. . Stay away from harmful chemicals, like paint thinner. Some chemicals may increase your chances of having a baby with birth defects. . Get help if you've been abused by your partner. Abuse often gets worse during pregnancy. . Reduce the stress in your life. Too much stress can cause problems during pregnanc

## 2015-07-31 NOTE — Progress Notes (Signed)
Pre-Visit Planning  Deanna Valencia  is a 19 y.o. female referred by No primary care provider on file..   Last seen in [facility] on 06/30/2014 for hidradenitis left axilla, high risk sexual behavior, positive chlamydia.   Previous Psych Screenings?  yes  Treatment plan at last visit included treatment for all STIs, testing completed as well.   Clinical Staff Visit Tasks:   - Urine GC/CT due? yes - Psych Screenings Due? yes, PHQSADs - [facility] - Birth Control Handout  Provider Visit Tasks: - Assess current home and safety situation, previously working as Conservator, museum/gallery - Assess mood, previous [place] admission - [facility] involvement may be indicated - Check Trich, HIV, RPR - Pertinent Labs? no

## 2015-07-31 NOTE — Progress Notes (Signed)
THIS RECORD MAY CONTAIN CONFIDENTIAL INFORMATION THAT SHOULD NOT BE RELEASED WITHOUT REVIEW OF THE SERVICE PROVIDER.  Adolescent Medicine Consultation Follow-Up Visit Deanna Valencia  is a 19 y.o. female referred by No ref. provider found here today for follow-up of multiple issues.     Growth Chart Viewed? not applicable   History was provided by the patient.  PCP Confirmed?  no  My Chart Activated?   yes   Previsit planning completed:  yes Pre-Visit Planning  Deanna Valencia  is a 19 y.o. female referred by No primary care provider on file..   Last seen in [facility] on 06/30/2014 for hidradenitis left axilla, high risk sexual behavior, positive chlamydia.   Previous Psych Screenings?  yes  Treatment plan at last visit included treatment for all STIs, testing completed as well.   Clinical Staff Visit Tasks:   - Urine GC/CT due? yes - Psych Screenings Due? yes, PHQSADs - [facility] - Birth Control Handout  Provider Visit Tasks: - Assess current home and safety situation, previously working as Conservator, museum/gallery - Assess mood, previous [place] admission - [facility] involvement may be indicated - Check Trich, HIV, RPR - Pertinent Labs? no  HPI:    Had hidradenitis.  Drained in the ER,  Still can feel it.  Having pain in her forearm as well. Reports she is supposed to be taking zoloft and seroquel, followed through [place] Was hospitalized for depression, was homeless, was really stressed Staying in an apartment with her boyfriend, moved in July  Was at [facility], was going everyday, was enrolled in program that helped her get program Planning to go to [facility] [facility] - wants to go to [employer]  Discussed pregnancy planning, feels okay if she gets pregnant, feels she would be supported Has support from her boyfriend's family. Not interested in birth control   Smokes cigarettes,    Patient's last menstrual period was 06/13/2015. Allergies  Allergen Reactions  . Amoxicillin  Hives  . Tomato Itching and Rash     Medication List       This list is accurate as of: 07/31/15 11:59 PM.  Always use your most recent med list.               QUEtiapine 200 MG 24 hr tablet  Commonly known as:  SEROQUEL XR  Take 1 tablet (200 mg total) by mouth at bedtime.     sertraline 50 MG tablet  Commonly known as:  ZOLOFT  Take 1 tablet (50 mg total) by mouth daily.       The following portions of the patient's history were reviewed and updated as appropriate: allergies, current medications and problem list.  Physical Exam:  Filed Vitals:   07/31/15 1025  BP: 139/90  Pulse: 79  Height: 5' 0.5" (1.537 m)  Weight: 180 lb (81.647 kg)   BP 139/90 mmHg  Pulse 79  Ht 5' 0.5" (1.537 m)  Wt 180 lb (81.647 kg)  BMI 34.56 kg/m2  LMP 06/13/2015 Body mass index: body mass index is 34.56 kg/(m^2). Blood pressure percentiles are 626% systolic and 948% diastolic based on 5462 NHANES data. Blood pressure percentile targets: 90: 119/75, 95: 123/79, 99 + 5 mmHg: 135/92.  Smoked a cigarette right before her visit  Physical Exam  Constitutional: No distress.  Neck: No thyromegaly present.  Cardiovascular: Normal rate and regular rhythm.   Pulmonary/Chest: Breath sounds normal.  Abdominal: Soft. She exhibits no mass. There is no tenderness. There is no guarding.  Musculoskeletal: She exhibits no edema.  Lymphadenopathy:    She has no cervical adenopathy.     Assessment/Plan: 19 yo female with complex social history presents today for follow-up regarding hidradenitis.  She does have psych support currently in place.  She is not taking medications that are prescribed.  Advised to consider discussing this more openly with psych care provider.  She declines birth control.  We discussed importance of preconception health prior to getting pregnant at length.   1. Hidradenitis suppurativa of left axilla - Ambulatory referral to General Surgery  2. Routine screening for STI  (sexually transmitted infection) - GC/chlamydia probe amp, urine - RPR - HIV antibody  3. Screening for genitourinary condition - POCT urine pregnancy  4. Generalized anxiety disorder 5. MDD (major depressive disorder), recurrent, in partial remission Follow-up with psych care team as discussed.   Follow-up:  Return in about 1 month (around 08/30/2015) for Lab results review, with Alyse Low, with Dr. Henrene Pastor.   Medical decision-making:  > 25 minutes spent, more than 50% of appointment was spent discussing diagnosis and management of symptoms

## 2015-08-01 ENCOUNTER — Encounter: Payer: Self-pay | Admitting: Pediatrics

## 2015-08-01 LAB — GC/CHLAMYDIA PROBE AMP, URINE
Chlamydia, Swab/Urine, PCR: NEGATIVE
GC Probe Amp, Urine: NEGATIVE

## 2015-08-01 LAB — HIV ANTIBODY (ROUTINE TESTING W REFLEX): HIV 1&2 Ab, 4th Generation: NONREACTIVE

## 2015-08-01 LAB — RPR

## 2015-08-30 ENCOUNTER — Encounter: Payer: Self-pay | Admitting: Pediatrics

## 2015-08-30 NOTE — Progress Notes (Signed)
Pre-Visit Planning  Deanna Valencia  is a 19 y.o. female referred by No primary care provider on file..   Last seen in [facility] on 07/31/2015 for hidradenitis suppourative, STI screening, anxiety and depression.   Previous Psych Screenings?  yes, 07/31/2015  Treatment plan at last visit included referral to general surgery, STI testing, continue with psych team, discussed preconception health due to no birth control in place.   Clinical Staff Visit Tasks:   - Urine GC/CT due? no - Psych Screenings Due? no [facility]  Provider Visit Tasks: - Ensure gen surg referral in place - Ensure psych care in place - Discuss care transition - Review preconception health - Pertinent Labs? yes,  Component     Latest Ref Rng 07/31/2015  Chlamydia, Swab/Urine, PCR     NEGATIVE NEGATIVE  GC Probe Amp, Urine     NEGATIVE NEGATIVE  Preg Test, Ur     Negative Negative  RPR     NON REAC NON REAC  HIV     NONREACTIVE NONREACTIVE

## 2015-08-31 ENCOUNTER — Ambulatory Visit (INDEPENDENT_AMBULATORY_CARE_PROVIDER_SITE_OTHER): Payer: Medicaid Other | Admitting: Clinical

## 2015-08-31 ENCOUNTER — Ambulatory Visit (INDEPENDENT_AMBULATORY_CARE_PROVIDER_SITE_OTHER): Payer: Medicaid Other | Admitting: Pediatrics

## 2015-08-31 ENCOUNTER — Encounter: Payer: Self-pay | Admitting: Pediatrics

## 2015-08-31 VITALS — BP 122/80 | HR 68 | Ht 61.0 in | Wt 179.6 lb

## 2015-08-31 DIAGNOSIS — L732 Hidradenitis suppurativa: Secondary | ICD-10-CM

## 2015-08-31 DIAGNOSIS — F3341 Major depressive disorder, recurrent, in partial remission: Secondary | ICD-10-CM | POA: Diagnosis not present

## 2015-08-31 DIAGNOSIS — N644 Mastodynia: Secondary | ICD-10-CM | POA: Diagnosis not present

## 2015-08-31 DIAGNOSIS — F411 Generalized anxiety disorder: Secondary | ICD-10-CM | POA: Diagnosis not present

## 2015-08-31 DIAGNOSIS — Z3202 Encounter for pregnancy test, result negative: Secondary | ICD-10-CM | POA: Diagnosis not present

## 2015-08-31 LAB — POCT URINE PREGNANCY: Preg Test, Ur: NEGATIVE

## 2015-08-31 NOTE — Patient Instructions (Addendum)
Start taking a multivitamin with folate.  Keep working on preconception health.  Breast Tenderness Breast tenderness is a common problem for women of all ages. Breast tenderness may cause mild discomfort to severe pain. It has a variety of causes. Your health care provider will find out the likely cause of your breast tenderness by examining your breasts, asking you about symptoms, and ordering some tests. Breast tenderness usually does not mean you have breast cancer. HOME CARE INSTRUCTIONS  Breast tenderness often can be handled at home. You can try:  Getting fitted for a new bra that provides more support, especially during exercise.  Wearing a more supportive bra or sports bra while sleeping when your breasts are very tender.  If you have a breast injury, apply ice to the area:  Put ice in a plastic bag.  Place a towel between your skin and the bag.  Leave the ice on for 20 minutes, 2-3 times a day.  If your breasts are too full of milk as a result of breastfeeding, try:  Expressing milk either by hand or with a breast pump.  Applying a warm compress to the breasts for relief.  Taking over-the-counter pain relievers, if approved by your health care provider.  Taking other medicines that your health care provider prescribes. These may include antibiotic medicines or birth control pills. Over the long term, your breast tenderness might be eased if you:  Cut down on caffeine.  Reduce the amount of fat in your diet. Keep a log of the days and times when your breasts are most tender. This will help you and your health care provider find the cause of the tenderness and how to relieve it. Also, learn how to do breast exams at home. This will help you notice if you have an unusual growth or lump that could cause tenderness. SEEK MEDICAL CARE IF:   Any part of your breast is hard, red, and hot to the touch. This could be a sign of infection.  Fluid is coming out of your nipples (and  you are not breastfeeding). Especially watch for blood or pus.  You have a fever as well as breast tenderness.  You have a new or painful lump in your breast that remains after your menstrual period ends.  You have tried to take care of the pain at home, but it has not gone away.  Your breast pain is getting worse, or the pain is making it hard to do the things you usually do during your day.   This information is not intended to replace advice given to you by your health care provider. Make sure you discuss any questions you have with your health care provider.   Document Released: 10/09/2008 Document Revised: 06/29/2013 Document Reviewed: 05/26/2013 Elsevier Interactive Patient Education [employer].

## 2015-08-31 NOTE — Progress Notes (Signed)
THIS RECORD MAY CONTAIN CONFIDENTIAL INFORMATION THAT SHOULD NOT BE RELEASED WITHOUT REVIEW OF THE SERVICE PROVIDER.  Adolescent Medicine Consultation Follow-Up Visit Deanna Valencia  is a 19 y.o. female referred by No ref. provider found here today for follow-up.    Growth Chart Viewed? not applicable   History was provided by the patient. PCP Confirmed?  no My Chart Activated?   yes  Previsit planning completed:  yes  Deanna Valencia is a 19 y.o. female referred by No primary care provider on file..  Last seen in [facility] on 07/31/2015 for hidradenitis suppourative, STI screening, anxiety and depression.   Previous Psych Screenings? yes, 07/31/2015  Treatment plan at last visit included referral to general surgery, STI testing, continue with psych team, discussed preconception health due to no birth control in place.   Clinical Staff Visit Tasks:  - Urine GC/CT due? no - Psych Screenings Due? no [facility]  Provider Visit Tasks: - Ensure gen surg referral in place - Ensure psych care in place - Discuss care transition - Review preconception health - Pertinent Labs? yes,  Component  Latest Ref Rng 07/31/2015  Chlamydia, Swab/Urine, PCR  NEGATIVE NEGATIVE  GC Probe Amp, Urine  NEGATIVE NEGATIVE  Preg Test, Ur  Negative Negative  RPR  NON REAC NON REAC  HIV  NONREACTIVE NONREACTIVE          HPI:    [place] (ACT team), groups twice weekly, going well. Has general surgery appt for assessment of hidradenitis Feels in a healthy place  Having right breast pain, shooting pain, started 4-5 days ago, Sore on right side of her breast, sharp, shooting pain for a few seconds.  NO change in physical activity, Sleeps on her side.  Not waking her at night.  Occurs mid to late afternoon.  No nipple discharge.  Breast stimulation from partner.  NO lumps.  No fever.    Patient's last menstrual period  was 08/11/2015. Allergies  Allergen Reactions  . Amoxicillin Hives  . Tomato Itching and Rash   No current outpatient prescriptions on file prior to visit.   No current facility-administered medications on file prior to visit.    Physical Exam:  Filed Vitals:   08/31/15 1546  BP: 122/80  Pulse: 68  Height: 5\' 1"  (1.549 m)  Weight: 179 lb 9.6 oz (81.466 kg)   BP 122/80 mmHg  Pulse 68  Ht 5\' 1"  (1.549 m)  Wt 179 lb 9.6 oz (81.466 kg)  BMI 33.95 kg/m2  LMP 08/11/2015 Body mass index: body mass index is 33.95 kg/(m^2). Facility age limit for growth percentiles is 20 years.  Physical Exam  Constitutional: No distress.  Neck: No thyromegaly present.  Pulmonary/Chest: Right breast exhibits no inverted nipple, no mass, no nipple discharge, no skin change and no tenderness. Left breast exhibits no inverted nipple, no mass, no nipple discharge, no skin change and no tenderness.  Lymphadenopathy:    She has no cervical adenopathy.     Assessment/Plan: 1. Breast pain, right Appears to be ligament related, no masses or abnormalities on exam.  Discussed increasing bra support, warm compresses and NSAIDs.  F/u if not improving.  2. Hidradenitis suppurativa of left axilla Referral to Gen Surg in place  3. Generalized anxiety disorder 4. MDD (major depressive disorder), recurrent, in partial remission ([place]) Continue work with treatment team.  5. Pregnancy examination or test, negative result - Reviewed preconception health - POCT urine pregnancy   Discussed transition  to adult care in the next year.  Advised patient of long waiting list for adult medicine clinics so important to begin to explore options now.  Follow-up:  Return if symptoms worsen or fail to improve.   Medical decision-making:  > 15 minutes spent, more than 50% of appointment was spent discussing diagnosis and management of symptoms

## 2015-08-31 NOTE — BH Specialist Note (Signed)
Referring Provider: Dr. Gaspar Skeeters PCP: No primary care provider on file. Session Time:  5003-7048 (10 min) Type of Service: [place] Interpreter: No.  Interpreter Name & Language: NA   PRESENTING CONCERNS:  Deanna Valencia is a 19 y.o. female brought in by patient. Deanna Valencia was referred to [facility] for previous mood concerns and resources support.  Deanna Valencia is presenting for a follow up visit with Dr. Henrene Pastor.   GOALS ADDRESSED:  Identify barriers to social emotional development    INTERVENTIONS:  Assessed current conditions Reviewed support systems & coping skills Identified strengths & accomplishments.   ASSESSMENT/OUTCOME:  Deanna Valencia presented to be relaxed and open to talking with this [facility].  Deanna Valencia reported she continues to receive treatment through [facility] - ACT team and goes to groups twice a week, which she enjoys. Deanna Valencia reported ongoing use of positive coping skills instead of self-injurious behaviors.  Deanna Valencia reported her current concern was pain on her breasts today, which will be addressed at her visit today with Dr. Henrene Pastor.  Deanna Valencia no other concerns for this [facility] at this time.  Deanna Valencia was informed about transitioning to adult medical care in the future.   TREATMENT PLAN:  Continue to utilize [facility])   Scheduled next visit: None with this [facility] since she continues to have ongoing psycho social support.    No charge for this visit due to brief length of time.   [facility]

## 2015-09-17 ENCOUNTER — Encounter: Payer: Self-pay | Admitting: Pediatrics

## 2015-09-17 ENCOUNTER — Encounter: Payer: Self-pay | Admitting: Family

## 2015-09-17 NOTE — Progress Notes (Signed)
Patient ID: Deanna Valencia, female   DOB: Jul 27, 1996, 19 y.o.   MRN: [id number] Pre-Visit Planning  Deanna Valencia  is a 19 y.o. female referred by No primary care provider on file..   Last seen in [facility] on 08/31/15 for follow-up.   Previous Psych Screenings?  No  Treatment plan at last visit included supportive tx for right breast pain. -she was scheduled for surgery for hidradenitis of left axilla.  -she has [facility] (ACT team) groups twice weekly    Clinical Staff Visit Tasks:   - Urine GC/CT due? Yes - UHCG - Psych Screenings Due? No   Provider Visit Tasks: - Assess pg status, may need to order bHcg if urine inconclusive or negative - Update sexual hx, concern for STI re-screening?  - Pertinent Labs? No

## 2015-09-18 ENCOUNTER — Encounter: Payer: Self-pay | Admitting: Family

## 2015-09-18 ENCOUNTER — Ambulatory Visit (INDEPENDENT_AMBULATORY_CARE_PROVIDER_SITE_OTHER): Payer: Medicaid Other | Admitting: Family

## 2015-09-18 VITALS — BP 140/85 | HR 67 | Ht 59.75 in | Wt 180.0 lb

## 2015-09-18 DIAGNOSIS — R030 Elevated blood-pressure reading, without diagnosis of hypertension: Secondary | ICD-10-CM | POA: Diagnosis not present

## 2015-09-18 DIAGNOSIS — Z3202 Encounter for pregnancy test, result negative: Secondary | ICD-10-CM

## 2015-09-18 DIAGNOSIS — Z113 Encounter for screening for infections with a predominantly sexual mode of transmission: Secondary | ICD-10-CM | POA: Diagnosis not present

## 2015-09-18 DIAGNOSIS — IMO0001 Reserved for inherently not codable concepts without codable children: Secondary | ICD-10-CM

## 2015-09-18 DIAGNOSIS — N926 Irregular menstruation, unspecified: Secondary | ICD-10-CM

## 2015-09-18 LAB — POCT URINE PREGNANCY: Preg Test, Ur: NEGATIVE

## 2015-09-18 MED ORDER — PRENATAL VITAMINS PLUS 27-1 MG PO TABS: 1.0000 | ORAL_TABLET | Freq: Every day | ORAL | Status: DC

## 2015-09-18 NOTE — Patient Instructions (Addendum)
-  We will call you with the lab result from today.  -Also, I have sent in for prenatal vitamins.   There is no same day voter registeration, however you could register to vote for future elections.   Information here:  [email or web address]

## 2015-09-18 NOTE — Progress Notes (Addendum)
THIS RECORD MAY CONTAIN CONFIDENTIAL INFORMATION THAT SHOULD NOT BE RELEASED WITHOUT REVIEW OF THE SERVICE PROVIDER.  Adolescent Medicine Consultation Follow-Up Visit Deanna Valencia  is a 19 y.o. female referred by No ref. provider found here today for follow-up.    My Chart Activated?   no  Patient ID: Deanna Valencia, female   DOB: 07-15-1996, 19 y.o.   MRN: [id number] Pre-Visit Planning  Deanna Valencia  is a 19 y.o. female referred by No primary care provider on file..   Last seen in [facility] on 08/31/15 for follow-up.   Previous Psych Screenings?  No  Treatment plan at last visit included supportive tx for right breast pain. -she was scheduled for surgery for hidradenitis of left axilla.  -she has PSI (ACT team) groups twice weekly    Clinical Staff Visit Tasks:   - Urine GC/CT due? Yes - UHCG - Psych Screenings Due? No   Provider Visit Tasks: - Assess pg status, may need to order bHcg if urine inconclusive or negative - Update sexual hx, concern for STI re-screening?  - Pertinent Labs? No   Previsit planning completed:  Yes Patient ID: Deanna Valencia, female   DOB: [birth year], 19 y.o.   MRN: [id number] Pre-Visit Planning  [name]  is a 19 y.o. female referred by No primary care provider on file..   Last seen in [facility] on 08/31/15 for follow-up.   Previous Psych Screenings?  No  Treatment plan at last visit included supportive tx for right breast pain. -she was scheduled for surgery for hidradenitis of left axilla.  -she has PSI (ACT team) groups twice weekly    Clinical Staff Visit Tasks:   - Urine GC/CT due? Yes - UHCG - Psych Screenings Due? No   Provider Visit Tasks: - Assess pg status, may need to order bHcg if urine inconclusive or negative - Update sexual hx, concern for STI re-screening?  - Pertinent Labs? No    Growth Chart Viewed? no   History was provided by the patient.  PCP Confirmed?   None  HPI:    Took a HPT on 09/15/15 with a faint blue line shown; two subsequent tests were negative.  Today's urine test was negative. Patient not on contraception at this time. Was on Depo; gained weight, didn't like being on it. Off since 2014.  Started spotting on 10/27-10/29, 3 tampons used; and nothing since.  No concerns for STI screening; negative screen in September.  Having some mild cramping. Endorses constipation. No other gu/gyn concerns.  Usually has regular cycles, about 28 days apart so believes she may be pregnant d/t missed cycle.   She believes she ovulated around the 14 or 15th and reports being sexually active throughout that week.   When asked about her pregnancy intention, she reports she has not had time to process that she may be pregnant. States that there are financial and familial concerns with possibility of pregnancy, however she is unsure if she wants to be on a birth control method if she is not currently pregnant.   Patient's last menstrual period was 08/11/2015. Allergies  Allergen Reactions  . Amoxicillin Hives  . Tomato Itching and Rash   No current outpatient prescriptions on file prior to visit.   No current facility-administered medications on file prior to visit.    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  Patient's personal or confidential phone number: [phone] Tobacco?  Yes, one cigarette since yesterday.  Drugs/ETOH?  no Partner preference?  female Sexually Active?  yes   Pregnancy Prevention:  none, reviewed condoms & plan B Safe at home, in school & in relationships?  Yes Safe to self?  Yes    Review of Systems  Constitutional: Negative.   HENT: Negative.   Eyes: Negative.   Respiratory: Negative.   Cardiovascular:  Negative.   Gastrointestinal: Negative.   Genitourinary: Negative.   Musculoskeletal: Negative.   Skin: Negative.   Neurological: Negative.   Endo/Heme/Allergies: Negative.   Psychiatric/Behavioral: Negative.     The following portions of the patient's history were reviewed and updated as appropriate: allergies, current medications, past family history, past medical history, past social history, past surgical history and problem list.  Physical Exam:  Filed Vitals:   09/18/15 1000  BP: 140/85  Pulse: 67  Height: 4' 11.75" (1.518 m)  Weight: 180 lb (81.647 kg)   BP 140/85 mmHg  Pulse 67  Ht 4' 11.75" (1.518 m)  Wt 180 lb (81.647 kg)  BMI 35.43 kg/m2  LMP 08/11/2015 Body mass index: body mass index is 35.43 kg/(m^2). Facility age limit for growth percentiles is 20 years.  Physical Exam  Constitutional: She is oriented to person, place, and time. She appears well-developed. No distress.  HENT:  Head: Normocephalic and atraumatic.  Eyes: EOM are normal. Pupils are equal, round, and reactive to light. No scleral icterus.  Neck: Normal range of motion. Neck supple. No thyromegaly present.  Cardiovascular: Normal rate, regular rhythm, normal heart sounds and intact distal pulses.   No murmur heard. Pulmonary/Chest: Effort normal and breath sounds normal.  Abdominal: Soft.  Musculoskeletal: Normal range of motion. She exhibits no edema.  Lymphadenopathy:    She has no cervical adenopathy.  Neurological: She is alert and oriented to person, place, and time. No cranial nerve deficit.  Skin: Skin is warm and dry. No rash noted.  Acne on forehead, chin, cheeks  Sparse coarse chin hair  Psychiatric: She has a normal mood and affect. Her behavior is normal. Judgment and thought content normal.  Vitals reviewed.    Assessment/Plan: 1. Missed period -negative uhcg; will follow up with b-hcg today  -discussed precontraception health; pregnancy intention, and contraceptive  options. -she gained weight on Depo and no longer wanted to take it.  -expressed ambivalence about pregnancy, endorsing concerns over trying to repair family relationship currently.  -agreeable to repeat gc/c screen  -discussed hirsutism and acne, and if pg negative with irregular periods,would consider PCOS work-up.  -will call pt with b-hcg results; phone # verified.  -agreeable to prenatal vitamins in the context of sexually active without contraception.  - B-HCG Quant - Prenatal Vit-Fe Fumarate-FA (PRENATAL VITAMINS PLUS) 27-1 MG TABS; Take 1 tablet by mouth daily.  Dispense: 30 tablet; Refill: 11  2. Routine screening for STI (sexually transmitted infection) As per above.  - GC/chlamydia probe amp, urine  3. Elevated BP -Also elevated at last OV.  -monitor at next OV, consider labs and initiating antihypertensive   4. Pregnancy examination or test, negative result -Negative, will draw b-hcg today  -start prenatals  - POCT urine pregnancy   Follow-up:  Return if symptoms worsen or fail to improve, for GYN/Reproductive Health concerns, with any [facility] provider.   Medical decision-making:  > 15 minutes spent,  more than 50% of appointment was spent discussing diagnosis and management of symptoms

## 2015-09-19 ENCOUNTER — Telehealth: Payer: Self-pay | Admitting: *Deleted

## 2015-09-19 LAB — HCG, QUANTITATIVE, PREGNANCY

## 2015-09-19 LAB — GC/CHLAMYDIA PROBE AMP, URINE
Chlamydia, Swab/Urine, PCR: NEGATIVE
GC Probe Amp, Urine: NEGATIVE

## 2015-09-19 NOTE — Telephone Encounter (Signed)
TC to pt. Updated that labs results showed a negative beta hcg, which means no pregnancy hormone detected in blood. Asked pt to please call office if she has not received her period within the next two weeks or with any new symptoms or questions. Pt verbalized understanding, and agreeable.

## 2015-09-19 NOTE — Telephone Encounter (Signed)
-----   Message from Dierdre Harness, NP sent at 70/01/5008  9:47 AM EST ----- Negative beta hcg, which means no pregnancy hormone detected in blood. Please call office if you have not received your period within the next two weeks or with any new symptoms or questions.

## 2015-10-10 ENCOUNTER — Encounter: Payer: Self-pay | Admitting: Pediatrics

## 2015-10-11 ENCOUNTER — Telehealth: Payer: Self-pay | Admitting: Pediatrics

## 2015-10-11 DIAGNOSIS — H547 Unspecified visual loss: Secondary | ICD-10-CM

## 2015-10-11 NOTE — Telephone Encounter (Signed)
Please call patient to assist with schedule an eye exam.

## 2015-12-25 ENCOUNTER — Encounter: Payer: Self-pay | Admitting: Pediatrics

## 2016-01-14 ENCOUNTER — Encounter: Payer: Self-pay | Admitting: Family

## 2016-01-14 ENCOUNTER — Ambulatory Visit (INDEPENDENT_AMBULATORY_CARE_PROVIDER_SITE_OTHER): Payer: Medicaid Other | Admitting: Pediatrics

## 2016-01-14 ENCOUNTER — Encounter: Payer: Self-pay | Admitting: Pediatrics

## 2016-01-14 VITALS — BP 139/89 | HR 67 | Ht 61.42 in | Wt 180.8 lb

## 2016-01-14 DIAGNOSIS — Z113 Encounter for screening for infections with a predominantly sexual mode of transmission: Secondary | ICD-10-CM | POA: Diagnosis not present

## 2016-01-14 DIAGNOSIS — Z3042 Encounter for surveillance of injectable contraceptive: Secondary | ICD-10-CM | POA: Diagnosis not present

## 2016-01-14 DIAGNOSIS — L732 Hidradenitis suppurativa: Secondary | ICD-10-CM | POA: Diagnosis not present

## 2016-01-14 DIAGNOSIS — F3341 Major depressive disorder, recurrent, in partial remission: Secondary | ICD-10-CM

## 2016-01-14 DIAGNOSIS — Z3202 Encounter for pregnancy test, result negative: Secondary | ICD-10-CM | POA: Diagnosis not present

## 2016-01-14 LAB — POCT URINE PREGNANCY: Preg Test, Ur: NEGATIVE

## 2016-01-14 MED ORDER — BENZACLIN 1-5 % EX GEL: Freq: Every day | CUTANEOUS | Status: DC

## 2016-01-14 MED ORDER — MEDROXYPROGESTERONE ACETATE 150 MG/ML IM SUSP
150.0000 mg | Freq: Once | INTRAMUSCULAR | Status: AC
  Administered 2016-01-14: 150 mg via INTRAMUSCULAR

## 2016-01-14 NOTE — Progress Notes (Signed)
THIS RECORD MAY CONTAIN CONFIDENTIAL INFORMATION THAT SHOULD NOT BE RELEASED WITHOUT REVIEW OF THE SERVICE PROVIDER.  Adolescent Medicine Consultation Follow-Up Visit Deanna Valencia  is a 20 y.o. female referred by No ref. provider found here today for follow-up.    Previsit planning completed:  Yes  Pre-Visit Planning  Deanna Valencia is a 20 y.o. female referred /by No primary care provider on file..  Last seen in [facility] on 09/18/15 for missed period (negative uhcg).   Previous Psych Screenings? No  Treatment plan at last visit included r/o gc/c and pregnancy. Initiated PNV in context of sexually active without contraception.  Of note, My Chart message r/t yellow vaginal discharge on 12/25/15.  Clinical Staff Visit Tasks:  - Urine GC/CT due? Yes - UHCG  - Psych Screenings Due? No -   Provider Visit Tasks: - update sexual hx, assess menstrual cycle  - [place] Involvement? No - Pertinent Labs? No  Growth Chart Viewed? yes   History was provided by the patient.  PCP Confirmed?  no  My Chart Activated?   yes   HPI:    Patient reports she is here to get on birth control. She started school and considered that she really wanted to finish.  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 [employer].  Hasn't been sexually active for about the last month. Same partner.  No concerns today.  LMP 12/20/15. She reports this was 8 days early. It seemed heavy. Sometimes is heavy or light.  She has bumps on her pubic area from where she shaves. She has one in particular that tends to fill with pus as the hair grows out. She has a history of hidradentitis in her axillary area.  She is considering a LARC-- is concerned that a partner may be able to feel an IUD and would like to be amenorrheic which she is not sure she will get with nexplanon.    Review of Systems  Constitutional: Negative for weight loss and malaise/fatigue.  Eyes:  Negative for blurred vision.  Respiratory: Negative for shortness of breath.   Cardiovascular: Negative for chest pain and palpitations.  Gastrointestinal: Negative for nausea, vomiting, abdominal pain and constipation.  Genitourinary: Negative for dysuria.  Musculoskeletal: Negative for myalgias.  Neurological: Negative for dizziness and headaches.  Psychiatric/Behavioral: Negative for depression.     Patient's last menstrual period was 12/20/2015. Allergies  Allergen Reactions  . Amoxicillin Hives  . Tomato Itching and Rash   Outpatient Encounter Prescriptions as of 01/14/2016  Medication Sig  . [DISCONTINUED] Prenatal Vit-Fe Fumarate-FA (PRENATAL VITAMINS PLUS) 27-1 MG TABS Take 1 tablet by mouth daily.   No facility-administered encounter medications on file as of 01/14/2016.     Patient Active Problem List   Diagnosis Date Noted  . Hidradenitis suppurativa of left axilla 07/31/2015  . Borderline personality disorder 03/01/2015  . Generalized anxiety disorder 03/01/2015  . MDD (major depressive disorder), recurrent, in partial remission ([place]) 03/01/2015  . PTSD (post-traumatic stress disorder) 03/01/2015    Social History   Social History Narrative     The following portions of the patient's history were reviewed and updated as appropriate: allergies, current medications, past family history, past medical history, past social history and problem list.  Physical Exam:  Filed Vitals:   01/14/16 1118 01/14/16 1119  BP: 139/89 139/89  Pulse: 67 67  Height: 5' 1.42" (1.56 m) 5' 1.42" (1.56 m)  Weight: 180 lb 12.8 oz (82.01  kg) 180 lb 12.8 oz (82.01 kg)   BP 139/89 mmHg  Pulse 67  Ht 5' 1.42" (1.56 m)  Wt 180 lb 12.8 oz (82.01 kg)  BMI 33.70 kg/m2  LMP 12/20/2015 Body mass index: body mass index is 33.7 kg/(m^2). Facility age limit for growth percentiles is 20 years.  Physical Exam  Constitutional: She is oriented to person, place, and time. She appears  well-developed and well-nourished.  HENT:  Head: Normocephalic.  Neck: No thyromegaly present.  Cardiovascular: Normal rate, regular rhythm, normal heart sounds and intact distal pulses.   Pulmonary/Chest: Effort normal and breath sounds normal.  Abdominal: Soft. Bowel sounds are normal. There is no tenderness.  Musculoskeletal: Normal range of motion.  Neurological: She is alert and oriented to person, place, and time.  Skin: Skin is warm and dry.  Psychiatric: She has a normal mood and affect.     Assessment/Plan: 1. Encounter for Depo-Provera contraception Depo today. Provided brochures for nexplanon and IUD. Discussed concerns of weight gain in the future. She is about 12 pounds down from peak weight on depo. She notes that her schedule is different now and she feels that she will not be doing as much mindless eating.  - medroxyPROGESTERone (DEPO-PROVERA) injection 150 mg; Inject 1 mL (150 mg total) into the muscle once.  2. MDD (major depressive disorder), recurrent, in partial remission ([place]) Doing well right now. In school and appears stable. Boyfriend is in jail but gets out this week- she reports this is not stressful.   3. Routine screening for STI (sexually transmitted infection) No change in partner but given history will repeat.  - GC/Chlamydia Probe Amp  4. Pregnancy examination or test, negative result Negative today.  - POCT urine pregnancy  5. Hidradenitis suppurativa Evidence of hidradentitis where she has shaved. Discussed not shaving, however, she feels like she can't stop this. Will try some benzaclin cream on affected area to see if it will clear. Discussed concerning signs to return sooner.  - BENZACLIN gel; Apply topically daily. NAME BRAND ONLY FOR MEDICAID  Dispense: 25 g; Refill: 0   Follow-up:  3 months   Medical decision-making:  > 25 minutes spent, more than 50% of appointment was spent discussing diagnosis and management of symptoms

## 2016-01-14 NOTE — Progress Notes (Signed)
Pre-Visit Planning  Deanna Valencia  is a 20 y.o. female referred /by No primary care provider on file..   Last seen in [facility] on 09/18/15 for missed period (negative uhcg).   Previous Psych Screenings? No  Treatment plan at last visit included r/o gc/c and pregnancy. Initiated PNV in context of sexually active without contraception.  Of note, My Chart message r/t yellow vaginal discharge on 12/25/15.  Clinical Staff Visit Tasks:   - Urine GC/CT due? Yes - UHCG  - Psych Screenings Due? No -   Provider Visit Tasks: - update sexual hx, assess menstrual cycle  - [place] Involvement? No - Pertinent Labs? No

## 2016-01-14 NOTE — Patient Instructions (Signed)

## 2016-01-15 ENCOUNTER — Telehealth: Payer: Self-pay | Admitting: *Deleted

## 2016-01-15 LAB — GC/CHLAMYDIA PROBE AMP
CT Probe RNA: NOT DETECTED
GC Probe RNA: NOT DETECTED

## 2016-01-15 NOTE — Telephone Encounter (Signed)
LVM w/ pt that all labs WNL. Provided phone number for callback if needed.

## 2016-01-15 NOTE — Telephone Encounter (Signed)
-----   Message from Trude Mcburney, Catharine sent at 01/15/2016  3:26 PM EST ----- Gc/chlamydia results negative. Will continue to screen yearly and as needed.

## 2016-01-28 ENCOUNTER — Encounter: Payer: Self-pay | Admitting: Obstetrics & Gynecology

## 2016-02-01 ENCOUNTER — Encounter: Payer: Self-pay | Admitting: Pediatrics

## 2016-03-31 ENCOUNTER — Encounter: Payer: Self-pay | Admitting: Pediatrics

## 2016-03-31 ENCOUNTER — Ambulatory Visit (INDEPENDENT_AMBULATORY_CARE_PROVIDER_SITE_OTHER): Payer: Medicaid Other | Admitting: Pediatrics

## 2016-03-31 VITALS — BP 133/85 | HR 87 | Ht 60.63 in | Wt 190.0 lb

## 2016-03-31 DIAGNOSIS — L732 Hidradenitis suppurativa: Secondary | ICD-10-CM

## 2016-03-31 DIAGNOSIS — M79662 Pain in left lower leg: Secondary | ICD-10-CM | POA: Diagnosis not present

## 2016-03-31 DIAGNOSIS — Z3049 Encounter for surveillance of other contraceptives: Secondary | ICD-10-CM

## 2016-03-31 DIAGNOSIS — F411 Generalized anxiety disorder: Secondary | ICD-10-CM | POA: Diagnosis not present

## 2016-03-31 DIAGNOSIS — M545 Low back pain, unspecified: Secondary | ICD-10-CM

## 2016-03-31 DIAGNOSIS — Z30017 Encounter for initial prescription of implantable subdermal contraceptive: Secondary | ICD-10-CM

## 2016-03-31 DIAGNOSIS — Z113 Encounter for screening for infections with a predominantly sexual mode of transmission: Secondary | ICD-10-CM | POA: Diagnosis not present

## 2016-03-31 DIAGNOSIS — Z3046 Encounter for surveillance of implantable subdermal contraceptive: Secondary | ICD-10-CM | POA: Insufficient documentation

## 2016-03-31 MED ORDER — ETONOGESTREL 68 MG ~~LOC~~ IMPL
68.0000 mg | DRUG_IMPLANT | Freq: Once | SUBCUTANEOUS | Status: AC
Start: 2016-03-31 — End: 2016-03-31
  Administered 2016-03-31: 68 mg via SUBCUTANEOUS

## 2016-03-31 NOTE — Progress Notes (Signed)
Nexplanon Insertion  No contraindications for placement.  No liver disease, no unexplained vaginal bleeding, no h/o breast cancer, no h/o blood clots.  No LMP recorded (lmp unknown).  UHCG: neg- in depo window   Last Unprotected sex:  None- on depo   Risks & benefits of Nexplanon discussed The nexplanon device was purchased and supplied by [facility]. Packaging instructions supplied to patient Consent form signed  The patient denies any allergies to anesthetics or antiseptics.  Procedure: Pt was placed in supine position. The right arm was flexed at the elbow and externally rotated so that her wrist was parallel to her ear The medial epicondyle of the right arm was identified The insertions site was marked 8 cm proximal to the medial epicondyle The insertion site was cleaned with Betadine The area surrounding the insertion site was covered with a sterile drape 1% lidocaine was injected just under the skin at the insertion site extending 4 cm proximally. The sterile preloaded disposable Nexaplanon applicator was removed from the sterile packaging The applicator needle was inserted at a 30 degree angle at 8 cm proximal to the medial epicondyle as marked The applicator was lowered to a horizontal position and advanced just under the skin for the full length of the needle The slider on the applicator was retracted fully while the applicator remained in the same position, then the applicator was removed. The implant was confirmed via palpation as being in position The implant position was demonstrated to the patient Pressure dressing was applied to the patient.  The patient was instructed to removed the pressure dressing in 24 hrs.  The patient was advised to move slowly from a supine to an upright position  The patient denied any concerns or complaints  The patient was instructed to schedule a follow-up appt in 1 month and to call sooner if any concerns.  The patient acknowledged  agreement and understanding of the plan.

## 2016-03-31 NOTE — Progress Notes (Signed)
Pre-Visit Planning  Deanna Valencia  is a 20 y.o. female referred by No primary care provider on file..   Last seen in [facility] on 01/14/16 for depo and benzaclin for hidradenitis.   Plan at last visit included depo.  Date and Type of Previous Psych Screenings? Yes  PHQ-SADS 07/31/2015  PHQ-15 14  GAD-7 5  PHQ-9 10  Suicidal Ideation No  Comment very difficult    Clinical Staff Visit Tasks:   - Urine GC/CT due? no - HIV Screening due?  Yes- patient had emailed and asked about being tested- can do today  - Psych Screenings Due? Yes - PHQ-SADS - depo shot if patient desires- had also considered LARC  Provider Visit Tasks: - discuss birth control options  - recheck hidradenitis  - discuss concerns for STI  - [facility] Involvement? No - Pertinent Labs? No  >5 minutes spent reviewing records and planning for patient's visit.

## 2016-03-31 NOTE — Progress Notes (Addendum)
THIS RECORD MAY CONTAIN CONFIDENTIAL INFORMATION THAT SHOULD NOT BE RELEASED WITHOUT REVIEW OF THE SERVICE PROVIDER.  Adolescent Medicine Consultation Follow-Up Visit Deanna Valencia  is a 20 y.o. female referred by No ref. provider found here today for follow-up.    Previsit planning completed:  Yes  Pre-Visit Planning  Deanna Valencia is a 20 y.o. female referred by No primary care provider on file..  Last seen in [facility] on 01/14/16 for depo and benzaclin for hidradenitis.   Plan at last visit included depo.  Date and Type of Previous Psych Screenings? Yes  PHQ-SADS 07/31/2015  PHQ-15 14  GAD-7 5  PHQ-9 10  Suicidal Ideation No  Comment very difficult    Clinical Staff Visit Tasks:  - Urine GC/CT due? no - HIV Screening due? Yes- patient had emailed and asked about being tested- can do today  - Psych Screenings Due? Yes - PHQ-SADS - depo shot if patient desires- had also considered LARC  Provider Visit Tasks: - discuss birth control options  - recheck hidradenitis  - discuss concerns for STI  - [facility] Involvement? No - Pertinent Labs? No  >5 minutes spent reviewing records and planning for patient's visit.          Growth Chart Viewed? yes   History was provided by the patient.  PCP Confirmed?  No- gave phone number for [facility]  My Chart Activated?   yes   HPI:    Thought it would be different with depo this time but has been eating all the time with the hunger. She has reviewed the nexplanon and IUD brochures and would like to proceed with a nexplanon today.  Same partner as before. No condoms. She would like to be re-tested for HIV today- she is not concerned for infection but always likes to be careful.  No history of migraines, early breast or ovarian cancer. Has tolerated depo well with no side effects except weight gain. This is her biggest concern with nexplanon. Understands about unpredictable bleeding and  treatment options.  Has a bony prominence on left shin that is painful with palpated but not with ambulation. Has grown a little bit since she accidentally hit it on something. Also having some mild lower back pain.    PHQ-SADS 03/31/2016  PHQ-15 5  GAD-7 10  PHQ-9 5  Suicidal Ideation No  Comment Somewhat difficult     Review of Systems  Constitutional: Negative for weight loss and malaise/fatigue.  Eyes: Negative for blurred vision.  Respiratory: Negative for shortness of breath.   Cardiovascular: Negative for chest pain and palpitations.  Gastrointestinal: Negative for nausea, vomiting, abdominal pain and constipation.  Genitourinary: Negative for dysuria.  Musculoskeletal: Positive for back pain. Negative for myalgias.       Bony prominence on left shin that is painful when palpated but not with ambulation  Neurological: Negative for dizziness and headaches.  Psychiatric/Behavioral: Negative for depression.     No LMP recorded (lmp unknown). Allergies  Allergen Reactions  . Amoxicillin Hives  . Tomato Itching and Rash   Outpatient Prescriptions Prior to Visit  Medication Sig Dispense Refill  . BENZACLIN gel Apply topically daily. NAME BRAND ONLY FOR MEDICAID 25 g 0   No facility-administered medications prior to visit.     Patient Active Problem List   Diagnosis Date Noted  . Hidradenitis suppurativa of left axilla 07/31/2015  . Borderline personality disorder 03/01/2015  . Generalized anxiety disorder 03/01/2015  . MDD (major depressive disorder),  recurrent, in partial remission ([place]) 03/01/2015  . PTSD (post-traumatic stress disorder) 03/01/2015     The following portions of the patient's history were reviewed and updated as appropriate: allergies, current medications, past family history, past medical history, past social history and problem list.  Physical Exam:  Filed Vitals:   03/31/16 1356  BP: 133/85  Pulse: 87  Height: 5' 0.63" (1.54 m)  Weight: 190  lb (86.183 kg)   BP 133/85 mmHg  Pulse 87  Ht 5' 0.63" (1.54 m)  Wt 190 lb (86.183 kg)  BMI 36.34 kg/m2  LMP  (LMP Unknown) Body mass index: body mass index is 36.34 kg/(m^2). Facility age limit for growth percentiles is 20 years.  Physical Exam  Constitutional: She is oriented to person, place, and time. She appears well-developed and well-nourished.  HENT:  Head: Normocephalic.  Neck: No thyromegaly present.  Cardiovascular: Normal rate, regular rhythm, normal heart sounds and intact distal pulses.   Pulmonary/Chest: Effort normal and breath sounds normal.  Abdominal: Soft. Bowel sounds are normal. There is no tenderness.  Musculoskeletal: Normal range of motion. She exhibits tenderness.       Lumbar back: She exhibits tenderness.  Bony prominence on left shin that is painful when palpated but not with ambulation  Neurological: She is alert and oriented to person, place, and time.  Skin: Skin is warm and dry.  Psychiatric: She has a normal mood and affect.    Assessment/Plan: 1. Insertion of Nexplanon See procedure note. Tolerated well.  - etonogestrel (NEXPLANON) implant 68 mg; 68 mg by Subdermal route once. - Subdermal Etonogestrel Implant Insertion; Standing - Subdermal Etonogestrel Implant Insertion  2. Generalized anxiety disorder Doing well currently off medications. PHQ-SADs still mildly elevated. Will continue to monitor.   3. Hidradenitis suppurativa of left axilla Is careful with shaving and keeping area clean. No signs of infection. Can keep using benzaclin cream as needed.   4. Midline low back pain without sciatica Provided exercises today for her to do at home.   5. Routine screening for STI (sexually transmitted infection) Per patient request.  - GC/Chlamydia Probe Amp - HIV antibody  6. Pain in shin  Will continue to monitor. Can have some calcification of any injury at that site- if it continues to grow, becomes painful with ambulation, etc will  send for imaging.   Provided with [facility] phone number for scheduling of new patient visit and a Medicaid dentist list.    Follow-up:  1 month   Medical decision-making:  > 40 minutes spent, more than 50% of appointment was spent discussing diagnosis and management of symptoms

## 2016-03-31 NOTE — Patient Instructions (Signed)
[facility] - make appointment ASAP and they will take care of everything you need!  [phone]  Take a look at the dentist list I gave you and make an appointment   Follow-up with Dr. Henrene Pastor in 1 month. Schedule this appointment before you leave clinic today.  Congratulations on getting your Nexplanon placement!  Below is some important information about Nexplanon.  First remember that Nexplanon does not prevent sexually transmitted infections.  Condoms will help prevent sexually transmitted infections. The Nexplanon starts working 7 days after it was inserted.  There is a risk of getting pregnant if you have unprotected sex in those first 7 days after placement of the Nexplanon.  The Nexplanon lasts for 3 years but can be removed at any time.  You can become pregnant as early as 1 week after removal.  You can have a new Nexplanon put in after the old one is removed if you like.  It is not known whether Nexplanon is as effective in women who are very overweight because the studies did not include many overweight women.  Nexplanon interacts with some medications, including barbiturates, bosentan, carbamazepine, felbamate, griseofulvin, oxcarbazepine, phenytoin, rifampin, St. John's wort, topiramate, HIV medicines.  Please alert your doctor if you are on any of these medicines.  Always tell other healthcare providers that you have a Nexplanon in your arm.  The Nexplanon was placed just under the skin.  Leave the outside bandage on for 24 hours.  Leave the smaller bandage on for 3-5 days or until it falls off on its own.  Keep the area clean and dry for 3-5 days. There is usually bruising or swelling at the insertion site for a few days to a week after placement.  If you see redness or pus draining from the insertion site, call us immediately.  Keep your user card with the date the implant was placed and the date the implant is to be removed.  The most common side effect is  a change in your menstrual bleeding pattern.   This bleeding is generally not harmful to you but can be annoying.  Call or come in to see [place] if you have any concerns about the bleeding or if you have any side effects or questions.    We will call you in 1 week to check in and we would like you to return to the clinic for a follow-up visit in 1 month.  You can call [facility] 24 hours a day with any questions or concerns.  There is always a nurse or doctor available to take your call.  Call 9-1-1 if you have a life-threatening emergency.  For anything else, please call us at [phone] before heading to the ER.

## 2016-03-31 NOTE — Procedures (Signed)
Nexplanon Insertion  No contraindications for placement.  No liver disease, no unexplained vaginal bleeding, no h/o breast cancer, no h/o blood clots.  No LMP recorded (lmp unknown).  UHCG: neg- in depo window   Last Unprotected sex:  None- current depo   Risks & benefits of Nexplanon discussed The nexplanon device was purchased and supplied by [facility]. Packaging instructions supplied to patient Consent form signed  The patient denies any allergies to anesthetics or antiseptics.  Procedure: Pt was placed in supine position. The right arm was flexed at the elbow and externally rotated so that her wrist was parallel to her ear The medial epicondyle of the right arm was identified The insertions site was marked 8 cm proximal to the medial epicondyle The insertion site was cleaned with Betadine The area surrounding the insertion site was covered with a sterile drape 1% lidocaine was injected just under the skin at the insertion site extending 4 cm proximally. The sterile preloaded disposable Nexaplanon applicator was removed from the sterile packaging The applicator needle was inserted at a 30 degree angle at 8 cm proximal to the medial epicondyle as marked The applicator was lowered to a horizontal position and advanced just under the skin for the full length of the needle The slider on the applicator was retracted fully while the applicator remained in the same position, then the applicator was removed. The implant was confirmed via palpation as being in position The implant position was demonstrated to the patient Pressure dressing was applied to the patient.  The patient was instructed to removed the pressure dressing in 24 hrs.  The patient was advised to move slowly from a supine to an upright position  The patient denied any concerns or complaints  The patient was instructed to schedule a follow-up appt in 1 month and to call sooner if any concerns.  The patient acknowledged  agreement and understanding of the plan.

## 2016-04-01 LAB — GC/CHLAMYDIA PROBE AMP
CT Probe RNA: NOT DETECTED
GC Probe RNA: NOT DETECTED

## 2016-04-01 LAB — HIV ANTIBODY (ROUTINE TESTING W REFLEX): HIV: NONREACTIVE

## 2016-04-04 ENCOUNTER — Ambulatory Visit: Payer: Self-pay | Admitting: *Deleted

## 2016-04-29 ENCOUNTER — Encounter: Payer: Self-pay | Admitting: Family

## 2016-04-29 ENCOUNTER — Ambulatory Visit (INDEPENDENT_AMBULATORY_CARE_PROVIDER_SITE_OTHER): Payer: Medicaid Other | Admitting: Family

## 2016-04-29 VITALS — Ht 61.0 in | Wt 191.2 lb

## 2016-04-29 DIAGNOSIS — D500 Iron deficiency anemia secondary to blood loss (chronic): Secondary | ICD-10-CM

## 2016-04-29 DIAGNOSIS — N921 Excessive and frequent menstruation with irregular cycle: Secondary | ICD-10-CM | POA: Diagnosis not present

## 2016-04-29 DIAGNOSIS — Z113 Encounter for screening for infections with a predominantly sexual mode of transmission: Secondary | ICD-10-CM | POA: Diagnosis not present

## 2016-04-29 DIAGNOSIS — Z975 Presence of (intrauterine) contraceptive device: Secondary | ICD-10-CM | POA: Diagnosis not present

## 2016-04-29 DIAGNOSIS — Z130 Encounter for screening for diseases of the blood and blood-forming organs and certain disorders involving the immune mechanism: Secondary | ICD-10-CM

## 2016-04-29 LAB — POCT HEMOGLOBIN: Hemoglobin: 11.4 g/dL — AB (ref 12.2–16.2)

## 2016-04-29 MED ORDER — NORETHIN ACE-ETH ESTRAD-FE 1-20 MG-MCG PO TABS: 1.0000 | ORAL_TABLET | Freq: Every day | ORAL | Status: DC

## 2016-04-29 NOTE — Progress Notes (Signed)
THIS RECORD MAY CONTAIN CONFIDENTIAL INFORMATION THAT SHOULD NOT BE RELEASED WITHOUT REVIEW OF THE SERVICE PROVIDER.  Adolescent Medicine Consultation Follow-Up Visit Deanna Valencia  is a 20 y.o. female referred by No ref. provider found here today for follow-up.    Previsit planning completed:  no  Growth Chart Viewed? no   History was provided by the patient.  PCP Confirmed?  None  My Chart Activated?   yes   HPI:    20 yo presents s/p 03/31/16 bleeding about every 2 weeks since insertion.  Desires STI screening today.  -Cramping when spotting/bleeding. Bleeding light, uses tissue.  -HAs - 3xweek, nauseous, with nexplanon.   -no PMH or FH of migraine with aura, early stroke, CA, DVT or PE.  -smoking black & tans x 3 daily; not ready to quit.   No LMP recorded (lmp unknown). Allergies  Allergen Reactions  . Amoxicillin Hives  . Tomato Itching and Rash   Outpatient Prescriptions Prior to Visit  Medication Sig Dispense Refill  . BENZACLIN gel Apply topically daily. NAME BRAND ONLY FOR MEDICAID 25 g 0   No facility-administered medications prior to visit.     Patient Active Problem List   Diagnosis Date Noted  . Pain in left shin 03/31/2016  . Surveillance of implantable subdermal contraceptive 03/31/2016  . Hidradenitis suppurativa of left axilla 07/31/2015  . Borderline personality disorder 03/01/2015  . Generalized anxiety disorder 03/01/2015  . MDD (major depressive disorder), recurrent, in partial remission ([place]) 03/01/2015  . PTSD (post-traumatic stress disorder) 03/01/2015   Confidentiality was discussed with the patient and if applicable, with caregiver as well.  The following portions of the patient's history were reviewed and updated as appropriate: allergies, current medications, past family history, past medical history, past social history, past surgical history and problem list.   PHQ-SADS 05/01/2016 03/31/2016 07/31/2015  PHQ-15 6 5 14   GAD-7 6 10 5    PHQ-9 7 5 10   Suicidal Ideation No No No  Comment  Somewhat difficult  very difficult    Physical Exam:  Filed Vitals:   04/29/16 1636  Height: 5\' 1"  (1.549 m)  Weight: 191 lb 3.2 oz (86.728 kg)  BP 130/80  Ht 5\' 1"  (1.549 m)  Wt 191 lb 3.2 oz (86.728 kg)  BMI 36.15 kg/m2  LMP 04/15/2016 Body mass index: body mass index is 36.15 kg/(m^2). Facility age limit for growth percentiles is 20 years.  BP Readings from Last 3 Encounters:  03/31/16 133/85  01/14/16 139/89  09/18/15 140/85   Physical Exam  Constitutional: She is oriented to person, place, and time. She appears well-developed and well-nourished.  HENT:  Head: Normocephalic.  Neck: No thyromegaly present.  Cardiovascular: Normal rate, regular rhythm, normal heart sounds and intact distal pulses.   Pulmonary/Chest: Effort normal and breath sounds normal.  Abdominal: Soft. Bowel sounds are normal. There is no tenderness.  Musculoskeletal: Normal range of motion.  Neurological: She is alert and oriented to person, place, and time.  Skin: Skin is warm and dry.  Psychiatric: She has a normal mood and affect.      Assessment/Plan: 1. Breakthrough bleeding on Nexplanon -will try OCPs; reviewed BP and smoking concerns. -limit OCP use to 2-3 months; watch BP -encouraged smoking cessation; will monitor change readiness  2. Routine screening for STI (sexually transmitted infection) -per patient request, asymptomatic  - RPR - HIV antibody (with reflex) - GC/Chlamydia Probe Amp  3. Screening for iron deficiency anemia -11.4; reviewed and will send in iron  supplement - POCT hemoglobin - GC/Chlamydia Probe Amp  4. Iron deficiency anemia due to chronic blood loss Per above  - ferrous sulfate 325 (65 FE) MG tablet; Take 1 tablet (325 mg total) by mouth daily with breakfast.  Dispense: 30 tablet; Refill: 3    Follow-up:  Return in about 6 weeks (around 06/10/2016).   Medical decision-making:  > 25 minutes spent, more  than 50% of appointment was spent discussing diagnosis and management of symptoms

## 2016-04-30 LAB — GC/CHLAMYDIA PROBE AMP
CT Probe RNA: NOT DETECTED
GC Probe RNA: NOT DETECTED

## 2016-04-30 LAB — HIV ANTIBODY (ROUTINE TESTING W REFLEX): HIV 1&2 Ab, 4th Generation: NONREACTIVE

## 2016-04-30 LAB — RPR

## 2016-04-30 MED ORDER — FERROUS SULFATE 325 (65 FE) MG PO TABS
325.0000 mg | ORAL_TABLET | Freq: Every day | ORAL | Status: DC
Start: 2016-04-30 — End: 2016-07-22

## 2016-05-01 ENCOUNTER — Encounter: Payer: Self-pay | Admitting: *Deleted

## 2016-06-05 ENCOUNTER — Encounter: Payer: Self-pay | Admitting: Pediatrics

## 2016-06-10 ENCOUNTER — Ambulatory Visit: Payer: Medicaid Other | Admitting: Family

## 2016-06-11 ENCOUNTER — Ambulatory Visit: Payer: Medicaid Other | Admitting: Family

## 2016-07-22 ENCOUNTER — Ambulatory Visit (INDEPENDENT_AMBULATORY_CARE_PROVIDER_SITE_OTHER): Payer: Medicaid Other | Admitting: Pediatrics

## 2016-07-22 ENCOUNTER — Encounter: Payer: Self-pay | Admitting: Family

## 2016-07-22 VITALS — BP 140/99 | HR 85 | Ht 61.0 in | Wt 194.0 lb

## 2016-07-22 DIAGNOSIS — Z3046 Encounter for surveillance of implantable subdermal contraceptive: Secondary | ICD-10-CM

## 2016-07-22 DIAGNOSIS — F603 Borderline personality disorder: Secondary | ICD-10-CM | POA: Diagnosis not present

## 2016-07-22 DIAGNOSIS — Z130 Encounter for screening for diseases of the blood and blood-forming organs and certain disorders involving the immune mechanism: Secondary | ICD-10-CM

## 2016-07-22 DIAGNOSIS — Z113 Encounter for screening for infections with a predominantly sexual mode of transmission: Secondary | ICD-10-CM

## 2016-07-22 LAB — POCT HEMOGLOBIN: Hemoglobin: 12.3 g/dL (ref 12.2–16.2)

## 2016-07-22 MED ORDER — CARBAMIDE PEROXIDE 6.5 % OT SOLN
5.0000 [drp] | Freq: Once | OTIC | Status: AC
  Administered 2016-07-22: 5 [drp] via OTIC

## 2016-07-22 MED ORDER — PRENATAL VITAMIN 27-0.8 MG PO TABS: 1.0000 | ORAL_TABLET | Freq: Every day | ORAL | 3 refills | Status: DC

## 2016-07-22 NOTE — Progress Notes (Signed)
THIS RECORD MAY CONTAIN CONFIDENTIAL INFORMATION THAT SHOULD NOT BE RELEASED WITHOUT REVIEW OF THE SERVICE PROVIDER.  Adolescent Medicine Consultation Follow-Up Visit Deanna Valencia  is a 20 y.o. female referred by No ref. provider found here today for follow-up regarding nexplanon follow up..    [id number] Follow Up  HPI:   Deanna Valencia is a [place] female presenting today for Nexplanon follow up.  Previously on Depo; discontinued due to increased appetite. Nexplanon inserted on 03/31/16.   Last visit on 04/29/16 with breakthrough bleeding while on Nexplanon. OCPs prescribed with plan to use for 2-76months.   Last screened for STDs 04/29/16. Negative for Gonorrhea, Chlamydia, HIV, RPR.  Would like Nexplanon removed. Feels like she's been more easily irritated, headaches. Currently in right arm. Occasional spotting, however improved from last office visit.. Did not take OCPs as prescribed at last office visit.   Does not want to any other birth control. Not interested in Depo shots given increased appetite in past. Not interested in IUD. States she would not remember to take birth control.  Indifferent about possible pregnancy. States she has good support system with her boyfriend and her dad. Has plans for child care.  Reports she is "Sorta" sexually active--last sexually active day before yesterday. No protection.  Smoking black and milds--shares with boyfriend, ~1 per day.  In school at [employer] [place] in psychology  Also of note, reports difficulty hearing out of right ear for 3-4 days  Patient's last menstrual period was 07/08/2016 (approximate). Allergies  Allergen Reactions  . Amoxicillin Hives  . Tomato Itching and Rash   Outpatient Medications Prior to Visit  Medication Sig Dispense Refill  . BENZACLIN gel Apply topically daily. NAME BRAND ONLY FOR MEDICAID 25 g 0  . hydrOXYzine (VISTARIL) 25 MG capsule Take 25 mg by mouth 3 (three) times daily as needed.    Marland Kitchen QUEtiapine  (SEROQUEL) 100 MG tablet Take 100 mg by mouth at bedtime.    . ferrous sulfate 325 (65 FE) MG tablet Take 1 tablet (325 mg total) by mouth daily with breakfast. 30 tablet 3  . norethindrone-ethinyl estradiol (JUNEL FE 1/20) 1-20 MG-MCG tablet Take 1 tablet by mouth daily. 1 Package 1   No facility-administered medications prior to visit.      Patient Active Problem List   Diagnosis Date Noted  . Pain in left shin 03/31/2016  . Surveillance of implantable subdermal contraceptive 03/31/2016  . Hidradenitis suppurativa of left axilla 07/31/2015  . Borderline personality disorder 03/01/2015  . Generalized anxiety disorder 03/01/2015  . MDD (major depressive disorder), recurrent, in partial remission ([place]) 03/01/2015  . PTSD (post-traumatic stress disorder) 03/01/2015   Physical Exam:  Vitals:   07/22/16 1503  BP: (!) 140/99  Pulse: 85  Weight: 194 lb (88 kg)  Height: 5\' 1"  (1.549 m)   BP (!) 140/99   Pulse 85   Ht 5\' 1"  (1.549 m)   Wt 194 lb (88 kg)   LMP 07/08/2016 (Approximate)   BMI 36.66 kg/m  Body mass index: body mass index is 36.66 kg/m. Growth percentile SmartLinks can only be used for patients less than 60 years old.   Physical Exam  Constitutional: She appears well-developed and well-nourished. No distress.  HENT:  Head: Normocephalic.  Left tympanic membrane normal. Right tympanic membrane with some cerumen.  Cardiovascular: Normal rate and regular rhythm.   No murmur heard. Pulmonary/Chest: Effort normal. No respiratory distress. She has no wheezes.  Abdominal: Soft. She exhibits no distension. There is no  tenderness.  Psychiatric: She has a normal mood and affect.    Assessment/Plan: Surveillance of implantable subdermal contraceptive - Would like removed. To schedule appointment for removal of Nexplanon. - Prenatal vitamins prescribed. - Medication list reviewed. Seroquel pregnancy class C. - Cerumen cleaned from right ear.   Follow-up:  No Follow-up  on file.   Medical decision-making:  >30 minutes spent face to face with patient with more than 50% of appointment spent discussing diagnosis, management, follow-up, and reviewing the plan of care as noted above.

## 2016-07-22 NOTE — Assessment & Plan Note (Signed)
-   Would like removed. To schedule appointment for removal of Nexplanon. - Prenatal vitamins prescribed. - Medication list reviewed. Seroquel pregnancy class C.

## 2016-07-22 NOTE — Patient Instructions (Signed)
Thank you so much for coming to visit today! Please schedule an appointment to have your nexplanon removed in 2 weeks. Prenatal vitamins have been sent to the pharmacy. Please take these every day. Please consider other forms of birth control. Once the Nexplanon is out, you CAN get pregnant.  Dr. Gerlean Ren

## 2016-07-23 ENCOUNTER — Telehealth: Payer: Self-pay | Admitting: *Deleted

## 2016-07-23 LAB — GC/CHLAMYDIA PROBE AMP
CT Probe RNA: NOT DETECTED
GC Probe RNA: NOT DETECTED

## 2016-07-23 NOTE — Telephone Encounter (Signed)
Substitute is fine. Thanks.

## 2016-07-23 NOTE — Telephone Encounter (Signed)
VM from [facility]. States that they received rx for prenatal vitamins, iron, and folic acid. They do not carry the brand prescribed: 27-0.8mg . They do have   27-1mg . Pharmacy would like to know if they can use substitute.   Callback: [phone]  Will route to provider.

## 2016-07-23 NOTE — Telephone Encounter (Signed)
LVM w/ pharmacist to substitute prenatal vitamin. Clinic phone number provided.

## 2016-08-07 ENCOUNTER — Ambulatory Visit: Payer: Medicaid Other | Admitting: Pediatrics

## 2016-11-11 ENCOUNTER — Ambulatory Visit (INDEPENDENT_AMBULATORY_CARE_PROVIDER_SITE_OTHER): Payer: Medicaid Other | Admitting: Pediatrics

## 2016-11-11 VITALS — Temp 98.7°F | Wt 192.6 lb

## 2016-11-11 DIAGNOSIS — L732 Hidradenitis suppurativa: Secondary | ICD-10-CM

## 2016-11-11 MED ORDER — CLINDAMYCIN HCL 300 MG PO CAPS: 300.0000 mg | ORAL_CAPSULE | Freq: Four times a day (QID) | ORAL | 0 refills | Status: AC

## 2016-11-11 NOTE — Progress Notes (Addendum)
History was provided by the patient.   HPI:  Deanna Valencia is a 21 y.o. Female with a history of hidraenitis, GAD, and PDD presenting with a few month history of bilateral lumps in her axillae. Notes she had a similar episode a few years ago in her L axillae that required drainage in the ED. She was given topical treatment at that time (clinda and benzyl peroxide) with improvement in her symptoms. A surgery referral was made, but she never followed-up.  She noticed axillary swelling, bilaterally over the last few months. Now becoming more painful and bringing her in. She has tried no therapies.   Denies recent weight loss. Questionable night sweats. Family history of cancers, but unknown which types. Denies fever. No cat in the home.  Endorses inguinal lymphadenopathy at times, but not currently.  Patient Active Problem List   Diagnosis Date Noted  . Hidradenitis axillaris 11/11/2016  . Pain in left shin 03/31/2016  . Surveillance of implantable subdermal contraceptive 03/31/2016  . Borderline personality disorder 03/01/2015  . Generalized anxiety disorder 03/01/2015  . MDD (major depressive disorder), recurrent, in partial remission ([place]) 03/01/2015  . PTSD (post-traumatic stress disorder) 03/01/2015    Current Outpatient Prescriptions on File Prior to Visit  Medication Sig Dispense Refill  . BENZACLIN gel Apply topically daily. NAME BRAND ONLY FOR MEDICAID (Patient not taking: Reported on 11/11/2016) 25 g 0  . hydrOXYzine (VISTARIL) 25 MG capsule Take 25 mg by mouth 3 (three) times daily as needed.    . Prenatal Vit-Fe Fumarate-FA (PRENATAL VITAMIN) 27-0.8 MG TABS Take 1 tablet by mouth daily. (Patient not taking: Reported on 11/11/2016) 90 tablet 3  . QUEtiapine (SEROQUEL) 100 MG tablet Take 100 mg by mouth at bedtime.     No current facility-administered medications on file prior to visit.     Physical Exam:  Temp 98.7 F (37.1 C)   Wt 87.4 kg (192 lb 9.6 oz)   LMP 11/10/2016    BMI 36.39 kg/m   Growth percentile SmartLinks can only be used for patients less than 82 years old. Patient's last menstrual period was 11/10/2016.    General:   alert, cooperative and no distress     Skin:   normal  Oral cavity:   lips, mucosa, and tongue normal; teeth and gums normal  Eyes:   sclerae white  Neck:  No cervical or supraclavicular lymphadenopathy.  Lungs:  clear to auscultation bilaterally  Heart:   regular rate and rhythm, S1, S2 normal, no murmur, click, rub or gallop   Abdomen:  normal findings: soft, non-tender  Extremities:   ~3 1.5-2 cm mobile and rubbery lymphnodes in R axillae, tender to touch. ~2 1.5-2 cm mobile and rubbery lymphnodes in L axillae, tender to touch.  Neuro:  normal without focal findings and mental status, speech normal, alert and oriented x3    Assessment/Plan: Deanna Valencia is a 21 y.o. Female with a history of hidraenitis, GAD, and PDD presenting with a few month history of bilateral lumps in her axillae most consistent with hidradenitis.   Hidradenitis, recurrent: Based on patient's exam, history of hidradenitis suppurativa and improvement with topical antibiotic therapy suspect infectious etiology - Clindamycin 300 mg QID for 10 days - Warm compresses - Quant gold pending - Defer further hematological work-up, as seems consistent with infection as noted above. May consider further work up if symptoms persist or associated with other B symptoms. -Quant gold to r/o TB given h/o night sweats.  - Gen  Surgery referral to establish care as has been refractory in the past (she had been referred in a previous visit but never went)  Health maintenance:  - Referral made to [facility] for establishing care  - Follow-up visit in 10-14 days for re-check s/p antibiotic therapy  Bland Span, MD Internal Medicine-Pediatrics, PGY-1  I saw and evaluated the patient, performing the key elements of the service. I developed the management plan  that is described in the resident's note, and I agree with the content.    [facility]                  11/12/2016, 5:18 PM

## 2016-11-11 NOTE — Patient Instructions (Signed)
You were seen today for recurrent hydradenitis, which is an infection of the glands in the skin that can be difficult to treat.  For this, please take clindamycin 300 mg four times daily for 10 days. If you develop fever or worsening pain, please return to a medical provider.   It will be helpful for you to follow-up with surgery, as this condition can sometimes be refractory to treatment. Therefore, if your symptoms do not improve on this antibiotic please make an appointment with surgery.  We have also made a referral for you to establish care for an adult provider at [place].

## 2016-11-13 LAB — QUANTIFERON TB GOLD ASSAY (BLOOD)
INTERFERON GAMMA RELEASE ASSAY: NEGATIVE
Mitogen-Nil: 9.27 IU/mL
QUANTIFERON NIL VALUE: 0.02 [IU]/mL
Quantiferon Tb Ag Minus Nil Value: 0 IU/mL

## 2016-11-24 ENCOUNTER — Ambulatory Visit (INDEPENDENT_AMBULATORY_CARE_PROVIDER_SITE_OTHER): Payer: Medicaid Other | Admitting: Pediatrics

## 2016-11-24 ENCOUNTER — Encounter: Payer: Self-pay | Admitting: Pediatrics

## 2016-11-24 ENCOUNTER — Ambulatory Visit: Payer: Medicaid Other

## 2016-11-24 VITALS — Temp 99.8°F | Wt 193.0 lb

## 2016-11-24 DIAGNOSIS — L0291 Cutaneous abscess, unspecified: Secondary | ICD-10-CM | POA: Diagnosis not present

## 2016-11-24 DIAGNOSIS — L732 Hidradenitis suppurativa: Secondary | ICD-10-CM | POA: Diagnosis not present

## 2016-11-24 MED ORDER — DOXYCYCLINE HYCLATE 100 MG PO CAPS: 100.0000 mg | ORAL_CAPSULE | Freq: Two times a day (BID) | ORAL | 0 refills | Status: DC

## 2016-11-24 NOTE — Progress Notes (Signed)
History was provided by the patient.  Deanna Valencia is a 21 y.o. female who is here for a f/u on hidraenitis.     HPI:   Patient  was seen on 11/11/16 for swelling/tenderness under the axilla bilaterally that had been ongoing for months. She was started on clindamycin 300mg  BID. She states she still has 2 capsules left.    Patient notes the L side is more tender despite treatment. Unsure if the nodules are larger.  She has redness but not warmth. No drainage (however the area looks like it wants to drain) No fevers, chills. She notes she has had an abscess drained once before in the ED (after she tried to drain it herself). Othertimes she's had issues with abscesses under the axilla but it hasn't been prominent and has resolved with clindamycin topical. She has not used the topical cream since starting oral abx. She stopped shaving in hopes this would help as well.   Not taking anything for the pain currently. She was referred to surgery but has not heard anything from them.  She denies fevers, chills, weight loss, night sweats. She endorsed questionable night sweats at her last visit and a quantiferon gold was obtained and negative.   Physical Exam:  Temp 99.8 F (37.7 C) (Temporal)   Wt 193 lb (87.5 kg)   LMP 11/10/2016   BMI 36.47 kg/m   Growth percentile SmartLinks can only be used for patients less than 47 years old. Patient's last menstrual period was 11/10/2016.    Gen: appears stated age, non-toxic CV: RRR, no m/r/g noted. Lungs: CTAB without increased WOB R axilla: 2.5x1.5cm and 2.0x1cm mildly mobile areas of induration that are mildly tender to palpation.  L axilla: 6.5cm x 4.5cm area of induration and erythema in the lower aspect. Area of fluctuance measuring 2.5cm x 1.5cm. exquisitely tender to palpation.   Incision and Drainage Procedure Note: Discussed risks and benefits of procedure, verbal consent was obtained.  The affected area was cleaned and draped.  Anesthesia was achieved using 4 mL of 2% Lidocaine with epinephrine injected around the wound area using a 25-guage 1.5 inch needle. An 11-blade scalpel was used to incise the wound. Copious amounts purulent drainage was expressed. A hemostat was used to break any loculations that were present. Iodoform guaze was used to pack the wound. A sterile dressing was applied to the area. The patient tolerated the procedure well. No complications were encountered.    Assessment/Plan:  Hidradenitis- exam and history consistent with hidradenitis. Noted to have fluctuance on exam which was drained without complications. No systemic symptoms of infection.  - complete clindamycin course - start doxycyline 100mg  BID x 2 weeks; tetracyclines are typically first-line choice for hidradenitis suppurativa and duration of therapy can be several months, which we will defer to surgery in terms of prolonged antibiotic duration. If patient fails to respond to oral tetracycline, they should then be started on clindamycin and rifampin. Patient reports resolution of symptoms last time she was on doxycycline and topical benzaclin gel, which is why we have chosen this regimen for her - leave packing in place for 24-48hrs, then remove  - re-start topical antibiotic [facility] will see patient and call with appointment; provided patient with phone number in case they do not call by the end of the week - continue warm compresses - dicussed return precautions  - Immunizations today: none  - Follow-up visit on 1/19 for recheck, or sooner as needed.   -  Given her age, she was scheduled to establish care with [employer] - appointment on 12/02/16 at 8:45am  Kathrine Cords, MD  11/24/16  I saw and evaluated the patient, performing the key elements of the service. I developed the management plan that is described in the resident's note, and I agree with the content as it reflects my edits.  Aura Dials, MD                  11/24/2016, 5:45 PM

## 2016-11-24 NOTE — Patient Instructions (Addendum)
[place], you had a large abscess (area of pus) in your left armpit that in drained. Please remove the packing in 2 days. Start applying the topical antibiotics to the area daily, finish your last dose of clindamycin, and start the 2-week course of doxycycline. Continue daily warm compress. You have an appointment with [facility] on 1/23 at 8:45 am. Their address is [address], [place], [place] [address] and their phone number is [phone]. You will also be scheduled with [facility]: [phone] - they will call you with an appointment.

## 2016-11-25 ENCOUNTER — Ambulatory Visit: Payer: Medicaid Other

## 2016-11-28 ENCOUNTER — Ambulatory Visit: Payer: Medicaid Other

## 2016-11-28 ENCOUNTER — Telehealth: Payer: Self-pay | Admitting: Pediatrics

## 2016-11-28 NOTE — Telephone Encounter (Signed)
Called Deanna Valencia because she missed her appointment today. She states the abscess that was drained on 1/15 is healing well, and she had no difficulty pulling out the packing. She has not yet picked up the doxycycline because she needs to pay a co-pay, but is planning to pick it up soon. She is using the benzaclin gel daily, as instructed. When asked, she still has not heard from [facility], but has their phone number and will give them a call by the end of the week if she has not heard from them first. Discussed keeping her upcoming appointment with [place], which she verbalized understanding.

## 2016-12-02 ENCOUNTER — Ambulatory Visit: Payer: Self-pay | Admitting: Family Medicine

## 2016-12-07 ENCOUNTER — Emergency Department (HOSPITAL_COMMUNITY)
Admission: EM | Admit: 2016-12-07 | Discharge: 2016-12-07 | Disposition: A | Discharge status: Home / Self Care | Payer: Medicaid Other | Attending: Emergency Medicine | Admitting: Emergency Medicine

## 2016-12-07 ENCOUNTER — Encounter (HOSPITAL_COMMUNITY): Payer: Self-pay

## 2016-12-07 DIAGNOSIS — F17210 Nicotine dependence, cigarettes, uncomplicated: Secondary | ICD-10-CM | POA: Diagnosis not present

## 2016-12-07 DIAGNOSIS — L02411 Cutaneous abscess of right axilla: Secondary | ICD-10-CM | POA: Insufficient documentation

## 2016-12-07 DIAGNOSIS — Z79899 Other long term (current) drug therapy: Secondary | ICD-10-CM | POA: Diagnosis not present

## 2016-12-07 DIAGNOSIS — L0291 Cutaneous abscess, unspecified: Secondary | ICD-10-CM

## 2016-12-07 MED ORDER — LIDOCAINE-EPINEPHRINE (PF) 2 %-1:200000 IJ SOLN
10.0000 mL | Freq: Once | INTRAMUSCULAR | Status: AC
Start: 2016-12-07 — End: 2016-12-07
  Administered 2016-12-07: 10 mL
  Filled 2016-12-07: qty 20

## 2016-12-07 MED ORDER — ONDANSETRON 4 MG PO TBDP
4.0000 mg | ORAL_TABLET | Freq: Once | ORAL | Status: AC
  Administered 2016-12-07: 4 mg via ORAL

## 2016-12-07 MED ORDER — ONDANSETRON 4 MG PO TBDP
ORAL_TABLET | ORAL | Status: AC
  Filled 2016-12-07: qty 1

## 2016-12-07 NOTE — ED Provider Notes (Signed)
[facility] DEPT Provider Note   CSN: [id number] Arrival date & time: 12/07/16  1056     History   Chief Complaint Chief Complaint  Patient presents with  . abscess/vomiting    HPI Deanna Valencia is a 21 y.o. female with history of hidradenitis suppurativa presents to ED for an right axillary abscess in place for 1 month. She states it is growing and worsening, tender to palpation, and 5/10 pain. Nothing she does makes it better. She reports getting an abscess on her left axillary drained 2 weeks ago, and the right side was apparently not big enough to I&D. She was put on Doxycycline and referred to general surgery but she still has not been seen by general surgery. She reports having 5 episodes of emesis here in Ed She denies fever, chills, abdominal pain, changes in bowel movements, urinary symptoms.   The history is provided by the patient. No language interpreter was used.    Past Medical History:  Diagnosis Date  . Allergy   . Anxiety   . Depression   . Eating disorder   . Foster care (status) 10/18/2013  . UTI (urinary tract infection) 10/03/2013    Patient Active Problem List   Diagnosis Date Noted  . Hidradenitis axillaris 11/11/2016  . Pain in left shin 03/31/2016  . Surveillance of implantable subdermal contraceptive 03/31/2016  . Borderline personality disorder 03/01/2015  . Generalized anxiety disorder 03/01/2015  . MDD (major depressive disorder), recurrent, in partial remission ([place]) 03/01/2015  . PTSD (post-traumatic stress disorder) 03/01/2015    History reviewed. No pertinent surgical history.  OB History    No data available       Home Medications    Prior to Admission medications   Medication Sig Start Date End Date Taking? Authorizing Provider  BENZACLIN gel Apply topically daily. NAME BRAND ONLY FOR MEDICAID Patient not taking: Reported on 11/24/2016 01/14/16   Trude Mcburney, FNP  clindamycin (CLEOCIN) 300 MG capsule Take 300 mg by  mouth 4 (four) times daily.    Historical Provider, MD  doxycycline (VIBRAMYCIN) 100 MG capsule Take 1 capsule (100 mg total) by mouth 2 (two) times daily. 11/24/16   Archie Patten, MD  Prenatal Vit-Fe Fumarate-FA (PRENATAL VITAMIN) 27-0.8 MG TABS Take 1 tablet by mouth daily. Patient not taking: Reported on 11/24/2016 07/22/16   Burna Cash Rumley, DO  QUEtiapine (SEROQUEL) 100 MG tablet Take 100 mg by mouth at bedtime.    Historical Provider, MD    Family History No family history on file.  Social History Social History  Substance Use Topics  . Smoking status: Light Tobacco Smoker    Packs/day: 0.25    Years: 1.00    Types: Cigarettes  . Smokeless tobacco: Never Used     Comment: states smokes occasionally not daily.. black and milds  . Alcohol use No     Comment: occasionally..07/22/16 quit a year ago      Allergies   Amoxicillin and Tomato   Review of Systems Review of Systems  Constitutional: Negative for chills and fever.  Gastrointestinal: Positive for nausea and vomiting. Negative for abdominal pain and diarrhea.  Genitourinary: Negative for difficulty urinating and dysuria.  Skin:       Abscess right axillary  Neurological: Negative for numbness.     Physical Exam Updated Vital Signs BP 144/90   Pulse (!) 57   Temp 97.7 F (36.5 C) (Oral)   Resp 16   LMP 11/10/2016   SpO2 100%  Physical Exam  Constitutional: She is oriented to person, place, and time. She appears well-developed and well-nourished.  Well appearing  HENT:  Head: Normocephalic and atraumatic.  Nose: Nose normal.  Mouth/Throat: Oropharynx is clear and moist.  No erythema.   Eyes: EOM are normal. Pupils are equal, round, and reactive to light.  Neck: Normal range of motion.  Cardiovascular: Normal rate and normal heart sounds.   Pulmonary/Chest: Effort normal. No respiratory distress.  Abdominal: Soft. There is no tenderness. There is no rebound, no guarding, no tenderness at McBurney's  point and negative Murphy's sign.  Soft and nontender. Negative murphy's sign. No focal tenderness at McBurney's point. No rebound or guarding. Negative psoas sign. Negative rovsing sign. Negative obturator sign.   Neurological: She is alert and oriented to person, place, and time.  Skin: Skin is warm.  Abscess visualized on right anterior axillary. TTP. Mild darkening around abscess   Psychiatric: She has a normal mood and affect. Her behavior is normal.  Nursing note and vitals reviewed.    ED Treatments / Results  Labs (all labs ordered are listed, but only abnormal results are displayed) Labs Reviewed - No data to display  EKG  EKG Interpretation None       Radiology No results found.  Procedures .Marland KitchenIncision and Drainage Date/Time: 12/07/2016 3:31 PM Performed by: Bettey Costa Authorized by: Bettey Costa   Consent:    Consent obtained:  Verbal   Consent given by:  Patient   Risks discussed:  Bleeding, incomplete drainage and infection   Alternatives discussed:  No treatment and delayed treatment Location:    Type:  Abscess   Location:  Upper extremity (Right axilla) Pre-procedure details:    Skin preparation:  Betadine Anesthesia (see MAR for exact dosages):    Anesthesia method:  Local infiltration   Local anesthetic:  Lidocaine 2% WITH epi Procedure type:    Complexity:  Simple Procedure details:    Incision types:  Stab incision and single straight   Incision depth:  Dermal   Scalpel blade:  11   Wound management:  Irrigated with saline   Drainage:  Bloody, purulent and serous   Drainage amount:  Copious   Wound treatment:  Wound left open   Packing materials:  1/4 in iodoform gauze   Amount 1/4" iodoform:  10 cm Post-procedure details:    Patient tolerance of procedure:  Tolerated well, no immediate complications   (including critical care time)  Medications Ordered in ED Medications  lidocaine-EPINEPHrine (XYLOCAINE W/EPI)  2 %-1:200000 (PF) injection 10 mL (not administered)  ondansetron (ZOFRAN-ODT) disintegrating tablet 4 mg (4 mg Oral Given 12/07/16 1130)     Initial Impression / Assessment and Plan / ED Course  I have reviewed the triage vital signs and the nursing notes.  Pertinent labs & imaging results that were available during my care of the patient were reviewed by me and considered in my medical decision making (see chart for details).     Patient with skin abscess amenable to incision and drainage.  Abscess was large enough to warrant packing,  wound recheck in 2 days and to remove packing. Encouraged home warm soaks and flushing.  Mild signs of cellulitis is surrounding skin.  Encouraged to continue antbiotics already given to her by her previous provider. She just started antibiotic 2 days ago.  She did not have vomiting prior to walking into hospital. Likely viral etiology. She has had zofran for symptoms. Abdomen soft/nontender. Negative murphy sign,  no focal tenderness at mcburneys point. No More episodes of emesis. Does not appear dehydrated. PO challenged without difficulty.  No concern for gallbladder disease, appendicitis, UTI, kidney stone or other abdominal process.  Will d/c to home.  Given strict instructions to schedule appointment with general surgery tomorrow and follow up with PCP in 2-5 days regarding today's visit.   Final Clinical Impressions(s) / ED Diagnoses   Final diagnoses:  Abscess    New Prescriptions New Prescriptions   No medications on file     [place], [place] 12/07/16 2109    Davonna Belling, MD 12/08/16 [phone]

## 2016-12-07 NOTE — Discharge Instructions (Signed)
Please continue your antibiotics prescribed to by previous provider. Use Tylenol or Profen as needed for pain. Use warm compress to the area and keep clean and dry. Make sure to take iodoform out in 2 days. Please follow-up with general surgery and your primary care physician tomorrow.  Contact a health care provider if: You have a flare-up of hidradenitis suppurativa. You have chills or a fever. You are having trouble controlling your symptoms at home.

## 2016-12-07 NOTE — ED Triage Notes (Signed)
Patient here with abscess to right axilla x 1 month, started vomiting today, denies abdominal pain

## 2016-12-07 NOTE — ED Notes (Signed)
Patient undressed, in gown, on continuous pulse oximetry and blood pressure cuff; visitor at bedside 

## 2016-12-16 ENCOUNTER — Ambulatory Visit (INDEPENDENT_AMBULATORY_CARE_PROVIDER_SITE_OTHER): Payer: Medicaid Other | Admitting: Pediatrics

## 2016-12-16 ENCOUNTER — Encounter: Payer: Self-pay | Admitting: Pediatrics

## 2016-12-16 VITALS — BP 121/77 | HR 62 | Ht 61.0 in | Wt 190.8 lb

## 2016-12-16 DIAGNOSIS — L732 Hidradenitis suppurativa: Secondary | ICD-10-CM | POA: Diagnosis not present

## 2016-12-16 DIAGNOSIS — Z113 Encounter for screening for infections with a predominantly sexual mode of transmission: Secondary | ICD-10-CM | POA: Diagnosis not present

## 2016-12-16 DIAGNOSIS — Z1389 Encounter for screening for other disorder: Secondary | ICD-10-CM | POA: Diagnosis not present

## 2016-12-16 DIAGNOSIS — Z3046 Encounter for surveillance of implantable subdermal contraceptive: Secondary | ICD-10-CM

## 2016-12-16 MED ORDER — BENZACLIN 1-5 % EX GEL: Freq: Every day | CUTANEOUS | 0 refills | Status: DC

## 2016-12-16 NOTE — Patient Instructions (Addendum)
Get scheduled with surgery. Their number is [phone].  We will see you in two weeks for nexplanon removal. Continue considering what other method might be right for you if you aren't ready for pregnancy.

## 2016-12-16 NOTE — Progress Notes (Signed)
THIS RECORD MAY CONTAIN CONFIDENTIAL INFORMATION THAT SHOULD NOT BE RELEASED WITHOUT REVIEW OF THE SERVICE PROVIDER.  Adolescent Medicine Consultation Follow-Up Visit Deanna Valencia  is a 21 y.o. female referred by No ref. provider found here today for follow-up regarding nexplanon concern.    Last seen in [facility] on 07/22/16 for nexplanon follow-up.  Plan at last visit included continue nexplanon.  - Pertinent Labs? No - Growth Chart Viewed? no   History was provided by the patient.  PCP Confirmed?  yes  My Chart Activated?   yes  Enter confidential phone number in Family Comments section of [employer]  Chief Complaint  Patient presents with  . Follow-up    WANTS NEXPLANON REMOVED  . Medication Management    HPI:    Nexplanon put in May of last year. No pain. Maybe 2-3 episodes of bleeding. Very happy with bleeding profile. States that it makes her feel funny. Says she will feel happy and then be very irritated. She says she felt off with the depo too before changing to nexplanon. Makes feel different mood wise. Feels like mood all over the place, makes feel very moody.   Still not wanting to get pregnant but "if it happens it happens". Plan- monitor when ovulating.   Last time took medicine was beginning of 2017. She was on seroquil and vistiril. Not on medicine now and not seeing psychiatry.  seroquil would help when she would be upset.  No LMP recorded (lmp unknown). Allergies  Allergen Reactions  . Amoxicillin Hives  . Tomato Itching and Rash   Outpatient Medications Prior to Visit  Medication Sig Dispense Refill  . BENZACLIN gel Apply topically daily. NAME BRAND ONLY FOR MEDICAID 25 g 0  . clindamycin (CLEOCIN) 300 MG capsule Take 300 mg by mouth 4 (four) times daily.    Marland Kitchen doxycycline (VIBRAMYCIN) 100 MG capsule Take 1 capsule (100 mg total) by mouth 2 (two) times daily. 28 capsule 0  . Prenatal Vit-Fe Fumarate-FA (PRENATAL VITAMIN) 27-0.8 MG  TABS Take 1 tablet by mouth daily. (Patient not taking: Reported on 11/24/2016) 90 tablet 3  . QUEtiapine (SEROQUEL) 100 MG tablet Take 100 mg by mouth at bedtime.     No facility-administered medications prior to visit.      Patient Active Problem List   Diagnosis Date Noted  . Hidradenitis axillaris 11/11/2016  . Pain in left shin 03/31/2016  . Surveillance of implantable subdermal contraceptive 03/31/2016  . Borderline personality disorder 03/01/2015  . Generalized anxiety disorder 03/01/2015  . MDD (major depressive disorder), recurrent, in partial remission ([place]) 03/01/2015  . PTSD (post-traumatic stress disorder) 03/01/2015   Confidentiality was discussed with the patient and if applicable, with caregiver as well.  Tobacco? No- quit cold [place] Drugs/ETOH?  yes, marijuana daily, 2-3 bottles of wine a month Partner preference?  female Sexually Active?  yes  Pregnancy Prevention:  implant, reviewed condoms & plan B Suicidal or Self-Harm thoughts?   no  The following portions of the patient's history were reviewed and updated as appropriate: allergies, current medications, past family history, past medical history, past social history, past surgical history and problem list.  PHQ-SADS 12/16/2016 05/01/2016 03/31/2016 07/31/2015  PHQ-15 3 6 5 14   GAD-7 4 6 10 5   PHQ-9 6 7 5 10   Suicidal Ideation No No No No  Comment somewhat difficult  Somewhat difficult  very difficult    Physical Exam:  Vitals:   12/16/16 1019 12/16/16 1022  BP: 132/90 121/77  Pulse: 72 62  Weight: 190 lb 12.8 oz (86.5 kg)   Height: 5\' 1"  (1.549 m)    BP 121/77 (BP Location: Right Arm, Patient Position: Sitting, Cuff Size: Large)   Pulse 62   Ht 5\' 1"  (1.549 m)   Wt 190 lb 12.8 oz (86.5 kg)   LMP  (LMP Unknown)   BMI 36.05 kg/m  Body mass index: body mass index is 36.05 kg/m. Growth percentile SmartLinks can only be used for patients less than 15 years old.  Physical Exam  Constitutional: She appears  well-developed and well-nourished. No distress.  HENT:  Head: Atraumatic.  Neck: Neck supple. No thyromegaly present.  Cardiovascular: Normal rate and regular rhythm.   Pulmonary/Chest: Effort normal and breath sounds normal.  Abdominal: Soft. She exhibits no distension and no mass. There is no tenderness.  Neurological: She is alert. She displays normal reflexes.  Skin: Skin is warm and dry.  Well-healed scar under left armpit, open wound under right armpit without surrounding erythema, swelling, or discharge.  Psychiatric: She has a normal mood and affect.     Assessment/Plan: 1. Surveillance of implantable subdermal contraceptive - discussed benefits of having Nexplanon in, patient would still like removed, will make follow-up later this month for removal - discussed other birth control options, patient not interested in anything at this time (thinks she and her BF can't get pregant, will monitor ovulation and abstain when ovulating) - currently not on medication where pregnancy is not safe - recommended follow-up with psychiatrist regarding mood and medicaitons  2. Hidradenitis suppurativa - given number for surgery clinic - BENZACLIN gel; Apply topically daily. NAME BRAND ONLY FOR MEDICAID  Dispense: 25 g; Refill: 0  3. Routine screening for STI (sexually transmitted infection) - GC/Chlamydia Probe Amp  Follow-up:  Later this month for nexplonon removal.  Medical decision-making:  >30 minutes spent face to face with patient with more than 50% of appointment spent discussing diagnosis, management, follow-up, and reviewing the plan of care as noted above.    Deanna March, MD [facility], PGY-3 12/16/2016  1:27 PM

## 2016-12-17 LAB — GC/CHLAMYDIA PROBE AMP
CT Probe RNA: NOT DETECTED
GC PROBE AMP APTIMA: NOT DETECTED

## 2016-12-29 ENCOUNTER — Ambulatory Visit (INDEPENDENT_AMBULATORY_CARE_PROVIDER_SITE_OTHER): Payer: Medicaid Other | Admitting: Family

## 2016-12-29 ENCOUNTER — Encounter: Payer: Self-pay | Admitting: Family

## 2016-12-29 VITALS — BP 134/87 | HR 103 | Ht 61.0 in | Wt 191.0 lb

## 2016-12-29 DIAGNOSIS — Z113 Encounter for screening for infections with a predominantly sexual mode of transmission: Secondary | ICD-10-CM | POA: Diagnosis not present

## 2016-12-29 DIAGNOSIS — Z3046 Encounter for surveillance of implantable subdermal contraceptive: Secondary | ICD-10-CM | POA: Diagnosis not present

## 2016-12-29 NOTE — Patient Instructions (Signed)
Deanna Valencia,  [place]  [id number] [address]: [phone]  Follow-up with Dr. Henrene Pastor in 1 month. Schedule this appointment before you leave clinic today.  Your Nexplanon was removed today and is no longer preventing pregnancy.  If you have sex, remember to use condoms to prevent pregnancy and to prevent sexually transmitted infections.  Leave the outside bandage on for 24 hours.  Leave the smaller bandages on for 3-5 days or until they fall off on their own.  Keep the area clean and dry for 3-5 days.  There is usually bruising or swelling at and around the removal site for a few days to a week after the removal.  If you see redness or pus draining from the removal site, call us immediately.  We would like you to return to the clinic for a follow-up visit in 1 month.  You can call [facility] 24 hours a day with any questions or concerns.  There is always a nurse or doctor available to take your call.  Call 9-1-1 if you have a life-threatening emergency.  For anything else, please call us at [phone] before heading to the ER.

## 2016-12-29 NOTE — Progress Notes (Signed)
THIS RECORD MAY CONTAIN CONFIDENTIAL INFORMATION THAT SHOULD NOT BE RELEASED WITHOUT REVIEW OF THE SERVICE PROVIDER.  Adolescent Medicine Consultation Follow-Up Visit Deanna Valencia  is a 21 y.o. female  today for follow-up regarding nexplanon removal.    Last seen in [facility] on 12/16/16 for nexplanon follow-up.  Plan at last visit included return for removal of nexplanon.  - Pertinent Labs? No - Growth Chart Viewed? not applicable   History was provided by the patient.   PCP Confirmed?  no  My Chart Activated?   yes   Chief Complaint  Patient presents with  . Follow-up  . rerpoductive health    HPI:    Deanna Valencia says she still wants her Nexplanon out today. Does not want another form of birth control, plans to monitor her period with a calendar and avoid intercourse when ovulating. She states this worked before and she thinks that her and her boyfriend cannot get pregnant.   No arm pain, normal bleeding pattern.   Also her boyfriend is having some itching and irritation of the private area following intercourse and she wondered if it could be related to her. She has has no symptoms, no pain with sex, no changes in urination, no vaginal discharge or itching. He has tried shaving and this has not helped. He has not had any penile discharge.  No LMP recorded (lmp unknown). Allergies  Allergen Reactions  . Amoxicillin Hives  . Tomato Itching and Rash   Outpatient Medications Prior to Visit  Medication Sig Dispense Refill  . BENZACLIN gel Apply topically daily. NAME BRAND ONLY FOR MEDICAID 25 g 0   No facility-administered medications prior to visit.      Patient Active Problem List   Diagnosis Date Noted  . Hidradenitis axillaris 11/11/2016  . Surveillance of implantable subdermal contraceptive 03/31/2016  . Borderline personality disorder 03/01/2015  . Generalized anxiety disorder 03/01/2015  . MDD (major depressive disorder), recurrent, in partial  remission ([place]) 03/01/2015  . PTSD (post-traumatic stress disorder) 03/01/2015    Physical Exam:  Vitals:   12/29/16 1346  BP: 134/87  Pulse: (!) 103  Weight: 191 lb (86.6 kg)  Height: 5\' 1"  (1.549 m)   BP 134/87 (BP Location: Right Arm, Patient Position: Sitting, Cuff Size: Normal)   Pulse (!) 103   Ht 5\' 1"  (1.549 m)   Wt 191 lb (86.6 kg)   LMP  (LMP Unknown)   BMI 36.09 kg/m  Body mass index: body mass index is 36.09 kg/m. Growth percentile SmartLinks can only be used for patients less than 60 years old.  Physical Exam  Constitutional: She appears well-developed and well-nourished.  HENT:  Head: Normocephalic and atraumatic.  Eyes: Conjunctivae are normal.  Neck: Neck supple.  Cardiovascular: Normal rate, regular rhythm and normal heart sounds.   Pulmonary/Chest: Effort normal and breath sounds normal.  Abdominal: Soft. There is no tenderness.  Neurological: She is alert.  Skin: Skin is warm and dry.  Site of nexplanon without erythema or drainage. Abscess site under right armpit healing well.  Psychiatric: She has a normal mood and affect.    Assessment/Plan: 1. Encounter for Nexplanon removal - follow-up in 1 month  Risks & benefits of Nexplanon removal discussed. Consent form signed.  The patient denies any allergies to anesthetics or antiseptics.  Procedure: Pt was placed in supine position. right arm was flexed at the elbow and externally rotated so that her wrist was parallel to her ear, The device was palpated and  marked. The site was cleaned with Betadine. The area surrounding the device was covered with a sterile drape. 1% lidocaine was injected just under the device. A scalpel was used to create a small incision. The device was pushed towards the incision. Fibrous tissue surrounding the device was gradually removed from the device. The device was removed and measured to ensure all 4 cm of device was removed. Steri-strips were used to close the  incision. Pressure dressing was applied to the patient.  The patient was instructed to removed the pressure dressing in 24 hrs.  The patient was advised to move slowly from a supine to an upright position  The patient denied any concerns or complaints  The patient was instructed to schedule a follow-up appt in 1 month. The patient will be called in 1 week to address any concerns.   2. Routine screening for STI (sexually transmitted infection) - WET PREP BY MOLECULAR PROBE - GC/Chlamydia Probe Amp   Follow-up:  Return for in 1 month for follow-up.   Freddrick March, MD [facility], PGY-3 12/29/2016  2:35 PM

## 2016-12-30 LAB — WET PREP BY MOLECULAR PROBE
CANDIDA SPECIES: NEGATIVE
GARDNERELLA VAGINALIS: POSITIVE — AB
TRICHOMONAS VAG: NEGATIVE

## 2016-12-30 LAB — GC/CHLAMYDIA PROBE AMP
CT Probe RNA: NOT DETECTED
GC Probe RNA: NOT DETECTED

## 2017-01-12 ENCOUNTER — Other Ambulatory Visit: Payer: Self-pay | Admitting: Family

## 2017-01-12 ENCOUNTER — Telehealth: Payer: Self-pay

## 2017-01-12 MED ORDER — METRONIDAZOLE 500 MG PO TABS: 500.0000 mg | ORAL_TABLET | Freq: Two times a day (BID) | ORAL | 0 refills | Status: DC

## 2017-01-12 NOTE — Telephone Encounter (Signed)
-----   Message from Dierdre Harness, NP sent at [id number] 10:49 AM EST ----- Negative gc/c. Will send in Flagyl for bacterial vaginosis.  Do not drink ETOH while taking or for 72 hours after last dose of this medication.

## 2017-01-12 NOTE — Telephone Encounter (Signed)
Called patient and let her know it was in reference to results. I let her know she was Negative gc/c. We sent in Flagyl for bacterial vaginosis.  I let her know to not drink ETOH while taking or for 72 hours after last dose of this medication. Patient stated understanding and said she did not have any further questions. I thanked her for her time and ended the call.

## 2017-01-21 ENCOUNTER — Emergency Department (HOSPITAL_COMMUNITY)
Admission: EM | Admit: 2017-01-21 | Discharge: 2017-01-22 | Disposition: A | Discharge status: Home / Self Care | Payer: Medicaid Other | Attending: Emergency Medicine | Admitting: Emergency Medicine

## 2017-01-21 ENCOUNTER — Encounter (HOSPITAL_COMMUNITY): Payer: Self-pay | Admitting: Emergency Medicine

## 2017-01-21 DIAGNOSIS — Y939 Activity, unspecified: Secondary | ICD-10-CM | POA: Insufficient documentation

## 2017-01-21 DIAGNOSIS — T162XXA Foreign body in left ear, initial encounter: Secondary | ICD-10-CM | POA: Diagnosis not present

## 2017-01-21 DIAGNOSIS — Y929 Unspecified place or not applicable: Secondary | ICD-10-CM | POA: Diagnosis not present

## 2017-01-21 DIAGNOSIS — F17210 Nicotine dependence, cigarettes, uncomplicated: Secondary | ICD-10-CM | POA: Diagnosis not present

## 2017-01-21 DIAGNOSIS — X58XXXA Exposure to other specified factors, initial encounter: Secondary | ICD-10-CM | POA: Diagnosis not present

## 2017-01-21 DIAGNOSIS — Y999 Unspecified external cause status: Secondary | ICD-10-CM | POA: Diagnosis not present

## 2017-01-21 NOTE — Discharge Instructions (Signed)
Recommend refrain from using Q-tips or placing objects in your ear. You may clean your ears using luke warm water and peroxide (50/50 mixture).  Follow up with a primary care provider listed below as needed.

## 2017-01-21 NOTE — ED Triage Notes (Signed)
Pt reports Q-tip stuck in L ear just pta.  Denies pain.

## 2017-01-21 NOTE — ED Provider Notes (Signed)
[facility] DEPT Provider Note    By signing my name below, I, Bea Graff, attest that this documentation has been prepared under the direction and in the presence of Harlene Ramus, PA-C. Electronically Signed: Bea Graff, ED Scribe. 01/21/17. 11:49 PM.    History   Chief Complaint Chief Complaint  Patient presents with  . Foreign Body in [place]    HPI  Deanna Valencia is a 21 y.o. female who presents to the Emergency Department complaining of getting cotton stuck in her left ear that occurred PTA. She reports associated muffled hearing. She has not taken anything for pain relief. She states she did try to retrieve the cotton with the end of the Q-tip that it came off from. There are no modifying factors noted. She denies fever, chills, nausea, vomiting, loss of hearing, ear drainage/bleeding.   Past Medical History:  Diagnosis Date  . Allergy   . Anxiety   . Depression   . Eating disorder   . Foster care (status) 10/18/2013  . UTI (urinary tract infection) 10/03/2013    Patient Active Problem List   Diagnosis Date Noted  . Hidradenitis axillaris 11/11/2016  . Surveillance of implantable subdermal contraceptive 03/31/2016  . Borderline personality disorder 03/01/2015  . Generalized anxiety disorder 03/01/2015  . MDD (major depressive disorder), recurrent, in partial remission ([place]) 03/01/2015  . PTSD (post-traumatic stress disorder) 03/01/2015    History reviewed. No pertinent surgical history.  OB History    No data available       Home Medications    Prior to Admission medications   Medication Sig Start Date End Date Taking? Authorizing Provider  BENZACLIN gel Apply topically daily. NAME BRAND ONLY FOR MEDICAID 12/16/16   Ronny Flurry, MD  metroNIDAZOLE (FLAGYL) 500 MG tablet Take 1 tablet (500 mg total) by mouth 2 (two) times daily. 0/8/65   Dierdre Harness, NP    Family History No family history on file.  Social History Social History    Substance Use Topics  . Smoking status: Light Tobacco Smoker    Packs/day: 0.25    Years: 1.00    Types: Cigarettes  . Smokeless tobacco: Never Used     Comment: states smokes occasionally not daily.. black and milds  . Alcohol use 0.0 oz/week     Comment: occasionally..07/22/16 quit a year ago... 12-16-16  wine      Allergies   Amoxicillin and Tomato   Review of Systems Review of Systems  Constitutional: Negative for chills and fever.  HENT: Positive for ear pain. Negative for ear discharge and hearing loss.   Gastrointestinal: Negative for nausea and vomiting.     Physical Exam Updated Vital Signs BP 129/72 (BP Location: Right Arm)   Pulse 60   Temp 98.8 F (37.1 C) (Oral)   Resp 22   SpO2 99%   Physical Exam  Constitutional: She is oriented to person, place, and time. She appears well-developed and well-nourished.  HENT:  Head: Normocephalic and atraumatic.  Right Ear: Hearing, tympanic membrane, external ear and ear canal normal.  Left Ear: Hearing and external ear normal. No drainage, swelling or tenderness. A foreign body (cotton ball) is present. No mastoid tenderness.  Unable to visualized left TM due to foreign body.  Eyes: Conjunctivae and EOM are normal. Right eye exhibits no discharge. Left eye exhibits no discharge. No scleral icterus.  Neck: Normal range of motion. Neck supple.  Cardiovascular: Normal rate.   Pulmonary/Chest: Effort normal.  Neurological: She is alert and oriented  to person, place, and time.  Skin: Skin is warm and dry.  Nursing note and vitals reviewed.    ED Treatments / Results  DIAGNOSTIC STUDIES: Oxygen Saturation is 99% on RA, normal by my interpretation.   COORDINATION OF CARE: 11:19 PM- Will remove foreign body. Pt verbalizes understanding and agrees to plan.  Medications - No data to display  Labs (all labs ordered are listed, but only abnormal results are displayed) Labs Reviewed - No data to display  EKG  EKG  Interpretation None       Radiology No results found.  Procedures .Foreign Body Removal Date/Time: 01/21/2017 11:40 PM Performed by: Nona Dell Authorized by: Nona Dell  Consent: Verbal consent obtained. Written consent not obtained. Risks and benefits: risks, benefits and alternatives were discussed Consent given by: patient Patient understanding: patient states understanding of the procedure being performed Patient consent: the patient's understanding of the procedure matches consent given Procedure consent: procedure consent matches procedure scheduled Required items: required blood products, implants, devices, and special equipment available Patient identity confirmed: verbally with patient and arm band Body area: ear Location details: left ear  Sedation: Patient sedated: no Patient restrained: no Patient cooperative: yes Localization method: visualized Removal mechanism: forceps Complexity: simple 1 objects recovered. Objects recovered: cotton swab Post-procedure assessment: foreign body removed Patient tolerance: Patient tolerated the procedure well with no immediate complications   (including critical care time)  Medications Ordered in ED Medications - No data to display   Initial Impression / Assessment and Plan / ED Course  I have reviewed the triage vital signs and the nursing notes.  Pertinent labs & imaging results that were available during my care of the patient were reviewed by me and considered in my medical decision making (see chart for details).     Pt presenting with a foreign body to the left ear canal. Pt states she was cleaning her ear with a cotton swab PTA when it became lodged in the canal. Cotton removed without complication or difficulty. Left TM clear. Pt afebrile in NAD. Exam not concerning for mastoiditis, cellulitis or malignant OE. Return precautions discussed. Pt appears safe for discharge.    I  personally performed the services described in this documentation, which was scribed in my presence. The recorded information has been reviewed and is accurate.    Final Clinical Impressions(s) / ED Diagnoses   Final diagnoses:  Foreign body of left ear, initial encounter    New Prescriptions New Prescriptions   No medications on file     Nona Dell, PA-C 01/23/17 2009    Milton Ferguson, MD 01/24/17 [id number]

## 2017-01-21 NOTE — ED Notes (Signed)
See EDP assessment 

## 2017-01-30 ENCOUNTER — Encounter: Payer: Self-pay | Admitting: Family

## 2017-01-30 ENCOUNTER — Ambulatory Visit (INDEPENDENT_AMBULATORY_CARE_PROVIDER_SITE_OTHER): Payer: Medicaid Other | Admitting: Family

## 2017-01-30 VITALS — BP 134/87 | HR 61 | Ht 61.5 in | Wt 189.0 lb

## 2017-01-30 DIAGNOSIS — Z3169 Encounter for other general counseling and advice on procreation: Secondary | ICD-10-CM

## 2017-01-30 MED ORDER — PRENATAL VITAMINS 28-0.8 MG PO TABS: 1.0000 | ORAL_TABLET | Freq: Every day | ORAL | 1 refills | Status: DC

## 2017-01-30 NOTE — Progress Notes (Signed)
THIS RECORD MAY CONTAIN CONFIDENTIAL INFORMATION THAT SHOULD NOT BE RELEASED WITHOUT REVIEW OF THE SERVICE PROVIDER.  Adolescent Medicine Consultation Follow-Up Visit Deanna Valencia  is a 21 y.o. female referred by No ref. provider found here today for follow-up regarding follow-up from Nexplanon removal last month.    Last seen in [facility] on 12/29/16 for nexplanon removal.   - Pertinent Labs? No - Growth Chart Viewed? no   History was provided by the patient.  PCP Confirmed?  no  My Chart Activated?   yes    Chief Complaint  Patient presents with  . Follow-up  . REPRODUCTIVE HEALTH    HPI:    Everything fine since nexplanon removal.  LMP yesterday.  Sexually active, not trying to stop pregnancy.  Denies vaginal discharge changes, painful intercourse, lesions, or pelvic pain.  Not taking prenatals.  Drinks wine occasionally; no drug use.   Review of Systems  Constitutional: Negative for malaise/fatigue.  Eyes: Negative for double vision.  Respiratory: Negative for shortness of breath.   Cardiovascular: Negative for chest pain and palpitations.  Gastrointestinal: Negative for abdominal pain, constipation, diarrhea, nausea and vomiting.  Genitourinary: Negative for dysuria.  Musculoskeletal: Negative for joint pain and myalgias.  Skin: Negative for rash.  Neurological: Negative for dizziness and headaches.  Endo/Heme/Allergies: Does not bruise/bleed easily.    No LMP recorded. Patient has had an implant. Allergies  Allergen Reactions  . Amoxicillin Hives  . Tomato Itching and Rash   Outpatient Medications Prior to Visit  Medication Sig Dispense Refill  . BENZACLIN gel Apply topically daily. NAME BRAND ONLY FOR MEDICAID (Patient not taking: Reported on 01/30/2017) 25 g 0  . metroNIDAZOLE (FLAGYL) 500 MG tablet Take 1 tablet (500 mg total) by mouth 2 (two) times daily. (Patient not taking: Reported on 01/30/2017) 14 tablet 0   No  facility-administered medications prior to visit.      Patient Active Problem List   Diagnosis Date Noted  . Hidradenitis axillaris 11/11/2016  . Surveillance of implantable subdermal contraceptive 03/31/2016  . Borderline personality disorder 03/01/2015  . Generalized anxiety disorder 03/01/2015  . MDD (major depressive disorder), recurrent, in partial remission ([place]) 03/01/2015  . PTSD (post-traumatic stress disorder) 03/01/2015     Physical Exam:  Vitals:   01/30/17 1007  BP: 134/87  Pulse: 61  Weight: 189 lb (85.7 kg)  Height: 5' 1.5" (1.562 m)   BP 134/87 (BP Location: Right Arm, Patient Position: Sitting, Cuff Size: Large)   Pulse 61   Ht 5' 1.5" (1.562 m)   Wt 189 lb (85.7 kg)   BMI 35.13 kg/m  Body mass index: body mass index is 35.13 kg/m. Growth percentile SmartLinks can only be used for patients less than 28 years old.  Wt Readings from Last 3 Encounters:  01/30/17 189 lb (85.7 kg)  12/29/16 191 lb (86.6 kg)  12/16/16 190 lb 12.8 oz (86.5 kg)    Physical Exam  Constitutional: She is oriented to person, place, and time. She appears well-developed. No distress.  HENT:  Head: Normocephalic and atraumatic.  Eyes: EOM are normal. Pupils are equal, round, and reactive to light. No scleral icterus.  Neck: Normal range of motion. Neck supple. No thyromegaly present.  Cardiovascular: Normal rate, regular rhythm, normal heart sounds and intact distal pulses.   No murmur heard. Pulmonary/Chest: Effort normal and breath sounds normal.  Abdominal: Soft.  Musculoskeletal: Normal range of motion. She exhibits no edema.  Lymphadenopathy:    She has no  cervical adenopathy.  Neurological: She is alert and oriented to person, place, and time. No cranial nerve deficit.  Skin: Skin is warm and dry. No rash noted.  Psychiatric: She has a normal mood and affect. Her behavior is normal. Judgment and thought content normal.    Assessment/Plan: 1. Encounter for preconception  consultation -start PNVs, return for concerning symptoms  - Prenatal Vit-Fe Fumarate-FA (PRENATAL VITAMINS) 28-0.8 MG TABS; Take 1 tablet by mouth daily after breakfast.  Dispense: 90 tablet; Refill: 1   Follow-up:  Return if symptoms worsen or fail to improve.   Medical decision-making:  >15 minutes spent face to face with patient with more than 50% of appointment spent discussing diagnosis, management, follow-up, and reviewing of importance of PNVs, preconception healthy weight, healthy diet, avoiding ETOH and drug use.

## 2017-01-30 NOTE — Patient Instructions (Signed)
Take prenatal vitamins daily with food. Return as needed.

## 2017-03-30 ENCOUNTER — Ambulatory Visit (INDEPENDENT_AMBULATORY_CARE_PROVIDER_SITE_OTHER): Payer: Medicaid Other | Admitting: Family

## 2017-03-30 ENCOUNTER — Encounter: Payer: Self-pay | Admitting: Family

## 2017-03-30 VITALS — BP 124/79 | HR 93 | Ht 61.0 in | Wt 189.0 lb

## 2017-03-30 DIAGNOSIS — Z3201 Encounter for pregnancy test, result positive: Secondary | ICD-10-CM | POA: Diagnosis not present

## 2017-03-30 DIAGNOSIS — Z3169 Encounter for other general counseling and advice on procreation: Secondary | ICD-10-CM | POA: Diagnosis not present

## 2017-03-30 DIAGNOSIS — Z3490 Encounter for supervision of normal pregnancy, unspecified, unspecified trimester: Secondary | ICD-10-CM

## 2017-03-30 LAB — POCT URINE PREGNANCY: PREG TEST UR: POSITIVE — AB

## 2017-03-30 NOTE — Progress Notes (Signed)
THIS RECORD MAY CONTAIN CONFIDENTIAL INFORMATION THAT SHOULD NOT BE RELEASED WITHOUT REVIEW OF THE SERVICE PROVIDER.  Adolescent Medicine Consultation Follow-Up Visit Deanna Valencia  is a 21 y.o. female here today for follow-up regarding pregnancy planning.    Last seen in [facility] on 01/30/17 for pre-conception counseling.  Plan at last visit included starting pre-natal vitamins and returning for any concerning symptoms.  - Pertinent Labs? Yes - Growth Chart Viewed? not applicable   History was provided by the patient.   HPI:    Deanna Valencia is a 21 year old female who was last seen on 3/23 for nexplanon removal and pre-conception counseling.  She expressed interest in becoming pregnant at that time. Sexually active with her boyfriend.  Was recommended to start taking prenatal vitamins at that time.  Today, she states that she is not currently taking prenatal vitamins.  She states that she has smoked marijuana in the past but has stopped. Denies alcohol or tobacco use. Not currently taking any medications. No fevers, abdominal pain or vomiting.   Patient's last menstrual period was 02/26/2017. Allergies  Allergen Reactions  . Amoxicillin Hives  . Tomato Itching and Rash   Outpatient Medications Prior to Visit  Medication Sig Dispense Refill  . BENZACLIN gel Apply topically daily. NAME BRAND ONLY FOR MEDICAID (Patient not taking: Reported on 01/30/2017) 25 g 0  . metroNIDAZOLE (FLAGYL) 500 MG tablet Take 1 tablet (500 mg total) by mouth 2 (two) times daily. (Patient not taking: Reported on 01/30/2017) 14 tablet 0  . Prenatal Vit-Fe Fumarate-FA (PRENATAL VITAMINS) 28-0.8 MG TABS Take 1 tablet by mouth daily after breakfast. 90 tablet 1   No facility-administered medications prior to visit.      Patient Active Problem List   Diagnosis Date Noted  . Hidradenitis axillaris 11/11/2016  . Surveillance of implantable subdermal contraceptive 03/31/2016  . Borderline  personality disorder 03/01/2015  . Generalized anxiety disorder 03/01/2015  . MDD (major depressive disorder), recurrent, in partial remission ([place]) 03/01/2015  . PTSD (post-traumatic stress disorder) 03/01/2015    Social History: Lives with:  Lives alone in an apartment School: Going to school at [employer]; is studying psychology Future Plans:  currently in college Sleep:  No sleep issues, but sleeps a lot, goes to bed at 10 and wakes up at 6, then naps throughout the day  Confidentiality was discussed with the patient and if applicable, with caregiver as well.  Tobacco?  no Drugs/ETOH?  No ETOH, smokes marijuana, counseled to stop Partner preference?  female Sexually Active?  yes  Pregnancy Prevention:  none   Review of systems as given in HPI.  The following portions of the patient's history were reviewed and updated as appropriate: allergies, current medications, past medical history, past social history and problem list.  Physical Exam:  Vitals:   03/30/17 1404 03/30/17 1408  BP: 136/88 124/79  Pulse: 91 93  Weight: 189 lb (85.7 kg)   Height: 5\' 1"  (1.549 m)    BP 124/79   Pulse 93   Ht 5\' 1"  (1.549 m)   Wt 189 lb (85.7 kg)   LMP 02/26/2017   BMI 35.71 kg/m  Body mass index: body mass index is 35.71 kg/m. Growth percentile SmartLinks can only be used for patients less than 20 years old.    Physical Exam   General: alert, interactive and pleasant. No acute distress HEENT: normocephalic, atraumatic. PERRL. Moist mucus membranes. Oropharynx benign without lesions Neck: Supple, no lymphadenopathy Cardiac: normal S1 and  S2. Regular rate and rhythm. No murmurs Pulmonary: normal work of breathing. Clear bilaterally  Abdomen: soft, nontender, nondistended. Extremities: Warm and well-perfused. 2+ radial pulses. Brisk capillary refill Skin: no rashes, lesions   Assessment/Plan: 1. Pregnancy at early stage Positive pregnancy test today in clinic.  Was planned as patient  had nexplanon removed at last visit and discussed plans with provider. Counseled on use of prenatal vitamins and cessation of any substance use.  Gave contact information for women's clinic at [facility]. Recommended transitioning care.  2. Pregnancy test positive - POCT urine pregnancy positive   Follow-up:  Return if symptoms worsen or fail to improve.   Medical decision-making:  >15 minutes spent face to face with patient with more than 50% of appointment spent discussing prenatal vitamin use, importance of establishing care with adult provider/OBGYN, cessation of substance use, positive pregnancy test.   [facility] PGY-2

## 2017-03-30 NOTE — Patient Instructions (Signed)
Deanna Valencia, it was a pleasure seeing you today!   Congratulations on your pregnancy.  Please start taking prenatal vitamins. The prescription is already at your pharmacy.  Please avoid alcohol, tobacco or other substances for the duration of your pregnancy.  Please make a doctor's appointment with an OBGYN.  Two options are the Women's Clinic at the [facility] and the health department. The numbers are given below:   Center for [facility] [address]  [phone]  [facility] [place] [phone]

## 2017-04-01 ENCOUNTER — Telehealth: Payer: Self-pay | Admitting: General Practice

## 2017-04-01 NOTE — Telephone Encounter (Signed)
Also got a call from [facility] stating he received an RX for prenatal vitamins and states he does not have prenatal vitamin in stock. Looks like patient would like RX sent to [facility] on [address].

## 2017-04-01 NOTE — Telephone Encounter (Signed)
The patient called in stating that she went to the pharmacy to go pick up her prenatal vitamins and she was told they hadn't received an Rx for them. The patient was seen on 03/30/17 and she states that she was told the Rx would be sent at that visit. She would like the vitamins sent to the [facility] on [address]. Please call her when they have been sent at [phone].

## 2017-04-02 ENCOUNTER — Other Ambulatory Visit: Payer: Self-pay | Admitting: Pediatrics

## 2017-04-02 MED ORDER — PRENATAL VITAMIN 27-0.8 MG PO TABS: 1.0000 | ORAL_TABLET | Freq: Every day | ORAL | 3 refills | Status: DC

## 2017-04-02 NOTE — Telephone Encounter (Signed)
Done

## 2017-04-02 NOTE — Telephone Encounter (Signed)
Made pt aware of RX prescribed at preferred pharmacy.

## 2017-05-05 ENCOUNTER — Encounter (HOSPITAL_COMMUNITY): Payer: Self-pay | Admitting: *Deleted

## 2017-05-05 ENCOUNTER — Inpatient Hospital Stay (HOSPITAL_COMMUNITY)
Admission: AD | Admit: 2017-05-05 | Discharge: 2017-05-05 | Disposition: A | Discharge status: Home / Self Care | Payer: Medicaid Other | Source: Ambulatory Visit | Attending: Family Medicine | Admitting: Family Medicine

## 2017-05-05 ENCOUNTER — Inpatient Hospital Stay (HOSPITAL_COMMUNITY): Payer: Medicaid Other

## 2017-05-05 DIAGNOSIS — Z87891 Personal history of nicotine dependence: Secondary | ICD-10-CM | POA: Insufficient documentation

## 2017-05-05 DIAGNOSIS — D279 Benign neoplasm of unspecified ovary: Secondary | ICD-10-CM

## 2017-05-05 DIAGNOSIS — F419 Anxiety disorder, unspecified: Secondary | ICD-10-CM | POA: Diagnosis not present

## 2017-05-05 DIAGNOSIS — Z3A09 9 weeks gestation of pregnancy: Secondary | ICD-10-CM | POA: Insufficient documentation

## 2017-05-05 DIAGNOSIS — R109 Unspecified abdominal pain: Secondary | ICD-10-CM | POA: Insufficient documentation

## 2017-05-05 DIAGNOSIS — F329 Major depressive disorder, single episode, unspecified: Secondary | ICD-10-CM | POA: Insufficient documentation

## 2017-05-05 DIAGNOSIS — O219 Vomiting of pregnancy, unspecified: Secondary | ICD-10-CM

## 2017-05-05 DIAGNOSIS — Z3491 Encounter for supervision of normal pregnancy, unspecified, first trimester: Secondary | ICD-10-CM

## 2017-05-05 DIAGNOSIS — O3412 Maternal care for benign tumor of corpus uteri, second trimester: Secondary | ICD-10-CM | POA: Diagnosis not present

## 2017-05-05 DIAGNOSIS — D259 Leiomyoma of uterus, unspecified: Secondary | ICD-10-CM | POA: Diagnosis not present

## 2017-05-05 DIAGNOSIS — M545 Low back pain: Secondary | ICD-10-CM | POA: Diagnosis not present

## 2017-05-05 DIAGNOSIS — O26891 Other specified pregnancy related conditions, first trimester: Secondary | ICD-10-CM | POA: Diagnosis not present

## 2017-05-05 DIAGNOSIS — O9989 Other specified diseases and conditions complicating pregnancy, childbirth and the puerperium: Secondary | ICD-10-CM

## 2017-05-05 DIAGNOSIS — Z880 Allergy status to penicillin: Secondary | ICD-10-CM | POA: Diagnosis not present

## 2017-05-05 DIAGNOSIS — O3410 Maternal care for benign tumor of corpus uteri, unspecified trimester: Secondary | ICD-10-CM

## 2017-05-05 DIAGNOSIS — R102 Pelvic and perineal pain: Secondary | ICD-10-CM | POA: Diagnosis present

## 2017-05-05 DIAGNOSIS — O26899 Other specified pregnancy related conditions, unspecified trimester: Secondary | ICD-10-CM

## 2017-05-05 DIAGNOSIS — O99342 Other mental disorders complicating pregnancy, second trimester: Secondary | ICD-10-CM | POA: Insufficient documentation

## 2017-05-05 DIAGNOSIS — O3481 Maternal care for other abnormalities of pelvic organs, first trimester: Secondary | ICD-10-CM

## 2017-05-05 LAB — URINALYSIS, ROUTINE W REFLEX MICROSCOPIC
Bacteria, UA: NONE SEEN
Bilirubin Urine: NEGATIVE
GLUCOSE, UA: NEGATIVE mg/dL
HGB URINE DIPSTICK: NEGATIVE
Ketones, ur: NEGATIVE mg/dL
NITRITE: NEGATIVE
PH: 5 (ref 5.0–8.0)
PROTEIN: 30 mg/dL — AB
SPECIFIC GRAVITY, URINE: 1.029 (ref 1.005–1.030)

## 2017-05-05 LAB — CBC
HEMATOCRIT: 31.4 % — AB (ref 36.0–46.0)
HEMOGLOBIN: 11.4 g/dL — AB (ref 12.0–15.0)
MCH: 31.3 pg (ref 26.0–34.0)
MCHC: 36.3 g/dL — AB (ref 30.0–36.0)
MCV: 86.3 fL (ref 78.0–100.0)
Platelets: 240 10*3/uL (ref 150–400)
RBC: 3.64 MIL/uL — ABNORMAL LOW (ref 3.87–5.11)
RDW: 11.7 % (ref 11.5–15.5)
WBC: 6.7 10*3/uL (ref 4.0–10.5)

## 2017-05-05 LAB — WET PREP, GENITAL
SPERM: NONE SEEN
Trich, Wet Prep: NONE SEEN
Yeast Wet Prep HPF POC: NONE SEEN

## 2017-05-05 LAB — ABO/RH: ABO/RH(D): O POS

## 2017-05-05 LAB — HCG, QUANTITATIVE, PREGNANCY: HCG, BETA CHAIN, QUANT, S: 133377 m[IU]/mL — AB (ref <5)

## 2017-05-05 NOTE — Discharge Instructions (Signed)
Nausea medication to take during pregnancy:   Unisom (doxylamine succinate 25 mg tablets) Take one tablet daily at bedtime. If symptoms are not adequately controlled, the dose can be increased to a maximum recommended dose of two tablets daily (1/2 tablet in the morning, 1/2 tablet mid-afternoon and one at bedtime). Vitamin B6 100mg  tablets. Take one tablet twice a day (up to 200 mg per day).   First Trimester of Pregnancy The first trimester of pregnancy is from week 1 until the end of week 13 (months 1 through 3). A week after a sperm fertilizes an egg, the egg will implant on the wall of the uterus. This embryo will begin to develop into a baby. Genes from you and your partner will form the baby. The female genes will determine whether the baby will be a boy or a girl. At 6-8 weeks, the eyes and face will be formed, and the heartbeat can be seen on ultrasound. At the end of 12 weeks, all the baby's organs will be formed. Now that you are pregnant, you will want to do everything you can to have a healthy baby. Two of the most important things are to get good prenatal care and to follow your health care provider's instructions. Prenatal care is all the medical care you receive before the baby's birth. This care will help prevent, find, and treat any problems during the pregnancy and childbirth. Body changes during your first trimester Your body goes through many changes during pregnancy. The changes vary from woman to woman.  You may gain or lose a couple of pounds at first.  You may feel sick to your stomach (nauseous) and you may throw up (vomit). If the vomiting is uncontrollable, call your health care provider.  You may tire easily.  You may develop headaches that can be relieved by medicines. All medicines should be approved by your health care provider.  You may urinate more often. Painful urination may mean you have a bladder infection.  You may develop heartburn as a result of your  pregnancy.  You may develop constipation because certain hormones are causing the muscles that push stool through your intestines to slow down.  You may develop hemorrhoids or swollen veins (varicose veins).  Your breasts may begin to grow larger and become tender. Your nipples may stick out more, and the tissue that surrounds them (areola) may become darker.  Your gums may bleed and may be sensitive to brushing and flossing.  Dark spots or blotches (chloasma, mask of pregnancy) may develop on your face. This will likely fade after the baby is born.  Your menstrual periods will stop.  You may have a loss of appetite.  You may develop cravings for certain kinds of food.  You may have changes in your emotions from day to day, such as being excited to be pregnant or being concerned that something may go wrong with the pregnancy and baby.  You may have more vivid and strange dreams.  You may have changes in your hair. These can include thickening of your hair, rapid growth, and changes in texture. Some women also have hair loss during or after pregnancy, or hair that feels dry or thin. Your hair will most likely return to normal after your baby is born.  What to expect at prenatal visits During a routine prenatal visit:  You will be weighed to make sure you and the baby are growing normally.  Your blood pressure will be taken.  Your abdomen  will be measured to track your baby's growth.  The fetal heartbeat will be listened to between weeks 10 and 14 of your pregnancy.  Test results from any previous visits will be discussed.  Your health care provider may ask you:  How you are feeling.  If you are feeling the baby move.  If you have had any abnormal symptoms, such as leaking fluid, bleeding, severe headaches, or abdominal cramping.  If you are using any tobacco products, including cigarettes, chewing tobacco, and electronic cigarettes.  If you have any questions.  Other  tests that may be performed during your first trimester include:  Blood tests to find your blood type and to check for the presence of any previous infections. The tests will also be used to check for low iron levels (anemia) and protein on red blood cells (Rh antibodies). Depending on your risk factors, or if you previously had diabetes during pregnancy, you may have tests to check for high blood sugar that affects pregnant women (gestational diabetes).  Urine tests to check for infections, diabetes, or protein in the urine.  An ultrasound to confirm the proper growth and development of the baby.  Fetal screens for spinal cord problems (spina bifida) and Down syndrome.  HIV (human immunodeficiency virus) testing. Routine prenatal testing includes screening for HIV, unless you choose not to have this test.  You may need other tests to make sure you and the baby are doing well.  Follow these instructions at home: Medicines  Follow your health care provider's instructions regarding medicine use. Specific medicines may be either safe or unsafe to take during pregnancy.  Take a prenatal vitamin that contains at least 600 micrograms (mcg) of folic acid.  If you develop constipation, try taking a stool softener if your health care provider approves. Eating and drinking  Eat a balanced diet that includes fresh fruits and vegetables, whole grains, good sources of protein such as meat, eggs, or tofu, and low-fat dairy. Your health care provider will help you determine the amount of weight gain that is right for you.  Avoid raw meat and uncooked cheese. These carry germs that can cause birth defects in the baby.  Eating four or five small meals rather than three large meals a day may help relieve nausea and vomiting. If you start to feel nauseous, eating a few soda crackers can be helpful. Drinking liquids between meals, instead of during meals, also seems to help ease nausea and vomiting.  Limit  foods that are high in fat and processed sugars, such as fried and sweet foods.  To prevent constipation: ? Eat foods that are high in fiber, such as fresh fruits and vegetables, whole grains, and beans. ? Drink enough fluid to keep your urine clear or pale yellow. Activity  Exercise only as directed by your health care provider. Most women can continue their usual exercise routine during pregnancy. Try to exercise for 30 minutes at least 5 days a week. Exercising will help you: ? Control your weight. ? Stay in shape. ? Be prepared for labor and delivery.  Experiencing pain or cramping in the lower abdomen or lower back is a good sign that you should stop exercising. Check with your health care provider before continuing with normal exercises.  Try to avoid standing for long periods of time. Move your legs often if you must stand in one place for a long time.  Avoid heavy lifting.  Wear low-heeled shoes and practice good posture.  You  may continue to have sex unless your health care provider tells you not to. Relieving pain and discomfort  Wear a good support bra to relieve breast tenderness.  Take warm sitz baths to soothe any pain or discomfort caused by hemorrhoids. Use hemorrhoid cream if your health care provider approves.  Rest with your legs elevated if you have leg cramps or low back pain.  If you develop varicose veins in your legs, wear support hose. Elevate your feet for 15 minutes, 3-4 times a day. Limit salt in your diet. Prenatal care  Schedule your prenatal visits by the twelfth week of pregnancy. They are usually scheduled monthly at first, then more often in the last 2 months before delivery.  Write down your questions. Take them to your prenatal visits.  Keep all your prenatal visits as told by your health care provider. This is important. Safety  Wear your seat belt at all times when driving.  Make a list of emergency phone numbers, including numbers for  family, friends, the hospital, and police and fire departments. General instructions  Ask your health care provider for a referral to a local prenatal education class. Begin classes no later than the beginning of month 6 of your pregnancy.  Ask for help if you have counseling or nutritional needs during pregnancy. Your health care provider can offer advice or refer you to specialists for help with various needs.  Do not use hot tubs, steam rooms, or saunas.  Do not douche or use tampons or scented sanitary pads.  Do not cross your legs for long periods of time.  Avoid cat litter boxes and soil used by cats. These carry germs that can cause birth defects in the baby and possibly loss of the fetus by miscarriage or stillbirth.  Avoid all smoking, herbs, alcohol, and medicines not prescribed by your health care provider. Chemicals in these products affect the formation and growth of the baby.  Do not use any products that contain nicotine or tobacco, such as cigarettes and e-cigarettes. If you need help quitting, ask your health care provider. You may receive counseling support and other resources to help you quit.  Schedule a dentist appointment. At home, brush your teeth with a soft toothbrush and be gentle when you floss. Contact a health care provider if:  You have dizziness.  You have mild pelvic cramps, pelvic pressure, or nagging pain in the abdominal area.  You have persistent nausea, vomiting, or diarrhea.  You have a bad smelling vaginal discharge.  You have pain when you urinate.  You notice increased swelling in your face, hands, legs, or ankles.  You are exposed to fifth disease or chickenpox.  You are exposed to [place] measles (rubella) and have never had it. Get help right away if:  You have a fever.  You are leaking fluid from your vagina.  You have spotting or bleeding from your vagina.  You have severe abdominal cramping or pain.  You have rapid weight  gain or loss.  You vomit blood or material that looks like coffee grounds.  You develop a severe headache.  You have shortness of breath.  You have any kind of trauma, such as from a fall or a car accident. Summary  The first trimester of pregnancy is from week 1 until the end of week 13 (months 1 through 3).  Your body goes through many changes during pregnancy. The changes vary from woman to woman.  You will have routine prenatal visits. During  those visits, your health care provider will examine you, discuss any test results you may have, and talk with you about how you are feeling. This information is not intended to replace advice given to you by your health care provider. Make sure you discuss any questions you have with your health care provider. Document Released: 10/21/2001 Document Revised: 10/08/2016 Document Reviewed: 10/08/2016 Elsevier Interactive Patient Education  2017 [employer] Abdominal Pain During Pregnancy Belly (abdominal) pain is common during pregnancy. Most of the time, it is not a serious problem. Other times, it can be a sign that something is wrong with the pregnancy. Always tell your doctor if you have belly pain. Follow these instructions at home: Monitor your belly pain for any changes. The following actions may help you feel better:  Do not have sex (intercourse) or put anything in your vagina until you feel better.  Rest until your pain stops.  Drink clear fluids if you feel sick to your stomach (nauseous). Do not eat solid food until you feel better.  Only take medicine as told by your doctor.  Keep all doctor visits as told.  Get help right away if:  You are bleeding, leaking fluid, or pieces of tissue come out of your vagina.  You have more pain or cramping.  You keep throwing up (vomiting).  You have pain when you pee (urinate) or have blood in your pee.  You have a fever.  You do not feel your baby moving as much.  You feel  very weak or feel like passing out.  You have trouble breathing, with or without belly pain.  You have a very bad headache and belly pain.  You have fluid leaking from your vagina and belly pain.  You keep having watery poop (diarrhea).  Your belly pain does not go away after resting, or the pain gets worse. This information is not intended to replace advice given to you by your health care provider. Make sure you discuss any questions you have with your health care provider. Document Released: 10/15/2009 Document Revised: 06/04/2016 Document Reviewed: 05/26/2013 Elsevier Interactive Patient Education  [employer].

## 2017-05-05 NOTE — MAU Provider Note (Signed)
History     CSN: [id number]  Arrival date and time: 05/05/17 1206   First Provider Initiated Contact with Patient 05/05/17 1302      Chief Complaint  Patient presents with  . Pelvic Pain  . Back Pain   HPI Deanna Valencia is a 21 y.o. G1P0 at [redacted]w[redacted]d who presents complaining of pelvic pressure and back pain for 2 days. She states the pain is constant and rates it a 5/10. She has not tried anything for the pain. She denies any vaginal discharge or bleeding. Last intercourse was last week. LMP 02/26/17. She had a positive pregnancy test in May but has not been seen anywhere in this pregnancy.   OB History    Gravida Para Term Preterm AB Living   1             SAB TAB Ectopic Multiple Live Births                  Past Medical History:  Diagnosis Date  . Allergy   . Anxiety   . Depression   . Eating disorder   . Foster care (status) 10/18/2013  . UTI (urinary tract infection) 10/03/2013    History reviewed. No pertinent surgical history.  History reviewed. No pertinent family history.  Social History  Substance Use Topics  . Smoking status: Former Smoker    Packs/day: 0.25    Years: 1.00    Types: Cigarettes  . Smokeless tobacco: Never Used     Comment: states smokes occasionally not daily.. black and milds  . Alcohol use No     Comment: occasionally..07/22/16 quit a year ago... 12-16-16  wine     Allergies:  Allergies  Allergen Reactions  . Amoxicillin Hives    Has patient had a PCN reaction causing immediate rash, facial/tongue/throat swelling, SOB or lightheadedness with hypotension: no Has patient had a PCN reaction causing severe rash involving mucus membranes or skin necrosis: unknown Has patient had a PCN reaction that required hospitalization: no Has patient had a PCN reaction occurring within the last 10 years: no If all of the above answers are "NO", then may proceed with Cephalosporin use.   . Tomato Itching and Rash    Prescriptions Prior to  Admission  Medication Sig Dispense Refill Last Dose  . flintstones complete (FLINTSTONES) 60 MG chewable tablet Chew 2 tablets by mouth daily.   05/05/2017 at Unknown time  . Prenatal Vit-Fe Fumarate-FA (PRENATAL VITAMIN) 27-0.8 MG TABS Take 1 tablet by mouth daily. (Patient not taking: Reported on 05/05/2017) 90 tablet 3 Not Taking at Unknown time    Review of Systems  Constitutional: Negative.  Negative for chills and fatigue.  Respiratory: Negative.  Negative for shortness of breath.   Cardiovascular: Negative.  Negative for chest pain.  Gastrointestinal: Positive for abdominal pain. Negative for constipation, diarrhea, nausea and vomiting.  Genitourinary: Negative.  Negative for dysuria, vaginal bleeding and vaginal discharge.  Musculoskeletal: Positive for back pain.  Neurological: Negative.  Negative for light-headedness and headaches.   Physical Exam   Blood pressure 117/70, pulse 84, temperature 98.8 F (37.1 C), temperature source Oral, resp. rate 18, height 5\' 1"  (1.549 m), weight 179 lb (81.2 kg), last menstrual period 02/26/2017.  Physical Exam  Nursing note and vitals reviewed. Constitutional: She is oriented to person, place, and time. She appears well-developed and well-nourished.  HENT:  Head: Normocephalic and atraumatic.  Eyes: Conjunctivae are normal. No scleral icterus.  Cardiovascular: Normal rate, regular rhythm and  normal heart sounds.   Respiratory: Effort normal and breath sounds normal. No respiratory distress.  GI: Soft. She exhibits no distension. There is no tenderness.  Genitourinary: Cervix exhibits discharge. Cervix exhibits no motion tenderness. No bleeding in the vagina. Vaginal discharge found.  Neurological: She is alert and oriented to person, place, and time.  Skin: Skin is warm and dry.  Psychiatric: She has a normal mood and affect. Her behavior is normal. Judgment and thought content normal.   Pelvic exam: Cervix pink, visually closed, without  lesion, moderate amount of white creamy discharge, vaginal walls and external genitalia normal Bimanual exam: Cervix 0/long/high, firm, anterior, neg CMT, uterus nontender, adnexa without tenderness, enlargement, or mass  MAU Course  Procedures Results for orders placed or performed during the hospital encounter of 05/05/17 (from the past 24 hour(s))  Urinalysis, Routine w reflex microscopic     Status: Abnormal   Collection Time: 05/05/17 12:16 PM  Result Value Ref Range   Color, Urine AMBER (A) YELLOW   APPearance CLOUDY (A) CLEAR   Specific Gravity, Urine 1.029 1.005 - 1.030   pH 5.0 5.0 - 8.0   Glucose, UA NEGATIVE NEGATIVE mg/dL   Hgb urine dipstick NEGATIVE NEGATIVE   Bilirubin Urine NEGATIVE NEGATIVE   Ketones, ur NEGATIVE NEGATIVE mg/dL   Protein, ur 30 (A) NEGATIVE mg/dL   Nitrite NEGATIVE NEGATIVE   Leukocytes, UA TRACE (A) NEGATIVE   RBC / HPF 0-5 0 - 5 RBC/hpf   WBC, UA 6-30 0 - 5 WBC/hpf   Bacteria, UA NONE SEEN NONE SEEN   Squamous Epithelial / LPF 6-30 (A) NONE SEEN   Mucous PRESENT   Wet prep, genital     Status: Abnormal   Collection Time: 05/05/17  1:20 PM  Result Value Ref Range   Yeast Wet Prep HPF POC NONE SEEN NONE SEEN   Trich, Wet Prep NONE SEEN NONE SEEN   Clue Cells Wet Prep HPF POC PRESENT (A) NONE SEEN   WBC, Wet Prep HPF POC MODERATE (A) NONE SEEN   Sperm NONE SEEN   CBC     Status: Abnormal   Collection Time: 05/05/17  1:21 PM  Result Value Ref Range   WBC 6.7 4.0 - 10.5 K/uL   RBC 3.64 (L) 3.87 - 5.11 MIL/uL   Hemoglobin 11.4 (L) 12.0 - 15.0 g/dL   HCT 31.4 (L) 36.0 - 46.0 %   MCV 86.3 78.0 - 100.0 fL   MCH 31.3 26.0 - 34.0 pg   MCHC 36.3 (H) 30.0 - 36.0 g/dL   RDW 11.7 11.5 - 15.5 %   Platelets 240 150 - 400 K/uL  ABO/Rh     Status: None (Preliminary result)   Collection Time: 05/05/17  1:21 PM  Result Value Ref Range   ABO/RH(D) O POS   hCG, quantitative, pregnancy     Status: Abnormal   Collection Time: 05/05/17  1:21 PM  Result  Value Ref Range   hCG, Beta Chain, Quant, S 133,377 (H) <5 mIU/mL   US Ob Comp Less 14 Wks  Result Date: 05/05/2017 CLINICAL DATA:  Pregnant patient with pelvic pressure for 1 day. Low back pain. EXAM: OBSTETRIC <14 WK ULTRASOUND TECHNIQUE: Transabdominal ultrasound was performed for evaluation of the gestation as well as the maternal uterus and adnexal regions. COMPARISON:  None. FINDINGS: Intrauterine gestational sac: Single Yolk sac:  Visualized. Embryo:  Visualized. Cardiac Activity: Visualized. Heart Rate: 180 bpm CRL:   23.7  mm   9 w 0  d                  [place] EDC: 12/08/2017 Subchorionic hemorrhage:  None visualized. Maternal uterus/adnexae: The right ovary measures 8.2 x 5.1 x 6.5 cm and contains a 6.7 x 5.1 x 5.1 cm mixed echogenicity predominately hyperechoic mass with associated calcification. The left ovary measures 5.8 x 3.6 x 5.7 cm and contains a 4.8 x 3.8 x 5.1 cm predominantly echogenic mass. No free fluid in the pelvis. There is a 2.1 x 1.5 x 2.0 cm fibroid within the anterior uterine body. IMPRESSION: Single live intrauterine gestation.  No subchorionic hemorrhage. 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 Electronically Signed   By: Lovey Newcomer M.D.   On: 05/05/2017 14:57   MDM UA Wet prep and gc/chlamydia CBC, HIV, quant ABO/Rh: O Pos US OB Transvaginal US OB Comp Less 14 Wks US shows 9 week IUP with heart rate of 189bpm, a 2cm fibriod and bilateral ovarian dermoids, confirmed with radiology that ovaries have adequate blood flow bilaterally Assessment and Plan   1. Abdominal cramping affecting pregnancy   2. Normal IUP (intrauterine pregnancy) on prenatal ultrasound, first trimester   3. Dermoid cyst of ovary complicating pregnancy in antepartum period, first trimester   4. Uterine fibroid in pregnancy   5. Nausea and vomiting in pregnancy    -Discharge patient home in stable condition -Discussed unisom and B6 use for  nausea -Encouraged patient to start prenatal care, plans to [facility] [place] to return here or to other Urgent Care/ED if she develops worsening of symptoms, increase in pain, fever, or other concerning symptoms.   Len Blalock SNM 05/05/2017, 3:27 PM   I confirm that I have verified the information documented in the Student midwife's note and that I have also personally reperformed the physical exam and all medical decision making activities.    Discussed Dermoids with Dr Nehemiah Settle.  Since there is flow to ovaries will watch over time May need to have them removed Discussed with patient Encouraged to return here or to other Urgent Care/ED if she develops worsening of symptoms, increase in pain, fever, or other concerning symptoms.   Seabron Spates, CNM

## 2017-05-05 NOTE — MAU Note (Signed)
Pt C/O pelvic pressure since last night, denies vaginal bleeding.  Also having lower back pain, denies dysuria.

## 2017-05-06 LAB — GC/CHLAMYDIA PROBE AMP (~~LOC~~) NOT AT ARMC
CHLAMYDIA, DNA PROBE: NEGATIVE
Neisseria Gonorrhea: NEGATIVE

## 2017-05-06 LAB — HIV ANTIBODY (ROUTINE TESTING W REFLEX): HIV SCREEN 4TH GENERATION: NONREACTIVE

## 2017-05-17 ENCOUNTER — Encounter (HOSPITAL_COMMUNITY): Payer: Self-pay | Admitting: Emergency Medicine

## 2017-05-17 ENCOUNTER — Emergency Department (HOSPITAL_COMMUNITY)
Admission: EM | Admit: 2017-05-17 | Discharge: 2017-05-17 | Discharge status: Against Medical Advice | Payer: Medicaid Other | Attending: Emergency Medicine | Admitting: Emergency Medicine

## 2017-05-17 DIAGNOSIS — Y92410 Unspecified street and highway as the place of occurrence of the external cause: Secondary | ICD-10-CM | POA: Diagnosis not present

## 2017-05-17 DIAGNOSIS — M542 Cervicalgia: Secondary | ICD-10-CM | POA: Diagnosis not present

## 2017-05-17 DIAGNOSIS — Y9389 Activity, other specified: Secondary | ICD-10-CM | POA: Insufficient documentation

## 2017-05-17 DIAGNOSIS — Y998 Other external cause status: Secondary | ICD-10-CM | POA: Insufficient documentation

## 2017-05-17 DIAGNOSIS — Z5321 Procedure and treatment not carried out due to patient leaving prior to being seen by health care provider: Secondary | ICD-10-CM | POA: Diagnosis not present

## 2017-05-17 DIAGNOSIS — M545 Low back pain: Secondary | ICD-10-CM | POA: Diagnosis not present
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
## 2017-05-17 NOTE — ED Notes (Signed)
Pt states she was leaving, encouraged to stay. States she does not want to wait any longer.
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
## 2017-05-18 ENCOUNTER — Ambulatory Visit (INDEPENDENT_AMBULATORY_CARE_PROVIDER_SITE_OTHER): Payer: Medicaid Other | Admitting: Certified Nurse Midwife

## 2017-05-18 ENCOUNTER — Other Ambulatory Visit (HOSPITAL_COMMUNITY)
Admission: RE | Admit: 2017-05-18 | Discharge: 2017-05-18 | Disposition: A | Discharge status: Home / Self Care | Payer: Medicaid Other | Source: Ambulatory Visit | Attending: Obstetrics | Admitting: Obstetrics

## 2017-05-18 ENCOUNTER — Encounter: Payer: Self-pay | Admitting: Certified Nurse Midwife

## 2017-05-18 VITALS — BP 124/82 | HR 79 | Wt 182.0 lb

## 2017-05-18 DIAGNOSIS — Z7289 Other problems related to lifestyle: Secondary | ICD-10-CM | POA: Insufficient documentation

## 2017-05-18 DIAGNOSIS — Z01419 Encounter for gynecological examination (general) (routine) without abnormal findings: Secondary | ICD-10-CM | POA: Insufficient documentation

## 2017-05-18 DIAGNOSIS — D279 Benign neoplasm of unspecified ovary: Secondary | ICD-10-CM

## 2017-05-18 DIAGNOSIS — O219 Vomiting of pregnancy, unspecified: Secondary | ICD-10-CM

## 2017-05-18 DIAGNOSIS — D259 Leiomyoma of uterus, unspecified: Secondary | ICD-10-CM

## 2017-05-18 DIAGNOSIS — Z3400 Encounter for supervision of normal first pregnancy, unspecified trimester: Secondary | ICD-10-CM | POA: Insufficient documentation

## 2017-05-18 DIAGNOSIS — Z3401 Encounter for supervision of normal first pregnancy, first trimester: Secondary | ICD-10-CM

## 2017-05-18 MED ORDER — DOXYLAMINE-PYRIDOXINE 10-10 MG PO TBEC: DELAYED_RELEASE_TABLET | ORAL | 4 refills | Status: DC

## 2017-05-18 MED ORDER — VITAFOL GUMMIES 3.33-0.333-34.8 MG PO CHEW: 3.0000 | CHEWABLE_TABLET | Freq: Every day | ORAL | 12 refills | Status: DC
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
## 2017-05-19 ENCOUNTER — Other Ambulatory Visit: Payer: Self-pay | Admitting: Certified Nurse Midwife

## 2017-05-19 DIAGNOSIS — O09899 Supervision of other high risk pregnancies, unspecified trimester: Secondary | ICD-10-CM | POA: Insufficient documentation

## 2017-05-19 DIAGNOSIS — Z2839 Other underimmunization status: Secondary | ICD-10-CM | POA: Insufficient documentation

## 2017-05-19 DIAGNOSIS — O9989 Other specified diseases and conditions complicating pregnancy, childbirth and the puerperium: Secondary | ICD-10-CM

## 2017-05-19 DIAGNOSIS — Z283 Underimmunization status: Secondary | ICD-10-CM | POA: Insufficient documentation

## 2017-05-19 LAB — OBSTETRIC PANEL, INCLUDING HIV
Antibody Screen: NEGATIVE
BASOS ABS: 0 10*3/uL (ref 0.0–0.2)
Basos: 0 %
EOS (ABSOLUTE): 0.1 10*3/uL (ref 0.0–0.4)
Eos: 1 %
HIV SCREEN 4TH GENERATION: NONREACTIVE
Hematocrit: 33.7 % — ABNORMAL LOW (ref 34.0–46.6)
Hemoglobin: 11.5 g/dL (ref 11.1–15.9)
Hepatitis B Surface Ag: NEGATIVE
Immature Grans (Abs): 0 10*3/uL (ref 0.0–0.1)
Immature Granulocytes: 0 %
LYMPHS ABS: 1.6 10*3/uL (ref 0.7–3.1)
Lymphs: 21 %
MCH: 30 pg (ref 26.6–33.0)
MCHC: 34.1 g/dL (ref 31.5–35.7)
MCV: 88 fL (ref 79–97)
Monocytes Absolute: 0.8 10*3/uL (ref 0.1–0.9)
Monocytes: 11 %
NEUTROS ABS: 5 10*3/uL (ref 1.4–7.0)
Neutrophils: 67 %
PLATELETS: 256 10*3/uL (ref 150–379)
RBC: 3.83 x10E6/uL (ref 3.77–5.28)
RDW: 13.3 % (ref 12.3–15.4)
RPR: NONREACTIVE
Rh Factor: POSITIVE
Rubella Antibodies, IGG: <0.9 index — ABNORMAL LOW (ref >0.99)
WBC: 7.5 10*3/uL (ref 3.4–10.8)

## 2017-05-19 LAB — HEMOGLOBIN A1C
Est. average glucose Bld gHb Est-mCnc: 100 mg/dL
Hgb A1c MFr Bld: 5.1 % (ref 4.8–5.6)

## 2017-05-19 LAB — VARICELLA ZOSTER ANTIBODY, IGG: Varicella zoster IgG: <135 index — ABNORMAL LOW (ref >165)

## 2017-05-20 LAB — URINE CULTURE, OB REFLEX

## 2017-05-20 LAB — CULTURE, OB URINE

## 2017-05-21 LAB — CYTOLOGY - PAP: Diagnosis: NEGATIVE

## 2017-05-22 ENCOUNTER — Other Ambulatory Visit: Payer: Self-pay | Admitting: Certified Nurse Midwife

## 2017-05-22 DIAGNOSIS — Z3400 Encounter for supervision of normal first pregnancy, unspecified trimester: Secondary | ICD-10-CM

## 2017-05-22 LAB — MATERNIT21 PLUS CORE+SCA
Chromosome 13: NEGATIVE
Chromosome 18: NEGATIVE
Chromosome 21: NEGATIVE
Y Chromosome: DETECTED

## 2017-05-27 ENCOUNTER — Other Ambulatory Visit: Payer: Self-pay | Admitting: Certified Nurse Midwife

## 2017-05-27 ENCOUNTER — Telehealth: Payer: Self-pay | Admitting: *Deleted

## 2017-05-27 DIAGNOSIS — Z3400 Encounter for supervision of normal first pregnancy, unspecified trimester: Secondary | ICD-10-CM

## 2017-05-27 NOTE — Telephone Encounter (Signed)
Patient called to get the results of her Materniti21 - and she also noticed that she has Trich on her pap.  Told patient her sex result and tolds her we would send treatment in for her. Advised she abstain until her partner is treated and we would do recheck in 4-6 weeks after treatment.

## 2017-05-29 ENCOUNTER — Other Ambulatory Visit: Payer: Self-pay | Admitting: Certified Nurse Midwife

## 2017-05-29 DIAGNOSIS — O23592 Infection of other part of genital tract in pregnancy, second trimester: Principal | ICD-10-CM

## 2017-05-29 DIAGNOSIS — A5901 Trichomonal vulvovaginitis: Secondary | ICD-10-CM

## 2017-05-29 MED ORDER — METRONIDAZOLE 500 MG PO TABS: 2000.0000 mg | ORAL_TABLET | Freq: Once | ORAL | 0 refills | Status: AC

## 2017-05-29 NOTE — Telephone Encounter (Signed)
Flagyl sent to the pharmacy.  Patient notified.  Thank you Ernst Bowler

## 2017-06-04 ENCOUNTER — Other Ambulatory Visit: Payer: Self-pay | Admitting: Certified Nurse Midwife

## 2017-06-04 DIAGNOSIS — Z3400 Encounter for supervision of normal first pregnancy, unspecified trimester: Secondary | ICD-10-CM

## 2017-06-04 LAB — INHERITEST SOCIETY GUIDED

## 2017-06-10 ENCOUNTER — Other Ambulatory Visit: Payer: Self-pay | Admitting: Certified Nurse Midwife

## 2017-06-10 ENCOUNTER — Telehealth: Payer: Self-pay

## 2017-06-10 DIAGNOSIS — O219 Vomiting of pregnancy, unspecified: Secondary | ICD-10-CM

## 2017-06-10 MED ORDER — ONDANSETRON HCL 8 MG PO TABS: 8.0000 mg | ORAL_TABLET | Freq: Three times a day (TID) | ORAL | 2 refills | Status: DC | PRN

## 2017-06-10 NOTE — Telephone Encounter (Signed)
Pt states that she is taking the diclegis, and she is not able to keep this down. Pt wouldl ike to know if you could send in something different to the pharmacy.

## 2017-06-10 NOTE — Telephone Encounter (Signed)
Pt informed

## 2017-06-10 NOTE — Telephone Encounter (Signed)
Please let her know that Zofran was sent to the pharmacy.  Thank you.Marland Kitchen  R.Veeda Virgo CNM

## 2017-06-15 ENCOUNTER — Encounter: Payer: Self-pay | Admitting: Certified Nurse Midwife

## 2017-06-15 ENCOUNTER — Ambulatory Visit (INDEPENDENT_AMBULATORY_CARE_PROVIDER_SITE_OTHER): Payer: Medicaid Other | Admitting: Certified Nurse Midwife

## 2017-06-15 VITALS — BP 128/81 | HR 90 | Wt 178.2 lb

## 2017-06-15 DIAGNOSIS — A5901 Trichomonal vulvovaginitis: Secondary | ICD-10-CM

## 2017-06-15 DIAGNOSIS — O9989 Other specified diseases and conditions complicating pregnancy, childbirth and the puerperium: Secondary | ICD-10-CM

## 2017-06-15 DIAGNOSIS — D259 Leiomyoma of uterus, unspecified: Secondary | ICD-10-CM

## 2017-06-15 DIAGNOSIS — Z283 Underimmunization status: Secondary | ICD-10-CM

## 2017-06-15 DIAGNOSIS — Z2839 Other underimmunization status: Secondary | ICD-10-CM

## 2017-06-15 DIAGNOSIS — Z3400 Encounter for supervision of normal first pregnancy, unspecified trimester: Secondary | ICD-10-CM

## 2017-06-15 DIAGNOSIS — O23592 Infection of other part of genital tract in pregnancy, second trimester: Secondary | ICD-10-CM

## 2017-06-15 DIAGNOSIS — D279 Benign neoplasm of unspecified ovary: Secondary | ICD-10-CM

## 2017-06-15 DIAGNOSIS — O09899 Supervision of other high risk pregnancies, unspecified trimester: Secondary | ICD-10-CM

## 2017-06-15 NOTE — Progress Notes (Signed)
PRENATAL VISIT NOTE  Subjective:  Deanna Valencia is a 21 y.o. G1P0 at [redacted]w[redacted]d being seen today for ongoing prenatal care.  She is currently monitored for the following issues for this low-risk pregnancy and has Borderline personality disorder; Generalized anxiety disorder; MDD (major depressive disorder), recurrent, in partial remission (HCC); PTSD (post-traumatic stress disorder); Hidradenitis axillaris; Supervision of normal first pregnancy, antepartum; Uterine fibroid; Dermoid cyst of ovary; Deliberate self-cutting; Maternal varicella, non-immune; Rubella non-immune status, antepartum; Trichomonal vaginitis during pregnancy in second trimester; and Morbid obesity (HCC) on her problem list.  Patient reports no complaints.  Contractions: Not present. Vag. Bleeding: None.   . Denies leaking of fluid.   The following portions of the patient's history were reviewed and updated as appropriate: allergies, current medications, past family history, past medical history, past social history, past surgical history and problem list. Problem list updated.  Objective:   Vitals:   06/15/17 1330  BP: 128/81  Pulse: 90  Weight: 178 lb 3.2 oz (80.8 kg)    Fetal Status: Fetal Heart Rate (bpm): 158; doppler         General:  Alert, oriented and cooperative. Patient is in no acute distress.  Skin: Skin is warm and dry. No rash noted.   Cardiovascular: Normal heart rate noted  Respiratory: Normal respiratory effort, no problems with respiration noted  Abdomen: Soft, gravid, appropriate for gestational age.  Pain/Pressure: Absent     Pelvic: Cervical exam deferred        Extremities: Normal range of motion.  Edema: None  Mental Status:  Normal mood and affect. Normal behavior. Normal judgment and thought content.   Assessment and Plan:  Pregnancy: G1P0 at [redacted]w[redacted]d  1. Rubella non-immune status, antepartum     MMR postpartum  2. Maternal varicella, non-immune      Varicella postpartum  3.  Supervision of normal first pregnancy, antepartum      Increased water intake encouraged.   - US MFM OB DETAIL +14 WK; Future - AFP, Serum, Open Spina Bifida  4. Trichomonal vaginitis during pregnancy in second trimester      TOC next ROB  5. Dermoid cyst of ovary, unspecified laterality      - US MFM OB DETAIL +14 WK; Future  6. Uterine leiomyoma, unspecified location       - US MFM OB DETAIL +14 WK; Future  7. Morbid obesity (HCC)      - US MFM OB DETAIL +14 WK; Future  Preterm labor symptoms and general obstetric precautions including but not limited to vaginal bleeding, contractions, leaking of fluid and fetal movement were reviewed in detail with the patient. Please refer to After Visit Summary for other counseling recommendations.  Return in about 4 weeks (around 07/13/2017) for ROB.    A , CNM  

## 2017-06-15 NOTE — Progress Notes (Signed)
Patient reports she may have UTI

## 2017-06-19 LAB — AFP, SERUM, OPEN SPINA BIFIDA
AFP MoM: 1.16
AFP Value: 35.4 ng/mL
GEST. AGE ON COLLECTION DATE: 15.6 wk
Maternal Age At EDD: 22.3 yr
OSBR Risk 1 IN: 10000
Test Results:: NEGATIVE
WEIGHT: 178 [lb_av]

## 2017-06-23 ENCOUNTER — Other Ambulatory Visit: Payer: Self-pay | Admitting: Certified Nurse Midwife

## 2017-06-23 DIAGNOSIS — Z3400 Encounter for supervision of normal first pregnancy, unspecified trimester: Secondary | ICD-10-CM

## 2017-07-15 ENCOUNTER — Ambulatory Visit (INDEPENDENT_AMBULATORY_CARE_PROVIDER_SITE_OTHER): Payer: Medicaid Other | Admitting: Certified Nurse Midwife

## 2017-07-15 ENCOUNTER — Encounter: Payer: Self-pay | Admitting: Certified Nurse Midwife

## 2017-07-15 VITALS — BP 131/80 | HR 88 | Wt 180.0 lb

## 2017-07-15 DIAGNOSIS — O26892 Other specified pregnancy related conditions, second trimester: Secondary | ICD-10-CM

## 2017-07-15 DIAGNOSIS — Z2839 Other underimmunization status: Secondary | ICD-10-CM

## 2017-07-15 DIAGNOSIS — Z283 Underimmunization status: Secondary | ICD-10-CM

## 2017-07-15 DIAGNOSIS — R519 Headache, unspecified: Secondary | ICD-10-CM

## 2017-07-15 DIAGNOSIS — R51 Headache: Secondary | ICD-10-CM

## 2017-07-15 DIAGNOSIS — O09899 Supervision of other high risk pregnancies, unspecified trimester: Secondary | ICD-10-CM

## 2017-07-15 DIAGNOSIS — Z3400 Encounter for supervision of normal first pregnancy, unspecified trimester: Secondary | ICD-10-CM

## 2017-07-15 DIAGNOSIS — O09892 Supervision of other high risk pregnancies, second trimester: Secondary | ICD-10-CM

## 2017-07-15 DIAGNOSIS — Z3402 Encounter for supervision of normal first pregnancy, second trimester: Secondary | ICD-10-CM

## 2017-07-15 DIAGNOSIS — O9989 Other specified diseases and conditions complicating pregnancy, childbirth and the puerperium: Secondary | ICD-10-CM

## 2017-07-15 MED ORDER — BUTALBITAL-APAP-CAFFEINE 50-325-40 MG PO TABS: 1.0000 | ORAL_TABLET | Freq: Four times a day (QID) | ORAL | 1 refills | Status: DC | PRN

## 2017-07-15 NOTE — Progress Notes (Signed)
Pt states she is having HA's/pressure x 1-2 weeks. Pt has not taken Tylenol.

## 2017-07-15 NOTE — Progress Notes (Signed)
   PRENATAL VISIT NOTE  Subjective:  Deanna Valencia is a 21 y.o. G1P0 at 61w6dbeing seen today for ongoing prenatal care.  She is currently monitored for the following issues for this low-risk pregnancy and has Borderline personality disorder; Generalized anxiety disorder; MDD (major depressive disorder), recurrent, in partial remission ([place]; PTSD (post-traumatic stress disorder); Hidradenitis axillaris; Supervision of normal first pregnancy, antepartum; Uterine fibroid; Dermoid cyst of ovary; Deliberate self-cutting; Maternal varicella, non-immune; Rubella non-immune status, antepartum; Trichomonal vaginitis during pregnancy in second trimester; and [place]bid obesity ([place] on [place] problem [place].  Patient reports headache, no bleeding, no contractions, no cramping and no leaking.  Contractions: Not present. Vag. Bleeding: None.  Movement: Present. Denies leaking of fluid.   The following portions of the patient's history were reviewed and updated as appropriate: allergies, current medications, past family history, past medical history, past social history, past surgical history and problem list. Problem list updated.  Objective:   Vitals:   07/15/17 1305  BP: 131/80  Pulse: 88  Weight: 180 lb (81.6 kg)    Fetal Status: Fetal Heart Rate (bpm): 153; doppler Fundal Height: 20 cm Movement: Present     General:  Alert, oriented and cooperative. Patient is in no acute distress.  Skin: Skin is warm and dry. No rash noted.   Cardiovascular: Normal heart rate noted  Respiratory: Normal respiratory effort, no problems with respiration noted  Abdomen: Soft, gravid, appropriate for gestational age.  Pain/Pressure: Absent     Pelvic: Cervical exam deferred        Extremities: Normal range of motion.     Mental Status:  Normal mood and affect. Normal behavior. Normal judgment and thought content.   Assessment and Plan:  Pregnancy: G1P0 at 186w6d1. Supervision of normal first pregnancy,  antepartum      Doing well.  Prenatal class information given.   2. Maternal varicella, non-immune     Varicella postpartum  3. Rubella non-immune status, antepartum     MMR postpartum  4. Morbid obesity (HCDe Pe[place]    Has lost 11 lbs this pregnancy  5. Pregnancy headache in second trimester     OTC tylenol encouraged first.  - butalbital-acetaminophen-caffeine (FIORICET, ESGIC) 50-325-40 MG tablet; Take 1-2 tablets by mouth every 6 (six) hours as needed.  Dispense: 45 tablet; Refill: 1  Preterm labor symptoms and general obstetric precautions including but not limited to vaginal bleeding, contractions, leaking of fluid and fetal movement were reviewed in detail with the patient. Please refer to After Visit Summary for other counseling recommendations.  Return in about 4 weeks (around 08/12/2017) for ROB.   RaMorene CrockerCNM

## 2017-07-16 ENCOUNTER — Ambulatory Visit (HOSPITAL_COMMUNITY): Payer: No Typology Code available for payment source

## 2017-07-16 ENCOUNTER — Other Ambulatory Visit (HOSPITAL_COMMUNITY): Payer: Self-pay | Admitting: *Deleted

## 2017-07-16 ENCOUNTER — Encounter (HOSPITAL_COMMUNITY): Payer: Self-pay

## 2017-07-16 ENCOUNTER — Ambulatory Visit (HOSPITAL_COMMUNITY)
Admission: RE | Admit: 2017-07-16 | Discharge: 2017-07-16 | Disposition: A | Discharge status: Home / Self Care | Payer: Medicaid Other | Source: Ambulatory Visit | Attending: Certified Nurse Midwife | Admitting: Certified Nurse Midwife

## 2017-07-16 DIAGNOSIS — O99342 Other mental disorders complicating pregnancy, second trimester: Secondary | ICD-10-CM | POA: Insufficient documentation

## 2017-07-16 DIAGNOSIS — Z3689 Encounter for other specified antenatal screening: Secondary | ICD-10-CM | POA: Diagnosis present

## 2017-07-16 DIAGNOSIS — Z0489 Encounter for examination and observation for other specified reasons: Secondary | ICD-10-CM

## 2017-07-16 DIAGNOSIS — IMO0002 Reserved for concepts with insufficient information to code with codable children: Secondary | ICD-10-CM

## 2017-07-16 DIAGNOSIS — Z3A20 20 weeks gestation of pregnancy: Secondary | ICD-10-CM | POA: Insufficient documentation

## 2017-07-16 DIAGNOSIS — O99212 Obesity complicating pregnancy, second trimester: Secondary | ICD-10-CM | POA: Insufficient documentation

## 2017-07-16 DIAGNOSIS — D279 Benign neoplasm of unspecified ovary: Secondary | ICD-10-CM

## 2017-07-16 DIAGNOSIS — Z3400 Encounter for supervision of normal first pregnancy, unspecified trimester: Secondary | ICD-10-CM

## 2017-07-16 DIAGNOSIS — D259 Leiomyoma of uterus, unspecified: Secondary | ICD-10-CM

## 2017-07-25 ENCOUNTER — Other Ambulatory Visit: Payer: Self-pay | Admitting: Certified Nurse Midwife

## 2017-07-25 DIAGNOSIS — Z3400 Encounter for supervision of normal first pregnancy, unspecified trimester: Secondary | ICD-10-CM

## 2017-08-12 ENCOUNTER — Other Ambulatory Visit (HOSPITAL_COMMUNITY)
Admission: RE | Admit: 2017-08-12 | Discharge: 2017-08-12 | Disposition: A | Discharge status: Home / Self Care | Payer: Medicaid Other | Source: Ambulatory Visit | Attending: Certified Nurse Midwife | Admitting: Certified Nurse Midwife

## 2017-08-12 ENCOUNTER — Ambulatory Visit (INDEPENDENT_AMBULATORY_CARE_PROVIDER_SITE_OTHER): Payer: Medicaid Other | Admitting: Certified Nurse Midwife

## 2017-08-12 VITALS — BP 136/84 | HR 98 | Wt 182.9 lb

## 2017-08-12 DIAGNOSIS — Z3A23 23 weeks gestation of pregnancy: Secondary | ICD-10-CM | POA: Diagnosis not present

## 2017-08-12 DIAGNOSIS — F411 Generalized anxiety disorder: Secondary | ICD-10-CM

## 2017-08-12 DIAGNOSIS — Z3402 Encounter for supervision of normal first pregnancy, second trimester: Secondary | ICD-10-CM

## 2017-08-12 DIAGNOSIS — O23592 Infection of other part of genital tract in pregnancy, second trimester: Secondary | ICD-10-CM

## 2017-08-12 DIAGNOSIS — Z283 Underimmunization status: Secondary | ICD-10-CM

## 2017-08-12 DIAGNOSIS — Z3400 Encounter for supervision of normal first pregnancy, unspecified trimester: Secondary | ICD-10-CM | POA: Diagnosis present

## 2017-08-12 DIAGNOSIS — O09892 Supervision of other high risk pregnancies, second trimester: Secondary | ICD-10-CM

## 2017-08-12 DIAGNOSIS — O9989 Other specified diseases and conditions complicating pregnancy, childbirth and the puerperium: Secondary | ICD-10-CM

## 2017-08-12 DIAGNOSIS — A5901 Trichomonal vulvovaginitis: Secondary | ICD-10-CM | POA: Diagnosis not present

## 2017-08-12 DIAGNOSIS — O09899 Supervision of other high risk pregnancies, unspecified trimester: Secondary | ICD-10-CM

## 2017-08-12 NOTE — Progress Notes (Signed)
   PRENATAL VISIT NOTE  Subjective:  Deanna Valencia is a 21 y.o. G1P0 at 34w6dbeing seen today for ongoing prenatal care.  She is currently monitored for the following issues for this low-risk pregnancy and has Borderline personality disorder ([place]; Generalized anxiety disorder; MDD (major depressive disorder), recurrent, in partial remission ([place]; PTSD (post-traumatic stress disorder); Hidradenitis axillaris; Supervision of normal first pregnancy, antepartum; Uterine fibroid; Dermoid cyst of ovary; Deliberate self-cutting; Maternal varicella, non-immune; Rubella non-immune status, antepartum; Trichomonal vaginitis during pregnancy in second trimester; and Morbid obesity ([place] on her problem list.  Patient reports no complaints.  Contractions: Not present. Vag. Bleeding: None.  Movement: Present. Denies leaking of fluid.   The following portions of the patient's history were reviewed and updated as appropriate: allergies, current medications, past family history, past medical history, past social history, past surgical history and problem list. Problem list updated.  Objective:   Vitals:   08/12/17 1353  BP: 136/84  Pulse: 98  Weight: 182 lb 14.4 oz (83 kg)    Fetal Status:     Movement: Present     General:  Alert, oriented and cooperative. Patient is in no acute distress.  Skin: Skin is warm and dry. No rash noted.   Cardiovascular: Normal heart rate noted  Respiratory: Normal respiratory effort, no problems with respiration noted  Abdomen: Soft, gravid, appropriate for gestational age.  Pain/Pressure: Absent     Pelvic: Cervical exam deferred        Extremities: Normal range of motion.  Edema: None  Mental Status:  Normal mood and affect. Normal behavior. Normal judgment and thought content.   Assessment and Plan:  Pregnancy: G1P0 at 242w6d1. Supervision of normal first pregnancy, antepartum      Doing well.    2. Maternal varicella, non-immune     Varicella  postpartum  3. Rubella non-immune status, antepartum      MMR postpartum  4. Generalized anxiety disorder     Stable, no meds.  Happy today.    5. Trichomonal vaginitis during pregnancy in second trimester      TOC today  Preterm labor symptoms and general obstetric precautions including but not limited to vaginal bleeding, contractions, leaking of fluid and fetal movement were reviewed in detail with the patient. Please refer to After Visit Summary for other counseling recommendations.  Return in about 3 weeks (around 09/02/2017) for ROB, 2 hr OGTT.   RaMorene CrockerCNM

## 2017-08-13 ENCOUNTER — Ambulatory Visit (HOSPITAL_COMMUNITY)
Admission: RE | Admit: 2017-08-13 | Discharge: 2017-08-13 | Disposition: A | Discharge status: Home / Self Care | Payer: Medicaid Other | Source: Ambulatory Visit | Attending: Certified Nurse Midwife | Admitting: Certified Nurse Midwife

## 2017-08-13 ENCOUNTER — Other Ambulatory Visit (HOSPITAL_COMMUNITY): Payer: Self-pay | Admitting: Obstetrics and Gynecology

## 2017-08-13 ENCOUNTER — Encounter (HOSPITAL_COMMUNITY): Payer: Self-pay

## 2017-08-13 DIAGNOSIS — IMO0002 Reserved for concepts with insufficient information to code with codable children: Secondary | ICD-10-CM

## 2017-08-13 DIAGNOSIS — Z3A24 24 weeks gestation of pregnancy: Secondary | ICD-10-CM

## 2017-08-13 DIAGNOSIS — O99342 Other mental disorders complicating pregnancy, second trimester: Secondary | ICD-10-CM | POA: Diagnosis not present

## 2017-08-13 DIAGNOSIS — Z0489 Encounter for examination and observation for other specified reasons: Secondary | ICD-10-CM

## 2017-08-13 DIAGNOSIS — D259 Leiomyoma of uterus, unspecified: Secondary | ICD-10-CM

## 2017-08-13 DIAGNOSIS — O283 Abnormal ultrasonic finding on antenatal screening of mother: Secondary | ICD-10-CM | POA: Insufficient documentation

## 2017-08-13 DIAGNOSIS — O99210 Obesity complicating pregnancy, unspecified trimester: Secondary | ICD-10-CM

## 2017-08-13 DIAGNOSIS — Z362 Encounter for other antenatal screening follow-up: Secondary | ICD-10-CM | POA: Diagnosis not present

## 2017-08-13 DIAGNOSIS — O99212 Obesity complicating pregnancy, second trimester: Secondary | ICD-10-CM | POA: Diagnosis not present

## 2017-08-13 DIAGNOSIS — O3412 Maternal care for benign tumor of corpus uteri, second trimester: Secondary | ICD-10-CM

## 2017-08-13 LAB — URINE CYTOLOGY ANCILLARY ONLY
CHLAMYDIA, DNA PROBE: NEGATIVE
NEISSERIA GONORRHEA: NEGATIVE
TRICH (WINDOWPATH): NEGATIVE

## 2017-08-14 ENCOUNTER — Other Ambulatory Visit (HOSPITAL_COMMUNITY): Payer: Self-pay | Admitting: *Deleted

## 2017-08-14 DIAGNOSIS — O35EXX0 Maternal care for other (suspected) fetal abnormality and damage, fetal genitourinary anomalies, not applicable or unspecified: Secondary | ICD-10-CM

## 2017-08-14 DIAGNOSIS — O358XX0 Maternal care for other (suspected) fetal abnormality and damage, not applicable or unspecified: Secondary | ICD-10-CM

## 2017-09-02 ENCOUNTER — Ambulatory Visit (INDEPENDENT_AMBULATORY_CARE_PROVIDER_SITE_OTHER): Payer: Medicaid Other | Admitting: Certified Nurse Midwife

## 2017-09-02 ENCOUNTER — Other Ambulatory Visit: Payer: Medicaid Other

## 2017-09-02 VITALS — BP 133/76 | HR 81 | Wt 190.6 lb

## 2017-09-02 DIAGNOSIS — O09899 Supervision of other high risk pregnancies, unspecified trimester: Secondary | ICD-10-CM

## 2017-09-02 DIAGNOSIS — Z3403 Encounter for supervision of normal first pregnancy, third trimester: Secondary | ICD-10-CM

## 2017-09-02 DIAGNOSIS — Z283 Underimmunization status: Secondary | ICD-10-CM

## 2017-09-02 DIAGNOSIS — O09893 Supervision of other high risk pregnancies, third trimester: Secondary | ICD-10-CM

## 2017-09-02 DIAGNOSIS — Z3400 Encounter for supervision of normal first pregnancy, unspecified trimester: Secondary | ICD-10-CM

## 2017-09-02 DIAGNOSIS — O9989 Other specified diseases and conditions complicating pregnancy, childbirth and the puerperium: Secondary | ICD-10-CM

## 2017-09-02 NOTE — Progress Notes (Signed)
Patient reports good fetal movement, denies pain. 

## 2017-09-02 NOTE — Progress Notes (Signed)
   PRENATAL VISIT NOTE  Subjective:  Deanna Valencia is a 21 y.o. G1P0 at 92w6dbeing seen today for ongoing prenatal care.  She is currently monitored for the following issues for this low-risk pregnancy and has Borderline personality disorder (HO[place]; Generalized anxiety disorder; MDD (major depressive disorder), recurrent, in partial remission ([place]; PTSD (post-traumatic stress disorder); Hidradenitis axillaris; Supervision of normal first pregnancy, antepartum; Uterine fibroid; Dermoid cyst of ovary; Deliberate self-cutting; Maternal varicella, non-immune; Rubella non-immune status, antepartum; Trichomonal vaginitis during pregnancy in second trimester; and Morbid obesity (HF[place] on her problem list.  Patient reports backache, no bleeding, no contractions, no cramping and no leaking.  Contractions: Not present. Vag. Bleeding: None.  Movement: Present. Denies leaking of fluid.   The following portions of the patient's history were reviewed and updated as appropriate: allergies, current medications, past family history, past medical history, past social history, past surgical history and problem list. Problem list updated.  Objective:   Vitals:   09/02/17 0823  BP: 133/76  Pulse: 81  Weight: 190 lb 9.6 oz (86.5 kg)    Fetal Status: Fetal Heart Rate (bpm): 151; doppler Fundal Height: 28 cm Movement: Present     General:  Alert, oriented and cooperative. Patient is in no acute distress.  Skin: Skin is warm and dry. No rash noted.   Cardiovascular: Normal heart rate noted  Respiratory: Normal respiratory effort, no problems with respiration noted  Abdomen: Soft, gravid, appropriate for gestational age.  Pain/Pressure: Absent     Pelvic: Cervical exam deferred        Extremities: Normal range of motion.  Edema: None  Mental Status:  Normal mood and affect. Normal behavior. Normal judgment and thought content.   Assessment and Plan:  Pregnancy: G1P0 at 263w6d1. Supervision of normal  first pregnancy, antepartum     Doing well.  Homeopathic remedies for back pain discussed.   - Glucose Tolerance, 2 Hours w/1 Hour - RPR - CBC - HIV antibody (with reflex)  2. Rubella non-immune status, antepartum     MMR postpartum  3. Maternal varicella, non-immune     Varicella postpartum  Preterm labor symptoms and general obstetric precautions including but not limited to vaginal bleeding, contractions, leaking of fluid and fetal movement were reviewed in detail with the patient. Please refer to After Visit Summary for other counseling recommendations.  Return in about 2 weeks (around 09/16/2017) for ROB, TDaP/Influenza vac.   RaMorene CrockerCNM

## 2017-09-03 ENCOUNTER — Other Ambulatory Visit: Payer: Self-pay | Admitting: Certified Nurse Midwife

## 2017-09-03 DIAGNOSIS — Z3400 Encounter for supervision of normal first pregnancy, unspecified trimester: Secondary | ICD-10-CM

## 2017-09-03 DIAGNOSIS — O99013 Anemia complicating pregnancy, third trimester: Secondary | ICD-10-CM

## 2017-09-03 LAB — CBC
Hematocrit: 29.9 % — ABNORMAL LOW (ref 34.0–46.6)
Hemoglobin: 10.3 g/dL — ABNORMAL LOW (ref 11.1–15.9)
MCH: 31.5 pg (ref 26.6–33.0)
MCHC: 34.4 g/dL (ref 31.5–35.7)
MCV: 91 fL (ref 79–97)
Platelets: 255 x10E3/uL (ref 150–379)
RBC: 3.27 x10E6/uL — ABNORMAL LOW (ref 3.77–5.28)
RDW: 13.8 % (ref 12.3–15.4)
WBC: 8.1 x10E3/uL (ref 3.4–10.8)

## 2017-09-03 LAB — GLUCOSE TOLERANCE, 2 HOURS W/ 1HR
Glucose, 1 hour: 84 mg/dL (ref 65–179)
Glucose, 2 hour: 84 mg/dL (ref 65–152)
Glucose, Fasting: 70 mg/dL (ref 65–91)

## 2017-09-03 LAB — SYPHILIS: RPR W/REFLEX TO RPR TITER AND TREPONEMAL ANTIBODIES, TRADITIONAL SCREENING AND DIAGNOSIS ALGORITHM: RPR Ser Ql: NONREACTIVE

## 2017-09-03 LAB — HIV ANTIBODY (ROUTINE TESTING W REFLEX): HIV Screen 4th Generation wRfx: NONREACTIVE

## 2017-09-03 MED ORDER — CITRANATAL BLOOM 90-1 MG PO TABS: 1.0000 | ORAL_TABLET | Freq: Every day | ORAL | 12 refills | Status: DC

## 2017-09-16 ENCOUNTER — Ambulatory Visit (INDEPENDENT_AMBULATORY_CARE_PROVIDER_SITE_OTHER): Payer: Medicaid Other | Admitting: Certified Nurse Midwife

## 2017-09-16 VITALS — BP 139/85 | HR 91 | Wt 192.2 lb

## 2017-09-16 DIAGNOSIS — O99013 Anemia complicating pregnancy, third trimester: Secondary | ICD-10-CM

## 2017-09-16 DIAGNOSIS — Z283 Underimmunization status: Secondary | ICD-10-CM

## 2017-09-16 DIAGNOSIS — O09893 Supervision of other high risk pregnancies, third trimester: Secondary | ICD-10-CM

## 2017-09-16 DIAGNOSIS — Z3400 Encounter for supervision of normal first pregnancy, unspecified trimester: Secondary | ICD-10-CM

## 2017-09-16 DIAGNOSIS — O09899 Supervision of other high risk pregnancies, unspecified trimester: Secondary | ICD-10-CM

## 2017-09-16 DIAGNOSIS — D649 Anemia, unspecified: Secondary | ICD-10-CM

## 2017-09-16 NOTE — Progress Notes (Signed)
Patient reports good fetal movement, denies pain. 

## 2017-09-16 NOTE — Progress Notes (Signed)
   PRENATAL VISIT NOTE  Subjective:  Deanna Valencia is a 21 y.o. G1P0 at [redacted]w[redacted]d being seen today for ongoing prenatal care.  She is currently monitored for the following issues for this low-risk pregnancy and has Borderline personality disorder ([place]); Generalized anxiety disorder; MDD (major depressive disorder), recurrent, in partial remission ([place]); PTSD (post-traumatic stress disorder); Hidradenitis axillaris; Supervision of normal first pregnancy, antepartum; Uterine fibroid; Dermoid cyst of ovary; Deliberate self-cutting; Maternal varicella, non-immune; Rubella non-immune status, antepartum; Trichomonal vaginitis during pregnancy in second trimester; Morbid obesity ([place]); and Anemia in pregnancy, third trimester on their problem list.  Patient reports no complaints.  Contractions: Not present. Vag. Bleeding: None.  Movement: Present. Denies leaking of fluid.   The following portions of the patient's history were reviewed and updated as appropriate: allergies, current medications, past family history, past medical history, past social history, past surgical history and problem list. Problem list updated.  Objective:   Vitals:   09/16/17 1354  BP: 139/85  Pulse: 91  Weight: 192 lb 3.2 oz (87.2 kg)    Fetal Status: Fetal Heart Rate (bpm): 156; doppler Fundal Height: 29 cm Movement: Present     General:  Alert, oriented and cooperative. Patient is in no acute distress.  Skin: Skin is warm and dry. No rash noted.   Cardiovascular: Normal heart rate noted  Respiratory: Normal respiratory effort, no problems with respiration noted  Abdomen: Soft, gravid, appropriate for gestational age.  Pain/Pressure: Absent     Pelvic: Cervical exam deferred        Extremities: Normal range of motion.  Edema: None  Mental Status:  Normal mood and affect. Normal behavior. Normal judgment and thought content.   Assessment and Plan:  Pregnancy: G1P0 at [redacted]w[redacted]d  1. Supervision of normal first pregnancy,  antepartum      Doing well.    2. Maternal varicella, non-immune     Varicella postpartum  3. Anemia in pregnancy, third trimester     Taking Bloom  Preterm labor symptoms and general obstetric precautions including but not limited to vaginal bleeding, contractions, leaking of fluid and fetal movement were reviewed in detail with the patient. Please refer to After Visit Summary for other counseling recommendations.  Return in about 2 weeks (around 09/30/2017) for ROB.   Morene Crocker, CNM

## 2017-09-24 ENCOUNTER — Other Ambulatory Visit (HOSPITAL_COMMUNITY): Payer: Self-pay | Admitting: Maternal and Fetal Medicine

## 2017-09-24 ENCOUNTER — Encounter (HOSPITAL_COMMUNITY): Payer: Self-pay

## 2017-09-24 ENCOUNTER — Ambulatory Visit (HOSPITAL_COMMUNITY)
Admission: RE | Admit: 2017-09-24 | Discharge: 2017-09-24 | Disposition: A | Discharge status: Home / Self Care | Payer: Medicaid Other | Source: Ambulatory Visit | Attending: Certified Nurse Midwife | Admitting: Certified Nurse Midwife

## 2017-09-24 DIAGNOSIS — O99343 Other mental disorders complicating pregnancy, third trimester: Secondary | ICD-10-CM | POA: Insufficient documentation

## 2017-09-24 DIAGNOSIS — Z362 Encounter for other antenatal screening follow-up: Secondary | ICD-10-CM | POA: Diagnosis present

## 2017-09-24 DIAGNOSIS — O358XX0 Maternal care for other (suspected) fetal abnormality and damage, not applicable or unspecified: Secondary | ICD-10-CM

## 2017-09-24 DIAGNOSIS — O99213 Obesity complicating pregnancy, third trimester: Secondary | ICD-10-CM | POA: Insufficient documentation

## 2017-09-24 DIAGNOSIS — Z6837 Body mass index (BMI) 37.0-37.9, adult: Secondary | ICD-10-CM | POA: Diagnosis not present

## 2017-09-24 DIAGNOSIS — Z3A30 30 weeks gestation of pregnancy: Secondary | ICD-10-CM | POA: Diagnosis not present

## 2017-09-24 DIAGNOSIS — E669 Obesity, unspecified: Secondary | ICD-10-CM | POA: Insufficient documentation

## 2017-09-24 DIAGNOSIS — O35EXX0 Maternal care for other (suspected) fetal abnormality and damage, fetal genitourinary anomalies, not applicable or unspecified: Secondary | ICD-10-CM

## 2017-09-25 ENCOUNTER — Other Ambulatory Visit (HOSPITAL_COMMUNITY): Payer: Self-pay | Admitting: *Deleted

## 2017-09-25 DIAGNOSIS — O139 Gestational [pregnancy-induced] hypertension without significant proteinuria, unspecified trimester: Secondary | ICD-10-CM

## 2017-09-29 ENCOUNTER — Other Ambulatory Visit: Payer: Self-pay

## 2017-09-29 ENCOUNTER — Ambulatory Visit (INDEPENDENT_AMBULATORY_CARE_PROVIDER_SITE_OTHER): Payer: Medicaid Other | Admitting: Certified Nurse Midwife

## 2017-09-29 ENCOUNTER — Encounter: Payer: Self-pay | Admitting: Certified Nurse Midwife

## 2017-09-29 VITALS — BP 131/82 | HR 79 | Wt 195.7 lb

## 2017-09-29 DIAGNOSIS — O9989 Other specified diseases and conditions complicating pregnancy, childbirth and the puerperium: Secondary | ICD-10-CM

## 2017-09-29 DIAGNOSIS — O99211 Obesity complicating pregnancy, first trimester: Secondary | ICD-10-CM

## 2017-09-29 DIAGNOSIS — O09899 Supervision of other high risk pregnancies, unspecified trimester: Secondary | ICD-10-CM

## 2017-09-29 DIAGNOSIS — O99013 Anemia complicating pregnancy, third trimester: Secondary | ICD-10-CM

## 2017-09-29 DIAGNOSIS — Z283 Underimmunization status: Secondary | ICD-10-CM

## 2017-09-29 DIAGNOSIS — D649 Anemia, unspecified: Secondary | ICD-10-CM

## 2017-09-29 DIAGNOSIS — Z3400 Encounter for supervision of normal first pregnancy, unspecified trimester: Secondary | ICD-10-CM

## 2017-09-29 NOTE — Progress Notes (Signed)
   PRENATAL VISIT NOTE  Subjective:  Deanna Valencia is a 21 y.o. G1P0 at 53w5dbeing seen today for ongoing prenatal care.  She is currently monitored for the following issues for this low-risk pregnancy and has Borderline personality disorder ([place]; Generalized anxiety disorder; MDD (major depressive disorder), recurrent, in partial remission (HK[place]; PTSD (post-traumatic stress disorder); Hidradenitis axillaris; Supervision of normal first pregnancy, antepartum; Uterine fibroid; Dermoid cyst of ovary; Deliberate self-cutting; Maternal varicella, non-immune; Rubella non-immune status, antepartum; Trichomonal vaginitis during pregnancy in second trimester; Morbid obesity (HJoppatow[place]; and Anemia in pregnancy, third trimester on their problem list.  Patient reports no complaints.  Contractions: Not present. Vag. Bleeding: None, Scant.  Movement: Present. Denies leaking of fluid.   The following portions of the patient's history were reviewed and updated as appropriate: allergies, current medications, past family history, past medical history, past social history, past surgical history and problem list. Problem list updated.  Objective:   Vitals:   09/29/17 1531  BP: 131/82  Pulse: 79  Weight: 195 lb 11.2 oz (88.8 kg)    Fetal Status: Fetal Heart Rate (bpm): 158; doppler Fundal Height: 32 cm Movement: Present     General:  Alert, oriented and cooperative. Patient is in no acute distress.  Skin: Skin is warm and dry. No rash noted.   Cardiovascular: Normal heart rate noted  Respiratory: Normal respiratory effort, no problems with respiration noted  Abdomen: Soft, gravid, appropriate for gestational age.  Pain/Pressure: Present     Pelvic: Cervical exam deferred        Extremities: Normal range of motion.  Edema: None  Mental Status:  Normal mood and affect. Normal behavior. Normal judgment and thought content.   Assessment and Plan:  Pregnancy: G1P0 at 362w5d1. Supervision of normal first  pregnancy, antepartum     Doing well.   2. Rubella non-immune status, antepartum     MMR postpartum  3. Morbid obesity (HCMill[place]    4 lb weight gain this pregnancy  4. Anemia in pregnancy, third trimester     Taking Bloom.   Preterm labor symptoms and general obstetric precautions including but not limited to vaginal bleeding, contractions, leaking of fluid and fetal movement were reviewed in detail with the patient. Please refer to After Visit Summary for other counseling recommendations.  Return in about 2 weeks (around 10/13/2017) for ROB.   RaMorene CrockerCNM

## 2017-10-13 ENCOUNTER — Ambulatory Visit (INDEPENDENT_AMBULATORY_CARE_PROVIDER_SITE_OTHER): Payer: Medicaid Other | Admitting: Certified Nurse Midwife

## 2017-10-13 VITALS — BP 133/77 | HR 100 | Wt 201.2 lb

## 2017-10-13 DIAGNOSIS — Z3400 Encounter for supervision of normal first pregnancy, unspecified trimester: Secondary | ICD-10-CM

## 2017-10-13 DIAGNOSIS — Z283 Underimmunization status: Secondary | ICD-10-CM

## 2017-10-13 DIAGNOSIS — O9989 Other specified diseases and conditions complicating pregnancy, childbirth and the puerperium: Principal | ICD-10-CM

## 2017-10-13 DIAGNOSIS — O09893 Supervision of other high risk pregnancies, third trimester: Secondary | ICD-10-CM

## 2017-10-13 DIAGNOSIS — O99213 Obesity complicating pregnancy, third trimester: Secondary | ICD-10-CM

## 2017-10-13 DIAGNOSIS — O09899 Supervision of other high risk pregnancies, unspecified trimester: Secondary | ICD-10-CM

## 2017-10-13 NOTE — Progress Notes (Signed)
   PRENATAL VISIT NOTE  Subjective:  Deanna Valencia is a 21 y.o. G1P0 at 76w5dbeing seen today for ongoing prenatal care.  She is currently monitored for the following issues for this low-risk pregnancy and has Borderline personality disorder (HPal[place]; Generalized anxiety disorder; MDD (major depressive disorder), recurrent, in partial remission (HSt. [place]; PTSD (post-traumatic stress disorder); Hidradenitis axillaris; Supervision of normal first pregnancy, antepartum; Uterine fibroid; Dermoid cyst of ovary; Deliberate self-cutting; Maternal varicella, non-immune; Rubella non-immune status, antepartum; Trichomonal vaginitis during pregnancy in second trimester; Morbid obesity (HS[place]; and Anemia in pregnancy, third trimester on their problem list.  Patient reports no complaints.  Contractions: Not present. Vag. Bleeding: None.  Movement: Present. Denies leaking of fluid.   The following portions of the patient's history were reviewed and updated as appropriate: allergies, current medications, past family history, past medical history, past social history, past surgical history and problem list. Problem list updated.  Objective:   Vitals:   10/13/17 1600  BP: 133/77  Pulse: 100  Weight: 201 lb 3.2 oz (91.3 kg)    Fetal Status: Fetal Heart Rate (bpm): 143; soppler Fundal Height: 33 cm Movement: Present     General:  Alert, oriented and cooperative. Patient is in no acute distress.  Skin: Skin is warm and dry. No rash noted.   Cardiovascular: Normal heart rate noted  Respiratory: Normal respiratory effort, no problems with respiration noted  Abdomen: Soft, gravid, appropriate for gestational age.  Pain/Pressure: Present     Pelvic: Cervical exam deferred        Extremities: Normal range of motion.  Edema: None  Mental Status:  Normal mood and affect. Normal behavior. Normal judgment and thought content.   Assessment and Plan:  Pregnancy: G1P0 at 326w5d1. Supervision of normal first  pregnancy, antepartum     Doing well  2. Rubella non-immune status, antepartum     MMR postpartum  3. Morbid obesity (HCPerry Hall   10 lb weight gain this pregnancy  4. Maternal varicella, non-immune     Varicella postpartum  Preterm labor symptoms and general obstetric precautions including but not limited to vaginal bleeding, contractions, leaking of fluid and fetal movement were reviewed in detail with the patient. Please refer to After Visit Summary for other counseling recommendations.  Return in about 2 weeks (around 10/27/2017) for ROB.   RaMorene CrockerCNM

## 2017-10-13 NOTE — Progress Notes (Signed)
Patient reports good fetal movement with some pressure, denies contractions.

## 2017-10-15 ENCOUNTER — Other Ambulatory Visit (HOSPITAL_COMMUNITY): Payer: Self-pay | Admitting: Maternal and Fetal Medicine

## 2017-10-15 ENCOUNTER — Encounter (HOSPITAL_COMMUNITY): Payer: Self-pay

## 2017-10-15 ENCOUNTER — Ambulatory Visit (HOSPITAL_COMMUNITY)
Admission: RE | Admit: 2017-10-15 | Discharge: 2017-10-15 | Disposition: A | Discharge status: Home / Self Care | Payer: Medicaid Other | Source: Ambulatory Visit | Attending: Certified Nurse Midwife | Admitting: Certified Nurse Midwife

## 2017-10-15 DIAGNOSIS — O99213 Obesity complicating pregnancy, third trimester: Secondary | ICD-10-CM | POA: Diagnosis not present

## 2017-10-15 DIAGNOSIS — O99343 Other mental disorders complicating pregnancy, third trimester: Secondary | ICD-10-CM | POA: Insufficient documentation

## 2017-10-15 DIAGNOSIS — Z3A33 33 weeks gestation of pregnancy: Secondary | ICD-10-CM | POA: Insufficient documentation

## 2017-10-15 DIAGNOSIS — O133 Gestational [pregnancy-induced] hypertension without significant proteinuria, third trimester: Secondary | ICD-10-CM | POA: Insufficient documentation

## 2017-10-15 DIAGNOSIS — O139 Gestational [pregnancy-induced] hypertension without significant proteinuria, unspecified trimester: Secondary | ICD-10-CM

## 2017-10-15 DIAGNOSIS — Z362 Encounter for other antenatal screening follow-up: Secondary | ICD-10-CM

## 2017-10-27 ENCOUNTER — Encounter: Payer: Self-pay | Admitting: *Deleted

## 2017-10-27 ENCOUNTER — Encounter: Payer: Self-pay | Admitting: Certified Nurse Midwife

## 2017-10-27 ENCOUNTER — Ambulatory Visit (INDEPENDENT_AMBULATORY_CARE_PROVIDER_SITE_OTHER): Payer: Medicaid Other | Admitting: Certified Nurse Midwife

## 2017-10-27 ENCOUNTER — Inpatient Hospital Stay (HOSPITAL_COMMUNITY)
Admission: AD | Admit: 2017-10-27 | Discharge: 2017-10-27 | Discharge status: Absent without leave | Payer: Medicaid Other | Source: Ambulatory Visit | Attending: Obstetrics and Gynecology | Admitting: Obstetrics and Gynecology

## 2017-10-27 ENCOUNTER — Ambulatory Visit (HOSPITAL_COMMUNITY)
Admission: RE | Admit: 2017-10-27 | Discharge: 2017-10-27 | Disposition: A | Discharge status: Home / Self Care | Payer: Medicaid Other | Source: Ambulatory Visit | Attending: Certified Nurse Midwife | Admitting: Certified Nurse Midwife

## 2017-10-27 VITALS — BP 131/84 | HR 67 | Wt 199.2 lb

## 2017-10-27 DIAGNOSIS — Z3403 Encounter for supervision of normal first pregnancy, third trimester: Secondary | ICD-10-CM | POA: Diagnosis not present

## 2017-10-27 DIAGNOSIS — E669 Obesity, unspecified: Secondary | ICD-10-CM | POA: Diagnosis not present

## 2017-10-27 DIAGNOSIS — O99213 Obesity complicating pregnancy, third trimester: Secondary | ICD-10-CM | POA: Diagnosis not present

## 2017-10-27 DIAGNOSIS — O133 Gestational [pregnancy-induced] hypertension without significant proteinuria, third trimester: Secondary | ICD-10-CM

## 2017-10-27 DIAGNOSIS — O9989 Other specified diseases and conditions complicating pregnancy, childbirth and the puerperium: Secondary | ICD-10-CM

## 2017-10-27 DIAGNOSIS — O99343 Other mental disorders complicating pregnancy, third trimester: Secondary | ICD-10-CM | POA: Insufficient documentation

## 2017-10-27 DIAGNOSIS — Z283 Underimmunization status: Secondary | ICD-10-CM

## 2017-10-27 DIAGNOSIS — O288 Other abnormal findings on antenatal screening of mother: Secondary | ICD-10-CM | POA: Insufficient documentation

## 2017-10-27 DIAGNOSIS — Z362 Encounter for other antenatal screening follow-up: Secondary | ICD-10-CM | POA: Insufficient documentation

## 2017-10-27 DIAGNOSIS — Z3A34 34 weeks gestation of pregnancy: Secondary | ICD-10-CM | POA: Diagnosis not present

## 2017-10-27 DIAGNOSIS — Z3400 Encounter for supervision of normal first pregnancy, unspecified trimester: Secondary | ICD-10-CM

## 2017-10-27 DIAGNOSIS — Z2839 Other underimmunization status: Secondary | ICD-10-CM

## 2017-10-27 NOTE — Progress Notes (Signed)
PRENATAL VISIT NOTE  Subjective:  Deanna Valencia is a 21 y.o. G1P0 at 60w5dbeing seen today for ongoing prenatal care.  She is currently monitored for the following issues for this high-risk pregnancy and has Borderline personality disorder (HP[place]; Generalized anxiety disorder; MDD (major depressive disorder), recurrent, in partial remission (HCrow[place]; PTSD (post-traumatic stress disorder); Hidradenitis axillaris; Supervision of normal first pregnancy, antepartum; Uterine fibroid; Dermoid cyst of ovary; Deliberate self-cutting; Maternal varicella, non-immune; Rubella non-immune status, antepartum; Trichomonal vaginitis during pregnancy in second trimester; Morbid obesity (HS[place]; Anemia in pregnancy, third trimester; and Gestational hypertension, third trimester on their problem list.  Patient reports no complaints.  Contractions: Irregular. Vag. Bleeding: None.  Movement: Present. Denies leaking of fluid.   The following portions of the patient's history were reviewed and updated as appropriate: allergies, current medications, past family history, past medical history, past social history, past surgical history and problem list. Problem list updated.  Objective:   Vitals:   10/27/17 1048  BP: 131/84  Pulse: 67  Weight: 199 lb 3.2 oz (90.4 kg)    Fetal Status: Fetal Heart Rate (bpm): 147; doppler; NST  Fundal Height: 34 cm Movement: Present     General:  Alert, oriented and cooperative. Patient is in no acute distress.  Skin: Skin is warm and dry. No rash noted.   Cardiovascular: Normal heart rate noted  Respiratory: Normal respiratory effort, no problems with respiration noted  Abdomen: Soft, gravid, appropriate for gestational age.  Pain/Pressure: Absent     Pelvic: Cervical exam deferred        Extremities: Normal range of motion.  Edema: None  Mental Status:  Normal mood and affect. Normal behavior. Normal judgment and thought content.  NST: + accels, + variable decels with quick  return to baseline, moderate variability, Cat. 2 tracing. No contractions on toco. FHR baseline: 150's.   Assessment and Plan:  Pregnancy: G1P0 at 344w5d1. Supervision of high risk first pregnancy, antepartum     Non-reactive NST  2. Rubella non-immune status, antepartum     MMR postpartum  3. Morbid obesity (HCBrook[place]    8 lb weight gain this pregnancy.   4. Gestational hypertension, third trimester     Last [place] normal on 10/15/17.   - CBC - Comp Met (CMET) - Protein / creatinine ratio, urine - USKoreaFM OB FOLLOW UP; Future - Fetal nonstress test; Future  5. Non-reactive NST (non-stress test)     - USK[place]FM FETAL BPP WO NON STRESS; Future  Preterm labor symptoms and general obstetric precautions including but not limited to vaginal bleeding, contractions, leaking of fluid and fetal movement were reviewed in detail with the patient. Please refer to After Visit Summary for other counseling recommendations.  Return in about 1 week (around 11/03/2017) for GBS, HOB, Bi-weekly Antenatal testing twice weekly NST with weekly AFI, IOL _0  weeks.   RaMorene CrockerCNM

## 2017-10-27 NOTE — Progress Notes (Signed)
Patient reports fetal movement with some contractions.

## 2017-10-28 LAB — COMPREHENSIVE METABOLIC PANEL
ALBUMIN: 3.7 g/dL (ref 3.5–5.5)
ALK PHOS: 110 IU/L (ref 39–117)
ALT: 11 IU/L (ref 0–32)
AST: 13 IU/L (ref 0–40)
Albumin/Globulin Ratio: 1.4 (ref 1.2–2.2)
BUN / CREAT RATIO: 8 — AB (ref 9–23)
BUN: 5 mg/dL — AB (ref 6–20)
CHLORIDE: 102 mmol/L (ref 96–106)
CO2: 24 mmol/L (ref 20–29)
CREATININE: 0.6 mg/dL (ref 0.57–1.00)
Calcium: 9.7 mg/dL (ref 8.7–10.2)
GFR calc Af Amer: 150 mL/min/{1.73_m2} (ref >59)
GFR calc non Af Amer: 130 mL/min/{1.73_m2} (ref >59)
Globulin, Total: 2.7 g/dL (ref 1.5–4.5)
Glucose: 72 mg/dL (ref 65–99)
Potassium: 4.7 mmol/L (ref 3.5–5.2)
Sodium: 139 mmol/L (ref 134–144)
Total Protein: 6.4 g/dL (ref 6.0–8.5)

## 2017-10-28 LAB — CBC
HEMATOCRIT: 34 % (ref 34.0–46.6)
Hemoglobin: 11.4 g/dL (ref 11.1–15.9)
MCH: 31 pg (ref 26.6–33.0)
MCHC: 33.5 g/dL (ref 31.5–35.7)
MCV: 92 fL (ref 79–97)
Platelets: 232 10*3/uL (ref 150–379)
RBC: 3.68 x10E6/uL — AB (ref 3.77–5.28)
RDW: 13.9 % (ref 12.3–15.4)
WBC: 8.2 10*3/uL (ref 3.4–10.8)

## 2017-10-28 LAB — PROTEIN / CREATININE RATIO, URINE: CREATININE, UR: 13.1 mg/dL

## 2017-10-29 ENCOUNTER — Other Ambulatory Visit: Payer: Self-pay | Admitting: Pediatrics

## 2017-10-29 DIAGNOSIS — O219 Vomiting of pregnancy, unspecified: Secondary | ICD-10-CM

## 2017-10-29 MED ORDER — ONDANSETRON HCL 8 MG PO TABS: 8.0000 mg | ORAL_TABLET | Freq: Three times a day (TID) | ORAL | 2 refills | Status: DC | PRN

## 2017-10-30 ENCOUNTER — Telehealth (HOSPITAL_COMMUNITY): Payer: Self-pay | Admitting: *Deleted

## 2017-10-30 ENCOUNTER — Encounter (HOSPITAL_COMMUNITY): Payer: Self-pay | Admitting: *Deleted

## 2017-10-30 NOTE — Telephone Encounter (Signed)
Preadmission screen  

## 2017-11-01 ENCOUNTER — Encounter (HOSPITAL_COMMUNITY): Payer: Self-pay

## 2017-11-01 ENCOUNTER — Inpatient Hospital Stay (HOSPITAL_COMMUNITY)
Admission: AD | Admit: 2017-11-01 | Discharge: 2017-11-01 | Disposition: A | Discharge status: Home / Self Care | Payer: Medicaid Other | Source: Ambulatory Visit | Attending: Obstetrics & Gynecology | Admitting: Obstetrics & Gynecology

## 2017-11-01 DIAGNOSIS — Z3A35 35 weeks gestation of pregnancy: Secondary | ICD-10-CM | POA: Insufficient documentation

## 2017-11-01 DIAGNOSIS — O99513 Diseases of the respiratory system complicating pregnancy, third trimester: Secondary | ICD-10-CM | POA: Diagnosis present

## 2017-11-01 DIAGNOSIS — Z87891 Personal history of nicotine dependence: Secondary | ICD-10-CM | POA: Diagnosis not present

## 2017-11-01 DIAGNOSIS — J069 Acute upper respiratory infection, unspecified: Secondary | ICD-10-CM

## 2017-11-01 DIAGNOSIS — J029 Acute pharyngitis, unspecified: Secondary | ICD-10-CM | POA: Insufficient documentation

## 2017-11-01 DIAGNOSIS — O133 Gestational [pregnancy-induced] hypertension without significant proteinuria, third trimester: Secondary | ICD-10-CM | POA: Diagnosis not present

## 2017-11-01 DIAGNOSIS — O9989 Other specified diseases and conditions complicating pregnancy, childbirth and the puerperium: Secondary | ICD-10-CM | POA: Diagnosis not present

## 2017-11-01 LAB — URINALYSIS, ROUTINE W REFLEX MICROSCOPIC
BILIRUBIN URINE: NEGATIVE
GLUCOSE, UA: NEGATIVE mg/dL
HGB URINE DIPSTICK: NEGATIVE
KETONES UR: NEGATIVE mg/dL
LEUKOCYTES UA: NEGATIVE
Nitrite: NEGATIVE
PROTEIN: NEGATIVE mg/dL
Specific Gravity, Urine: 1.003 — ABNORMAL LOW (ref 1.005–1.030)
pH: 6 (ref 5.0–8.0)

## 2017-11-01 LAB — COMPREHENSIVE METABOLIC PANEL
ALT: 11 U/L — ABNORMAL LOW (ref 14–54)
ANION GAP: 10 (ref 5–15)
AST: 18 U/L (ref 15–41)
Albumin: 2.9 g/dL — ABNORMAL LOW (ref 3.5–5.0)
Alkaline Phosphatase: 119 U/L (ref 38–126)
BUN: <5 mg/dL — ABNORMAL LOW (ref 6–20)
CHLORIDE: 101 mmol/L (ref 101–111)
CO2: 22 mmol/L (ref 22–32)
Calcium: 9.1 mg/dL (ref 8.9–10.3)
Creatinine, Ser: 0.57 mg/dL (ref 0.44–1.00)
Glucose, Bld: 84 mg/dL (ref 65–99)
POTASSIUM: 3.6 mmol/L (ref 3.5–5.1)
Sodium: 133 mmol/L — ABNORMAL LOW (ref 135–145)
TOTAL PROTEIN: 7 g/dL (ref 6.5–8.1)
Total Bilirubin: 0.4 mg/dL (ref 0.3–1.2)

## 2017-11-01 LAB — CBC
HCT: 29.5 % — ABNORMAL LOW (ref 36.0–46.0)
Hemoglobin: 10.5 g/dL — ABNORMAL LOW (ref 12.0–15.0)
MCH: 32 pg (ref 26.0–34.0)
MCHC: 35.6 g/dL (ref 30.0–36.0)
MCV: 89.9 fL (ref 78.0–100.0)
PLATELETS: 204 10*3/uL (ref 150–400)
RBC: 3.28 MIL/uL — ABNORMAL LOW (ref 3.87–5.11)
RDW: 12.7 % (ref 11.5–15.5)
WBC: 12.6 10*3/uL — AB (ref 4.0–10.5)

## 2017-11-01 LAB — RAPID STREP SCREEN (MED CTR MEBANE ONLY): STREPTOCOCCUS, GROUP A SCREEN (DIRECT): NEGATIVE

## 2017-11-01 LAB — INFLUENZA PANEL BY PCR (TYPE A & B)
Influenza A By PCR: NEGATIVE
Influenza B By PCR: NEGATIVE

## 2017-11-01 LAB — PROTEIN / CREATININE RATIO, URINE: CREATININE, URINE: 50 mg/dL

## 2017-11-01 MED ORDER — HYDROCOD POLST-CPM POLST ER 10-8 MG/5ML PO SUER: 5.0000 mL | Freq: Two times a day (BID) | ORAL | 0 refills | Status: DC | PRN

## 2017-11-01 MED ORDER — GUAIFENESIN ER 600 MG PO TB12: 600.0000 mg | ORAL_TABLET | Freq: Two times a day (BID) | ORAL | 1 refills | Status: DC

## 2017-11-01 MED ORDER — HYDROCOD POLST-CPM POLST ER 10-8 MG/5ML PO SUER
5.0000 mL | Freq: Once | ORAL | Status: AC
  Administered 2017-11-01: 5 mL via ORAL

## 2017-11-01 NOTE — MAU Provider Note (Signed)
Chief Complaint:  Headache; Sore Throat; Nasal Congestion; Fever; and Decreased Fetal Movement   First Provider Initiated Contact with Patient 11/01/17 1029     HPI: Deanna Valencia is a 21 y.o. G1P0 at 35w3dwho presents to maternity admissions reporting decreased fetal movement, nasal congestion and sore throat since yesterday.  Feels movement now. . She denies LOF, vaginal bleeding, vaginal itching/burning, urinary symptoms, h/a, dizziness, n/v, diarrhea, constipation or fever/chills.    Headache   This is a new problem. The current episode started yesterday. The problem has been unchanged. The pain is located in the frontal region. The pain does not radiate. The quality of the pain is described as aching. Associated symptoms include coughing, a fever, rhinorrhea, sinus pressure and a sore throat. Pertinent negatives include no anorexia, muscle aches, nausea, numbness, photophobia, swollen glands, visual change, vomiting or weakness. She has tried nothing for the symptoms.  Sore Throat   This is a new problem. The current episode started yesterday. The problem has been unchanged. There has been no fever. The pain is moderate. Associated symptoms include coughing and headaches. Pertinent negatives include no shortness of breath, swollen glands, trouble swallowing or vomiting. She has had no exposure to strep or mono. She has tried nothing for the symptoms.  Fever   This is a new problem. The current episode started yesterday. Her temperature was unmeasured prior to arrival. Associated symptoms include coughing, headaches and a sore throat. Pertinent negatives include no muscle aches, nausea or vomiting. She has tried nothing for the symptoms.    RN Note: Patient presents with c/o decreased fetal movement, nasal congestion, fever, sore throat which all started yesterday    Past Medical History: Past Medical History:  Diagnosis Date  . Allergy   . Anxiety   . Depression   . Eating disorder    . Foster care (status) 10/18/2013  . Morbid obesity ([place]) 06/15/2017  . UTI (urinary tract infection) 10/03/2013    Past obstetric history: OB History  Gravida Para Term Preterm AB Living  1         0  SAB TAB Ectopic Multiple Live Births               # Outcome Date GA Lbr Len/2nd Weight Sex Delivery Anes PTL Lv  1 Current               Past Surgical History: History reviewed. No pertinent surgical history.  Family History: Family History  Problem Relation Age of Onset  . Hypertension Father   . Diabetes Paternal Grandfather   . Hypertension Paternal Grandfather   . Hearing loss Paternal Grandfather     Social History: Social History   Tobacco Use  . Smoking status: Former Smoker    Packs/day: 0.25    Years: 1.00    Pack years: 0.25    Types: Cigarettes  . Smokeless tobacco: Never Used  . Tobacco comment: states smokes occasionally not daily.. black and milds  Substance Use Topics  . Alcohol use: No    Alcohol/week: 0.0 oz    Comment: occasionally..07/22/16 quit a year ago... 12-16-16  wine   . Drug use: Yes    Types: Marijuana    Comment: last smoked right before preg confirmed.    Allergies:  Allergies  Allergen Reactions  . Amoxicillin Hives    Has patient had a PCN reaction causing immediate rash, facial/tongue/throat swelling, SOB or lightheadedness with hypotension: no Has patient had a PCN reaction causing severe rash  involving mucus membranes or skin necrosis: unknown Has patient had a PCN reaction that required hospitalization: no Has patient had a PCN reaction occurring within the last 10 years: no If all of the above answers are "NO", then may proceed with Cephalosporin use.   . Tomato Itching and Rash    Meds:  Medications Prior to Admission  Medication Sig Dispense Refill Last Dose  . ondansetron (ZOFRAN) 8 MG tablet Take 1 tablet (8 mg total) by mouth every 8 (eight) hours as needed for nausea or vomiting. 40 tablet 2 10/31/2017 at Unknown  time  . Prenatal Vit-Fe Phos-FA-Omega (VITAFOL GUMMIES) 3.33-0.333-34.8 MG CHEW Chew 3 tablets by mouth at bedtime. (Patient not taking: Reported on 11/01/2017) 90 tablet 12 Not Taking at Unknown time  . Prenatal-DSS-FeCb-FeGl-FA (CITRANATAL BLOOM) 90-1 MG TABS Take 1 tablet by mouth daily. (Patient not taking: Reported on 10/27/2017) 30 tablet 12 Not Taking at Unknown time    I have reviewed patient's Past Medical Hx, Surgical Hx, Family Hx, Social Hx, medications and allergies.   ROS:  Review of Systems  Constitutional: Positive for fever.  HENT: Positive for rhinorrhea, sinus pressure and sore throat. Negative for trouble swallowing.   Eyes: Negative for photophobia.  Respiratory: Positive for cough. Negative for shortness of breath.   Gastrointestinal: Negative for anorexia, nausea and vomiting.  Neurological: Positive for headaches. Negative for weakness and numbness.   Other systems negative  Physical Exam   Patient Vitals for the past 24 hrs:  BP Temp Temp src Pulse Resp SpO2  11/01/17 1304 - - - - - 99 %  11/01/17 1301 131/73 - - (!) 112 - -  11/01/17 1259 - - - - - 99 %  11/01/17 1258 - - - - - 100 %  11/01/17 1253 - - - - - 99 %  11/01/17 1252 - - - - - 99 %  11/01/17 1200 137/80 - - (!) 117 - 100 %  11/01/17 1155 136/69 98 F (36.7 C) Axillary (!) 122 - 100 %  11/01/17 1153 - - - - - 100 %  11/01/17 1148 - - - - - 100 %  11/01/17 1146 (!) 168/94 - - (!) 106 - -  11/01/17 1143 - - - - - 100 %  11/01/17 1138 - - - - - 100 %  11/01/17 1133 - - - - - 100 %  11/01/17 1100 (!) 143/108 - - (!) 112 - -  11/01/17 0944 (!) 151/87 99.2 F (37.3 C) - (!) 121 16 -   Constitutional: Well-developed, well-nourished female in no acute distress.  Throat slightly erethematous Cardiovascular: normal rate and rhythm Respiratory: normal effort, clear to auscultation bilaterally GI: Abd soft, non-tender, gravid appropriate for gestational age.   No rebound or guarding. MS:  Extremities nontender, no edema, normal ROM Neurologic: Alert and oriented x 4.  GU: Neg CVAT.  PELVIC EXAM:  deferred  FHT:  Baseline 135 , moderate variability, accelerations present, no decelerations Contractions:  Irregular     Labs: Results for orders placed or performed during the hospital encounter of 11/01/17 (from the past 24 hour(s))  Urinalysis, Routine w reflex microscopic     Status: Abnormal   Collection Time: 11/01/17  9:35 AM  Result Value Ref Range   Color, Urine STRAW (A) YELLOW   APPearance CLEAR CLEAR   Specific Gravity, Urine 1.003 (L) 1.005 - 1.030   pH 6.0 5.0 - 8.0   Glucose, UA NEGATIVE NEGATIVE mg/dL   Hgb  urine dipstick NEGATIVE NEGATIVE   Bilirubin Urine NEGATIVE NEGATIVE   Ketones, ur NEGATIVE NEGATIVE mg/dL   Protein, ur NEGATIVE NEGATIVE mg/dL   Nitrite NEGATIVE NEGATIVE   Leukocytes, UA NEGATIVE NEGATIVE  Protein / creatinine ratio, urine     Status: None   Collection Time: 11/01/17  9:35 AM  Result Value Ref Range   Creatinine, Urine 50.00 mg/dL   Total Protein, Urine <6 mg/dL   Protein Creatinine Ratio        0.00 - 0.15 mg/mg[Cre]  Influenza panel by PCR (type A & B)     Status: None   Collection Time: 11/01/17 10:13 AM  Result Value Ref Range   Influenza A By PCR NEGATIVE NEGATIVE   Influenza B By PCR NEGATIVE NEGATIVE  CBC     Status: Abnormal   Collection Time: 11/01/17 10:41 AM  Result Value Ref Range   WBC 12.6 (H) 4.0 - 10.5 K/uL   RBC 3.28 (L) 3.87 - 5.11 MIL/uL   Hemoglobin 10.5 (L) 12.0 - 15.0 g/dL   HCT 29.5 (L) 36.0 - 46.0 %   MCV 89.9 78.0 - 100.0 fL   MCH 32.0 26.0 - 34.0 pg   MCHC 35.6 30.0 - 36.0 g/dL   RDW 12.7 11.5 - 15.5 %   Platelets 204 150 - 400 K/uL  Comprehensive metabolic panel     Status: Abnormal   Collection Time: 11/01/17 10:41 AM  Result Value Ref Range   Sodium 133 (L) 135 - 145 mmol/L   Potassium 3.6 3.5 - 5.1 mmol/L   Chloride 101 101 - 111 mmol/L   CO2 22 22 - 32 mmol/L   Glucose, Bld 84 65 - 99  mg/dL   BUN <5 (L) 6 - 20 mg/dL   Creatinine, Ser 0.57 0.44 - 1.00 mg/dL   Calcium 9.1 8.9 - 10.3 mg/dL   Total Protein 7.0 6.5 - 8.1 g/dL   Albumin 2.9 (L) 3.5 - 5.0 g/dL   AST 18 15 - 41 U/L   ALT 11 (L) 14 - 54 U/L   Alkaline Phosphatase 119 38 - 126 U/L   Total Bilirubin 0.4 0.3 - 1.2 mg/dL   GFR calc non Af Amer >60 >60 mL/min   GFR calc Af Amer >60 >60 mL/min   Anion gap 10 5 - 15   O/Positive/-- (07/09 1524)  Imaging:    MAU Course/MDM: I have ordered labs and reviewed results. Preeclampsia labs were normal, Strep and Flu tests were both negative NST reviewed and found to be reactive and Category I Consult Dr Elonda Husky with presentation, exam findings and test results. No new findings for preeclampsia Treatments in MAU included Tussionex which helped her cough and sore throat..    Assessment: 1. Upper respiratory tract infection, unspecified type   2. Sore throat   3. Gestational hypertension, third trimester     Plan: Discharge home Conservative care for URI Rx Mucinex for congestion Rx Tussionex for cough Warm water gargles.  Push PO fluids Labor precautions and fetal kick counts Follow up in Office for prenatal visits and recheck of BP  Follow-up [place]. Schedule an appointment as soon as possible for a visit.   Specialty:  Obstetrics and Gynecology Contact information: [address] [place] [id number]         Encouraged to return here or to other Urgent Care/ED if she develops worsening of symptoms, increase in pain, fever,  or other concerning symptoms.   Pt stable at time of discharge.  Hansel Feinstein CNM, MSN Certified Nurse-Midwife 11/01/2017 1:06 PM

## 2017-11-01 NOTE — MAU Note (Signed)
Patient presents with c/o decreased fetal movement, nasal congestion, fever, sore throat which all started yesterday.

## 2017-11-01 NOTE — Progress Notes (Addendum)
G1@ 35.[redacted] wksga. Presents to triage for flu like symptoms. Denies LOF or bleeding. Ctx noted but pt doesn't feel them. High BP noted. Pt states being monitored. Denies symptoms of pre-E.   1108: Monitor adjusted.   1310: rapid strip test redone and sent to lab.   1314: d/c instructions given with pt understanding. Pt left unit via ambulatory. Px given and instructed pt to give to pharmacist at pharmacy drug store of her choice.

## 2017-11-01 NOTE — Discharge Instructions (Signed)
Sore Throat  A sore throat is pain, burning, irritation, or scratchiness in the throat. When you have a sore throat, you may feel pain or tenderness in your throat when you swallow or talk.  Many things can cause a sore throat, including:   An infection.   Seasonal allergies.   Dryness in the air.   Irritants, such as smoke or pollution.   Gastroesophageal reflux disease (GERD).   A tumor.    A sore throat is often the first sign of another sickness. It may happen with other symptoms, such as coughing, sneezing, fever, and swollen neck glands. Most sore throats go away without medical treatment.  Follow these instructions at home:   Take over-the-counter medicines only as told by your health care provider.   Drink enough fluids to keep your urine clear or pale yellow.   Rest as needed.   To help with pain, try:  ? Sipping warm liquids, such as broth, herbal tea, or warm water.  ? Eating or drinking cold or frozen liquids, such as frozen ice pops.  ? Gargling with a salt-water mixture 3-4 times a day or as needed. To make a salt-water mixture, completely dissolve -1 tsp of salt in 1 cup of warm water.  ? Sucking on hard candy or throat lozenges.  ? Putting a cool-mist humidifier in your bedroom at night to moisten the air.  ? Sitting in the bathroom with the door closed for 5-10 minutes while you run hot water in the shower.   Do not use any tobacco products, such as cigarettes, chewing tobacco, and e-cigarettes. If you need help quitting, ask your health care provider.  Contact a health care provider if:   You have a fever for more than 2-3 days.   You have symptoms that last (are persistent) for more than 2-3 days.   Your throat does not get better within 7 days.   You have a fever and your symptoms suddenly get worse.  Get help right away if:   You have difficulty breathing.   You cannot swallow fluids, soft foods, or your saliva.   You have increased swelling in your throat or neck.   You have  persistent nausea and vomiting.  This information is not intended to replace advice given to you by your health care provider. Make sure you discuss any questions you have with your health care provider.  Document Released: 12/04/2004 Document Revised: 06/22/2016 Document Reviewed: 08/17/2015  Elsevier Interactive Patient Education  2018 [employer]        Upper Respiratory Infection, Adult  Most upper respiratory infections (URIs) are caused by a virus. A URI affects the nose, throat, and upper air passages. The most common type of URI is often called "the common cold."  Follow these instructions at home:   Take medicines only as told by your doctor.   Gargle warm saltwater or take cough drops to comfort your throat as told by your doctor.   Use a warm mist humidifier or inhale steam from a shower to increase air moisture. This may make it easier to breathe.   Drink enough fluid to keep your pee (urine) clear or pale yellow.   Eat soups and other clear broths.   Have a healthy diet.   Rest as needed.   Go back to work when your fever is gone or your doctor says it is okay.  ? You may need to stay home longer to avoid giving your URI to   others.  ? You can also wear a face mask and wash your hands often to prevent spread of the virus.   Use your inhaler more if you have asthma.   Do not use any tobacco products, including cigarettes, chewing tobacco, or electronic cigarettes. If you need help quitting, ask your doctor.  Contact a doctor if:   You are getting worse, not better.   Your symptoms are not helped by medicine.   You have chills.   You are getting more short of breath.   You have brown or red mucus.   You have yellow or brown discharge from your nose.   You have pain in your face, especially when you bend forward.   You have a fever.   You have puffy (swollen) neck glands.   You have pain while swallowing.   You have white areas in the back of your throat.  Get help right away  if:   You have very bad or constant:  ? Headache.  ? Ear pain.  ? Pain in your forehead, behind your eyes, and over your cheekbones (sinus pain).  ? Chest pain.   You have long-lasting (chronic) lung disease and any of the following:  ? Wheezing.  ? Long-lasting cough.  ? Coughing up blood.  ? A change in your usual mucus.   You have a stiff neck.   You have changes in your:  ? Vision.  ? Hearing.  ? Thinking.  ? Mood.  This information is not intended to replace advice given to you by your health care provider. Make sure you discuss any questions you have with your health care provider.  Document Released: 04/14/2008 Document Revised: 06/29/2016 Document Reviewed: 02/01/2014  Elsevier Interactive Patient Education  2018 [employer] Inc[employer]

## 2017-11-03 ENCOUNTER — Encounter (HOSPITAL_COMMUNITY): Payer: Self-pay | Admitting: Emergency Medicine

## 2017-11-03 ENCOUNTER — Emergency Department (HOSPITAL_COMMUNITY): Payer: Medicaid Other

## 2017-11-03 ENCOUNTER — Other Ambulatory Visit: Payer: Self-pay

## 2017-11-03 ENCOUNTER — Emergency Department (HOSPITAL_COMMUNITY)
Admission: EM | Admit: 2017-11-03 | Discharge: 2017-11-03 | Disposition: A | Discharge status: Home / Self Care | Payer: Medicaid Other | Attending: Emergency Medicine | Admitting: Emergency Medicine

## 2017-11-03 DIAGNOSIS — Z87891 Personal history of nicotine dependence: Secondary | ICD-10-CM | POA: Insufficient documentation

## 2017-11-03 DIAGNOSIS — J039 Acute tonsillitis, unspecified: Secondary | ICD-10-CM | POA: Insufficient documentation

## 2017-11-03 DIAGNOSIS — J029 Acute pharyngitis, unspecified: Secondary | ICD-10-CM | POA: Diagnosis present

## 2017-11-03 LAB — CBC WITH DIFFERENTIAL/PLATELET
BASOS ABS: 0 10*3/uL (ref 0.0–0.1)
BASOS PCT: 0 %
EOS ABS: 0 10*3/uL (ref 0.0–0.7)
Eosinophils Relative: 0 %
HCT: 30.4 % — ABNORMAL LOW (ref 36.0–46.0)
HEMOGLOBIN: 10.4 g/dL — AB (ref 12.0–15.0)
Lymphocytes Relative: 7 %
Lymphs Abs: 0.9 10*3/uL (ref 0.7–4.0)
MCH: 31 pg (ref 26.0–34.0)
MCHC: 34.2 g/dL (ref 30.0–36.0)
MCV: 90.7 fL (ref 78.0–100.0)
MONOS PCT: 11 %
Monocytes Absolute: 1.4 10*3/uL — ABNORMAL HIGH (ref 0.1–1.0)
NEUTROS PCT: 82 %
Neutro Abs: 9.9 10*3/uL — ABNORMAL HIGH (ref 1.7–7.7)
Platelets: 209 10*3/uL (ref 150–400)
RBC: 3.35 MIL/uL — ABNORMAL LOW (ref 3.87–5.11)
RDW: 12.7 % (ref 11.5–15.5)
WBC: 12.2 10*3/uL — AB (ref 4.0–10.5)

## 2017-11-03 LAB — BASIC METABOLIC PANEL
ANION GAP: 9 (ref 5–15)
BUN: <5 mg/dL — ABNORMAL LOW (ref 6–20)
CALCIUM: 9.1 mg/dL (ref 8.9–10.3)
CHLORIDE: 103 mmol/L (ref 101–111)
CO2: 22 mmol/L (ref 22–32)
CREATININE: 0.59 mg/dL (ref 0.44–1.00)
GFR calc non Af Amer: >60 mL/min (ref >60)
Glucose, Bld: 80 mg/dL (ref 65–99)
Potassium: 3.7 mmol/L (ref 3.5–5.1)
SODIUM: 134 mmol/L — AB (ref 135–145)

## 2017-11-03 LAB — I-STAT CG4 LACTIC ACID, ED: LACTIC ACID, VENOUS: 0.56 mmol/L (ref 0.5–1.9)

## 2017-11-03 MED ORDER — ACETAMINOPHEN 325 MG PO TABS
650.0000 mg | ORAL_TABLET | Freq: Once | ORAL | Status: AC
  Administered 2017-11-03: 650 mg via ORAL
  Filled 2017-11-03: qty 2

## 2017-11-03 MED ORDER — AZITHROMYCIN 250 MG PO TABS
500.0000 mg | ORAL_TABLET | Freq: Once | ORAL | Status: AC
  Administered 2017-11-03: 500 mg via ORAL
  Filled 2017-11-03: qty 2

## 2017-11-03 MED ORDER — IOPAMIDOL (ISOVUE-300) INJECTION 61%
INTRAVENOUS | Status: AC
  Administered 2017-11-03: 75 mL
  Filled 2017-11-03: qty 75

## 2017-11-03 MED ORDER — AZITHROMYCIN 250 MG PO TABS: 500.0000 mg | ORAL_TABLET | Freq: Every day | ORAL | 0 refills | Status: AC

## 2017-11-03 MED ORDER — DEXAMETHASONE 10 MG/ML FOR PEDIATRIC ORAL USE
16.0000 mg | Freq: Once | INTRAMUSCULAR | Status: AC
  Administered 2017-11-03: 16 mg via ORAL
  Filled 2017-11-03 (×2): qty 1.6

## 2017-11-03 NOTE — ED Provider Notes (Signed)
[facility] EMERGENCY DEPARTMENT Provider Note   CSN: [id number] Arrival date & time: 11/03/17  4098     History   Chief Complaint Chief Complaint  Patient presents with  . Sore Throat    HPI Deanna Valencia is a 21 y.o. female 49w5days gestation presenting with worsening sore throat and fever.  [facility] and discharged with scripts that she was unable to fill as she stated that the pharmacies were closed.  She has not taken any antipyretics today or medications for her symptoms.  She has tried cough drops and salt water gargles without much relief. She reports left-sided radiating sore throat to her left ear.  She states that she is otherwise well and is feeling the baby moving.  Denies any known ill contacts.  Denies vaginal bleeding, leaking of fluids, contractions, urinary symptoms, dizziness, visual disturbances any other symptoms.  She report history similar symptoms multiple times throughout her life with strep.  HPI  Past Medical History:  Diagnosis Date  . Allergy   . Anxiety   . Depression   . Eating disorder   . Foster care (status) 10/18/2013  . Morbid obesity ([place]) 06/15/2017  . UTI (urinary tract infection) 10/03/2013    Patient Active Problem List   Diagnosis Date Noted  . Gestational hypertension, third trimester 10/27/2017  . Anemia in pregnancy, third trimester 09/03/2017  . Morbid obesity ([place]) 06/15/2017  . Trichomonal vaginitis during pregnancy in second trimester 05/29/2017  . Maternal varicella, non-immune 05/19/2017  . Rubella non-immune status, antepartum 05/19/2017  . Supervision of normal first pregnancy, antepartum 05/18/2017  . Uterine fibroid 05/18/2017  . Dermoid cyst of ovary 05/18/2017  . Deliberate self-cutting 05/18/2017  . Hidradenitis axillaris 11/11/2016  . Borderline personality disorder ([place]) 03/01/2015  . Generalized anxiety disorder 03/01/2015  . MDD (major depressive disorder),  recurrent, in partial remission ([place]) 03/01/2015  . PTSD (post-traumatic stress disorder) 03/01/2015    History reviewed. No pertinent surgical history.  OB History    Gravida Para Term Preterm AB Living   1         0   SAB TAB Ectopic Multiple Live Births                   Home Medications    Prior to Admission medications   Medication Sig Start Date End Date Taking? Authorizing Provider  ondansetron (ZOFRAN) 8 MG tablet Take 1 tablet (8 mg total) by mouth every 8 (eight) hours as needed for nausea or vomiting. 10/29/17  Yes Denney, Rachelle A, CNM  azithromycin (ZITHROMAX) 250 MG tablet Take 2 tablets (500 mg total) by mouth daily for 4 days. Take first 2 tablets together, then 1 every day until finished. 11/03/17 11/07/17  Avie Echevaria B, PA-C  chlorpheniramine-HYDROcodone (TUSSIONEX PENNKINETIC ER) 10-8 MG/5ML SUER Take 5 mLs by mouth every 12 (twelve) hours as needed for cough. Patient not taking: Reported on 11/03/2017 11/01/17   Seabron Spates, CNM  guaiFENesin (MUCINEX) 600 MG 12 hr tablet Take 1 tablet (600 mg total) by mouth 2 (two) times daily. Patient not taking: Reported on 11/03/2017 11/01/17   Seabron Spates, CNM  Prenatal Vit-Fe Phos-FA-Omega (VITAFOL GUMMIES) 3.33-0.333-34.8 MG CHEW Chew 3 tablets by mouth at bedtime. Patient not taking: Reported on 11/01/2017 05/18/17   Morene Crocker, CNM    Family History Family History  Problem Relation Age of Onset  . Hypertension Father   . Diabetes Paternal  Grandfather   . Hypertension Paternal Grandfather   . Hearing loss Paternal Grandfather     Social History Social History   Tobacco Use  . Smoking status: Former Smoker    Packs/day: 0.25    Years: 1.00    Pack years: 0.25    Types: Cigarettes  . Smokeless tobacco: Never Used  . Tobacco comment: states smokes occasionally not daily.. black and milds  Substance Use Topics  . Alcohol use: No    Alcohol/week: 0.0 oz    Comment:  occasionally..07/22/16 quit a year ago... 12-16-16  wine   . Drug use: Yes    Types: Marijuana    Comment: last smoked right before preg confirmed.     Allergies   Amoxicillin and Tomato   Review of Systems Review of Systems  Constitutional: Positive for fever. Negative for chills.  HENT: Positive for sore throat. Negative for congestion, ear pain, trouble swallowing and voice change.   Eyes: Negative for pain and visual disturbance.  Respiratory: Negative for cough, shortness of breath, wheezing and stridor.   Cardiovascular: Negative for chest pain and palpitations.  Gastrointestinal: Negative for abdominal pain and vomiting.  Genitourinary: Negative for difficulty urinating, dysuria, flank pain, hematuria, pelvic pain, vaginal bleeding and vaginal discharge.  Musculoskeletal: Negative for arthralgias, back pain, gait problem, joint swelling, myalgias and neck stiffness.  Skin: Negative for color change and rash.  Neurological: Negative for seizures and syncope.     Physical Exam Updated Vital Signs BP 129/89   Pulse 86   Temp 98.6 F (37 C) (Oral)   Resp 18   Ht 5\' 1"  (1.549 m)   Wt 90.3 kg (199 lb)   LMP 02/26/2017   SpO2 100%   BMI 37.60 kg/m   Physical Exam  Constitutional: She appears well-developed and well-nourished.  Non-toxic appearance. She does not appear ill. No distress.  Febrile at 100.9 with use of antipyretics on arrival, uncomfortable appearing, nontoxic no acute distress  HENT:  Head: Normocephalic and atraumatic.  Right Ear: External ear normal.  Left Ear: External ear normal.  Mouth/Throat: Uvula is midline and mucous membranes are normal. Mucous membranes are not pale and not dry. No uvula swelling. Posterior oropharyngeal erythema present. No oropharyngeal exudate, posterior oropharyngeal edema or tonsillar abscesses. Tonsils are 0 on the right. Tonsils are 0 on the left. Tonsillar exudate.  Both ear canals with cerumen obstruction.  Difficult to  visualize tympanic membrane. No gross oral abscess. Uvula is midline, arches are simmetrical and intact. No peritonsilar swelling. Mild tonsilar exudate on the right. No trismus. Sublingual mucosa is soft and non-tender. Tolerating oral secretions. No concern for ludwig's angina.  Eyes: Conjunctivae are normal. Right eye exhibits no discharge. Left eye exhibits no discharge.  Neck: Normal range of motion. Neck supple.  Cardiovascular: Normal rate, regular rhythm and normal heart sounds.  No murmur heard. Pulmonary/Chest: Effort normal and breath sounds normal. No stridor. No respiratory distress. She has no wheezes. She has no rales.  Abdominal: Soft. There is no tenderness.  Musculoskeletal: Normal range of motion. She exhibits no edema.  Lymphadenopathy:    She has cervical adenopathy.  Neurological: She is alert.  Skin: Skin is warm and dry. No rash noted. She is not diaphoretic. No erythema. No pallor.  Psychiatric: She has a normal mood and affect.  Nursing note and vitals reviewed.    ED Treatments / Results  Labs (all labs ordered are listed, but only abnormal results are displayed) Labs Reviewed  CBC  WITH DIFFERENTIAL/PLATELET - Abnormal; Notable for the following components:      Result Value   WBC 12.2 (*)    RBC 3.35 (*)    Hemoglobin 10.4 (*)    HCT 30.4 (*)    Neutro Abs 9.9 (*)    Monocytes Absolute 1.4 (*)    All other components within normal limits  BASIC METABOLIC PANEL - Abnormal; Notable for the following components:   Sodium 134 (*)    BUN <5 (*)    All other components within normal limits  URINALYSIS, ROUTINE W REFLEX MICROSCOPIC  I-STAT CG4 LACTIC ACID, ED  I-STAT CG4 LACTIC ACID, ED    EKG  EKG Interpretation None       Radiology Ct Soft Tissue Neck W Contrast  Result Date: 11/03/2017 CLINICAL DATA:  Sore throat, neck swelling, difficulty swallowing. EXAM: CT NECK WITH CONTRAST TECHNIQUE: Multidetector CT imaging of the neck was performed  using the standard protocol following the bolus administration of intravenous contrast. CONTRAST:  76mL ISOVUE-300 IOPAMIDOL (ISOVUE-300) INJECTION 61% COMPARISON:  None. FINDINGS: Pharynx and larynx: BILATERAL nasopharyngeal adenoidal hypertrophy. BILATERAL tonsillar enlargement and enhancement, mild edema in the parapharyngeal soft tissues, but no tonsillar or peritonsillar abscess. Edema tracks downward along the LEFT greater than RIGHT aryepiglottic folds. Normal larynx. Normal epiglottis. Trace retropharyngeal effusion. Salivary glands: No inflammation, mass, or stone. Thyroid: Upper limits normal of size.  No focal areas of nodularity. Lymph nodes: BILATERAL reactive cervical lymphadenopathy, greatest at level II. Vascular: Vessels are patent. Limited intracranial: Negative. Visualized orbits: Negative. Mastoids and visualized paranasal sinuses: Clear. Skeleton: No acute or aggressive process. Upper chest: Negative. Other: None. IMPRESSION: BILATERAL nasopharyngeal adenoidal and tonsillar inflammation, consistent with tonsillitis, without tonsillar or peritonsillar abscess. No airway compromise. Normal epiglottis and larynx. BILATERAL reactive lymphadenopathy. Electronically Signed   By: Staci Righter M.D.   On: 11/03/2017 12:05    Procedures Procedures (including critical care time)  Medications Ordered in ED Medications  acetaminophen (TYLENOL) tablet 650 mg (650 mg Oral Given 11/03/17 1101)  dexamethasone (DECADRON) 10 MG/ML injection for Pediatric ORAL use 16 mg (16 mg Oral Given 11/03/17 1211)  iopamidol (ISOVUE-300) 61 % injection (75 mLs  Contrast Given 11/03/17 1125)  azithromycin (ZITHROMAX) tablet 500 mg (500 mg Oral Given 11/03/17 1240)     Initial Impression / Assessment and Plan / ED Course  I have reviewed the triage vital signs and the nursing notes.  Pertinent labs & imaging results that were available during my care of the patient were reviewed by me and considered in my  medical decision making (see chart for details).     Patient presenting 35-week 5 days gestation with persistent sore throat worsening over the last 2 days.  Negative rapid strep and flu PCR 2 days ago and mildly elevated white count otherwise unremarkable labs.  Patient with difficult to visualize oropharynx.  Exudate noted.  Pain with palpation of the neck on the left.  Will obtain labs and imaging to rule out soft tissue infection spread and give oral Decadron for symptomatic relief. Patient was discussed with Dr. Lita Mains who agrees with assessment and plan.  On reassessment, patient reported significant improvement and was eating crackers.  Vitals normal and stable.  Patient is currently being followed for gestational hypertension and is due to follow-up for routine OB visit today.  She has an appointment on the 27th and she reports that she is scheduled to be induced on January 3.  Rapid response team has evaluated patient  and reported reassuring findings on fetal monitor.  Will discharge home with antibiotics and close follow-up with PCP and OB/GYN.  Discussed strict return precautions and advised to return to the emergency department if experiencing any new or worsening symptoms. Instructions were understood and patient agreed with discharge plan.  Final Clinical Impressions(s) / ED Diagnoses   Final diagnoses:  Tonsillitis    ED Discharge Orders        Ordered    azithromycin (ZITHROMAX) 250 MG tablet  Daily     11/03/17 1225       Dossie Der 11/03/17 1309    Julianne Rice, MD 11/03/17 [phone]

## 2017-11-03 NOTE — Discharge Instructions (Addendum)
As discussed, make sure that you stay well-hydrated.  Tylenol for pain and follow-up with your OB/GYN and primary care provider tomorrow.  Take your entire course of antibiotics even if you feel better.  Drink plenty of fluids to maintain hydration. Your CT scan did not show any evidence of abscess.   Return if you experience difficulty swallowing, breathing, worsening symptoms or new concerning symptoms in the meantime.

## 2017-11-03 NOTE — ED Triage Notes (Signed)
Pt here with sore throat-- was seen at [facility] on the 23rd for same-- states that she could not get Rx filled because no where is open, explained to pt the pharmacies that are open today--  Pt is [redacted] weeks pregnant-- normal pregnancy-- is getting prenatal care--  Pt has a fever at present-- was [facility] and [facility]" [facility] does not know results of strep.

## 2017-11-03 NOTE — ED Notes (Signed)
OB Rapid Response RN en route.

## 2017-11-03 NOTE — ED Notes (Signed)
Patient given discharge instructions and verbalized understanding.  Patient stable to discharge at this time.  Patient is alert and oriented to baseline.  No distressed noted at this time.  All belongings taken with the patient at discharge.   

## 2017-11-03 NOTE — Progress Notes (Signed)
Dr Kennon Rounds notified that pt is here complaining of sore throat with a fever of 100.9. Pt tested negative for flu two days ago but strep test is pending.  Pt has no complaints as far as pregnancy is concerned. Reports positive fetal movement, no leaking of fluid, and occasional nonpainful ucs.  Md gives order for nst and then pt may be cleared obstetrically.

## 2017-11-03 NOTE — ED Notes (Signed)
Attempted to notify OB Rapid Response RN that pt is here, but no answer. Called the operator at [facility], spoke w/ Lavella Lemons who stated she is going to overhead page OB Rapid Response RN to this NS.

## 2017-11-04 ENCOUNTER — Telehealth: Payer: Self-pay | Admitting: *Deleted

## 2017-11-04 ENCOUNTER — Other Ambulatory Visit: Payer: Self-pay

## 2017-11-04 ENCOUNTER — Encounter: Payer: Self-pay | Admitting: Obstetrics

## 2017-11-04 ENCOUNTER — Encounter: Payer: Self-pay | Admitting: Obstetrics and Gynecology

## 2017-11-04 LAB — CULTURE, GROUP A STREP (THRC)

## 2017-11-04 NOTE — Telephone Encounter (Signed)
Pharmacy called related to Rx: azithromycin .Marland KitchenMarland KitchenEDCM clarified with EDP Alroy Dust) to change Rx dispense as written.

## 2017-11-05 ENCOUNTER — Ambulatory Visit (INDEPENDENT_AMBULATORY_CARE_PROVIDER_SITE_OTHER): Payer: Medicaid Other | Admitting: Obstetrics

## 2017-11-05 ENCOUNTER — Other Ambulatory Visit: Payer: Medicaid Other

## 2017-11-05 ENCOUNTER — Other Ambulatory Visit (HOSPITAL_COMMUNITY)
Admission: RE | Admit: 2017-11-05 | Discharge: 2017-11-05 | Disposition: A | Discharge status: Home / Self Care | Payer: Medicaid Other | Source: Ambulatory Visit | Attending: Obstetrics and Gynecology | Admitting: Obstetrics and Gynecology

## 2017-11-05 ENCOUNTER — Encounter: Payer: Self-pay | Admitting: Obstetrics

## 2017-11-05 VITALS — BP 141/84 | HR 69 | Wt 195.5 lb

## 2017-11-05 DIAGNOSIS — F603 Borderline personality disorder: Secondary | ICD-10-CM | POA: Insufficient documentation

## 2017-11-05 DIAGNOSIS — F329 Major depressive disorder, single episode, unspecified: Secondary | ICD-10-CM | POA: Insufficient documentation

## 2017-11-05 DIAGNOSIS — Z3400 Encounter for supervision of normal first pregnancy, unspecified trimester: Secondary | ICD-10-CM | POA: Diagnosis present

## 2017-11-05 DIAGNOSIS — Z3403 Encounter for supervision of normal first pregnancy, third trimester: Secondary | ICD-10-CM | POA: Diagnosis not present

## 2017-11-05 DIAGNOSIS — O99213 Obesity complicating pregnancy, third trimester: Secondary | ICD-10-CM | POA: Diagnosis not present

## 2017-11-05 DIAGNOSIS — O133 Gestational [pregnancy-induced] hypertension without significant proteinuria, third trimester: Secondary | ICD-10-CM | POA: Insufficient documentation

## 2017-11-05 DIAGNOSIS — O99343 Other mental disorders complicating pregnancy, third trimester: Secondary | ICD-10-CM | POA: Insufficient documentation

## 2017-11-05 DIAGNOSIS — Z3A36 36 weeks gestation of pregnancy: Secondary | ICD-10-CM | POA: Insufficient documentation

## 2017-11-05 DIAGNOSIS — F411 Generalized anxiety disorder: Secondary | ICD-10-CM | POA: Diagnosis not present

## 2017-11-05 DIAGNOSIS — F4310 Post-traumatic stress disorder, unspecified: Secondary | ICD-10-CM | POA: Insufficient documentation

## 2017-11-06 ENCOUNTER — Encounter: Payer: Self-pay | Admitting: Obstetrics

## 2017-11-06 ENCOUNTER — Other Ambulatory Visit: Payer: Self-pay | Admitting: Obstetrics

## 2017-11-06 ENCOUNTER — Other Ambulatory Visit: Payer: Self-pay | Admitting: Advanced Practice Midwife

## 2017-11-06 DIAGNOSIS — B9689 Other specified bacterial agents as the cause of diseases classified elsewhere: Secondary | ICD-10-CM

## 2017-11-06 DIAGNOSIS — N760 Acute vaginitis: Principal | ICD-10-CM

## 2017-11-06 LAB — CERVICOVAGINAL ANCILLARY ONLY
BACTERIAL VAGINITIS: POSITIVE — AB
Candida vaginitis: NEGATIVE
Chlamydia: NEGATIVE
NEISSERIA GONORRHEA: NEGATIVE
Trichomonas: NEGATIVE

## 2017-11-06 MED ORDER — SECNIDAZOLE 2 G PO PACK: 1.0000 | PACK | Freq: Once | ORAL | 2 refills | Status: AC

## 2017-11-06 NOTE — Progress Notes (Signed)
Subjective:  Deanna Valencia is a 21 y.o. G1P0 at [redacted]w[redacted]d being seen today for ongoing prenatal care.  She is currently monitored for the following issues for this high-risk pregnancy and has Borderline personality disorder ([place]); Generalized anxiety disorder; MDD (major depressive disorder), recurrent, in partial remission ([place]); PTSD (post-traumatic stress disorder); Hidradenitis axillaris; Supervision of normal first pregnancy, antepartum; Uterine fibroid; Dermoid cyst of ovary; Deliberate self-cutting; Maternal varicella, non-immune; Rubella non-immune status, antepartum; Trichomonal vaginitis during pregnancy in second trimester; Morbid obesity ([place]); Anemia in pregnancy, third trimester; and Gestational hypertension, third trimester on their problem list.  Patient reports no complaints.  Contractions: Not present. Vag. Bleeding: None.  Movement: Present. Denies leaking of fluid.   The following portions of the patient's history were reviewed and updated as appropriate: allergies, current medications, past family history, past medical history, past social history, past surgical history and problem list. Problem list updated.  Objective:   Vitals:   11/05/17 0837 11/05/17 0838  BP: (!) 144/87 (!) 141/84  Pulse: 69   Weight: 195 lb 8 oz (88.7 kg)     Fetal Status: Fetal Heart Rate (bpm): 140   Movement: Present  Presentation: Vertex  General:  Alert, oriented and cooperative. Patient is in no acute distress.  Skin: Skin is warm and dry. No rash noted.   Cardiovascular: Normal heart rate noted  Respiratory: Normal respiratory effort, no problems with respiration noted  Abdomen: Soft, gravid, appropriate for gestational age. Pain/Pressure: Present     Pelvic:  Cervical exam deferred        Extremities: Normal range of motion.  Edema: None  Mental Status: Normal mood and affect. Normal behavior. Normal judgment and thought content.   Urinalysis:      Assessment and Plan:  Pregnancy:  G1P0 at [redacted]w[redacted]d  1. Supervision of normal first pregnancy, antepartum Rx: - Strep Gp B Culture+Rflx - Cervicovaginal ancillary only  2. Gestational hypertension, third trimester Rx: - US OB Limited; Future - Fetal nonstress test  Term labor symptoms and general obstetric precautions including but not limited to vaginal bleeding, contractions, leaking of fluid and fetal movement were reviewed in detail with the patient. Please refer to After Visit Summary for other counseling recommendations.  Return in about 2 weeks (around 11/19/2017) for ROB.   Shelly Bombard, MD

## 2017-11-09 ENCOUNTER — Ambulatory Visit (INDEPENDENT_AMBULATORY_CARE_PROVIDER_SITE_OTHER): Payer: Medicaid Other

## 2017-11-09 ENCOUNTER — Inpatient Hospital Stay (HOSPITAL_COMMUNITY)
Admission: AD | Admit: 2017-11-09 | Discharge: 2017-11-09 | Disposition: A | Discharge status: Home / Self Care | Payer: Medicaid Other | Source: Ambulatory Visit | Attending: Obstetrics & Gynecology | Admitting: Obstetrics & Gynecology

## 2017-11-09 ENCOUNTER — Other Ambulatory Visit: Payer: Self-pay

## 2017-11-09 ENCOUNTER — Encounter (HOSPITAL_COMMUNITY): Payer: Self-pay | Admitting: *Deleted

## 2017-11-09 VITALS — BP 149/88 | HR 96 | Wt 197.8 lb

## 2017-11-09 DIAGNOSIS — O1490 Unspecified pre-eclampsia, unspecified trimester: Secondary | ICD-10-CM | POA: Insufficient documentation

## 2017-11-09 DIAGNOSIS — I10 Essential (primary) hypertension: Secondary | ICD-10-CM | POA: Diagnosis present

## 2017-11-09 DIAGNOSIS — O1493 Unspecified pre-eclampsia, third trimester: Secondary | ICD-10-CM

## 2017-11-09 DIAGNOSIS — O99213 Obesity complicating pregnancy, third trimester: Secondary | ICD-10-CM | POA: Insufficient documentation

## 2017-11-09 DIAGNOSIS — F329 Major depressive disorder, single episode, unspecified: Secondary | ICD-10-CM | POA: Insufficient documentation

## 2017-11-09 DIAGNOSIS — O99343 Other mental disorders complicating pregnancy, third trimester: Secondary | ICD-10-CM | POA: Insufficient documentation

## 2017-11-09 DIAGNOSIS — F419 Anxiety disorder, unspecified: Secondary | ICD-10-CM | POA: Diagnosis not present

## 2017-11-09 DIAGNOSIS — Z87891 Personal history of nicotine dependence: Secondary | ICD-10-CM | POA: Insufficient documentation

## 2017-11-09 DIAGNOSIS — O133 Gestational [pregnancy-induced] hypertension without significant proteinuria, third trimester: Secondary | ICD-10-CM

## 2017-11-09 DIAGNOSIS — Z3A36 36 weeks gestation of pregnancy: Secondary | ICD-10-CM | POA: Diagnosis not present

## 2017-11-09 LAB — COMPREHENSIVE METABOLIC PANEL
ALK PHOS: 109 U/L (ref 38–126)
ALT: 9 U/L — ABNORMAL LOW (ref 14–54)
ANION GAP: 11 (ref 5–15)
AST: 18 U/L (ref 15–41)
Albumin: 2.6 g/dL — ABNORMAL LOW (ref 3.5–5.0)
BILIRUBIN TOTAL: 0.4 mg/dL (ref 0.3–1.2)
BUN: 6 mg/dL (ref 6–20)
CALCIUM: 8.9 mg/dL (ref 8.9–10.3)
CO2: 21 mmol/L — ABNORMAL LOW (ref 22–32)
CREATININE: 0.57 mg/dL (ref 0.44–1.00)
Chloride: 102 mmol/L (ref 101–111)
GFR calc non Af Amer: >60 mL/min (ref >60)
Glucose, Bld: 71 mg/dL (ref 65–99)
Potassium: 3.4 mmol/L — ABNORMAL LOW (ref 3.5–5.1)
Sodium: 134 mmol/L — ABNORMAL LOW (ref 135–145)
TOTAL PROTEIN: 6.5 g/dL (ref 6.5–8.1)

## 2017-11-09 LAB — CBC
HEMATOCRIT: 29.3 % — AB (ref 36.0–46.0)
HEMOGLOBIN: 10.2 g/dL — AB (ref 12.0–15.0)
MCH: 31.5 pg (ref 26.0–34.0)
MCHC: 34.8 g/dL (ref 30.0–36.0)
MCV: 90.4 fL (ref 78.0–100.0)
Platelets: 264 10*3/uL (ref 150–400)
RBC: 3.24 MIL/uL — ABNORMAL LOW (ref 3.87–5.11)
RDW: 12 % (ref 11.5–15.5)
WBC: 7.1 10*3/uL (ref 4.0–10.5)

## 2017-11-09 LAB — PROTEIN / CREATININE RATIO, URINE
CREATININE, URINE: 203 mg/dL
Protein Creatinine Ratio: 0.12 mg/mg{Cre} (ref 0.00–0.15)
Total Protein, Urine: 25 mg/dL

## 2017-11-09 LAB — STREP GP B CULTURE+RFLX: STREP GP B CULTURE+RFLX: NEGATIVE

## 2017-11-09 MED ORDER — HYDRALAZINE HCL 20 MG/ML IJ SOLN: 10.0000 mg | Freq: Once | INTRAMUSCULAR | Status: DC | PRN

## 2017-11-09 MED ORDER — MAGNESIUM SULFATE 40 G IN LACTATED RINGERS - SIMPLE
2.0000 g/h | INTRAVENOUS | Status: DC
  Filled 2017-11-09: qty 500

## 2017-11-09 MED ORDER — MAGNESIUM SULFATE BOLUS VIA INFUSION
4.0000 g | Freq: Once | INTRAVENOUS | Status: AC
  Administered 2017-11-09: 4 g via INTRAVENOUS
  Filled 2017-11-09: qty 500

## 2017-11-09 MED ORDER — LACTATED RINGERS IV SOLN
INTRAVENOUS | Status: DC
  Administered 2017-11-09: 10:00:00 via INTRAVENOUS

## 2017-11-09 MED ORDER — LABETALOL HCL 5 MG/ML IV SOLN
20.0000 mg | INTRAVENOUS | Status: DC | PRN
  Administered 2017-11-09: 20 mg via INTRAVENOUS
  Filled 2017-11-09: qty 4

## 2017-11-09 MED ORDER — BETAMETHASONE SOD PHOS & ACET 6 (3-3) MG/ML IJ SUSP
12.0000 mg | Freq: Once | INTRAMUSCULAR | Status: AC
  Administered 2017-11-09: 12 mg via INTRAMUSCULAR
  Filled 2017-11-09: qty 2

## 2017-11-09 NOTE — MAU Note (Signed)
Pt sent from office for BP eval.  Pt denies H/A or epigastric pain.  Reports visual disturbances.

## 2017-11-09 NOTE — Progress Notes (Signed)
Pt transferred to [facility] in stable condition.

## 2017-11-09 NOTE — Progress Notes (Signed)
Nurse visit for NST only dx: GHTN. Pt c/o HA's worst R temple area. Consulted with provider, pt to MAU for further monitoring.

## 2017-11-09 NOTE — MAU Provider Note (Signed)
History     CSN: [id number]  Arrival date and time: 11/09/17 0900   First Provider Initiated Contact with Patient 11/09/17 [phone]      Chief Complaint  Patient presents with  . Hypertension   HPI Deanna Valencia is a 21 y.o. G1P0 at [redacted]w[redacted]d who presents from the office for BP evaluation. Patient recently diagnosed with [facility] & scheduled for IOL on 11/12/16. Was in office today for NST & had elevated BP with complaint of visual disturbance. She states she normally has headaches & floaters in her vision but these symptoms have gotten worse in the last week. Most recent headache lasted for 3 days & resolved yesterday without intervention. Denies epigastric pain, CP, or SOB. Positive fetal movement.   OB History    Gravida Para Term Preterm AB Living   1         0   SAB TAB Ectopic Multiple Live Births                  Past Medical History:  Diagnosis Date  . Allergy   . Anxiety   . Depression   . Eating disorder   . Foster care (status) 10/18/2013  . Morbid obesity ([place]) 06/15/2017  . UTI (urinary tract infection) 10/03/2013    History reviewed. No pertinent surgical history.  Family History  Problem Relation Age of Onset  . Hypertension Father   . Diabetes Paternal Grandfather   . Hypertension Paternal Grandfather   . Hearing loss Paternal Grandfather     Social History   Tobacco Use  . Smoking status: Former Smoker    Packs/day: 0.25    Years: 1.00    Pack years: 0.25    Types: Cigarettes  . Smokeless tobacco: Never Used  . Tobacco comment: states smokes occasionally not daily.. black and milds  Substance Use Topics  . Alcohol use: No    Alcohol/week: 0.0 oz    Comment: occasionally..07/22/16 quit a year ago... 12-16-16  wine   . Drug use: Yes    Types: Marijuana    Comment: last smoked right before preg confirmed.    Allergies:  Allergies  Allergen Reactions  . Amoxicillin Hives    Has patient had a PCN reaction causing immediate rash, facial/tongue/throat  swelling, SOB or lightheadedness with hypotension: no Has patient had a PCN reaction causing severe rash involving mucus membranes or skin necrosis: unknown Has patient had a PCN reaction that required hospitalization: no Has patient had a PCN reaction occurring within the last 10 years: no If all of the above answers are "NO", then may proceed with Cephalosporin use.   . Tomato Itching and Rash    Medications Prior to Admission  Medication Sig Dispense Refill Last Dose  . chlorpheniramine-HYDROcodone (TUSSIONEX PENNKINETIC ER) 10-8 MG/5ML SUER Take 5 mLs by mouth every 12 (twelve) hours as needed for cough. (Patient not taking: Reported on 11/03/2017) 115 mL 0 Not Taking  . guaiFENesin (MUCINEX) 600 MG 12 hr tablet Take 1 tablet (600 mg total) by mouth 2 (two) times daily. (Patient not taking: Reported on 11/03/2017) 20 tablet 1 Not Taking  . ondansetron (ZOFRAN) 8 MG tablet Take 1 tablet (8 mg total) by mouth every 8 (eight) hours as needed for nausea or vomiting. 40 tablet 2 Taking  . Prenatal Vit-Fe Phos-FA-Omega (VITAFOL GUMMIES) 3.33-0.333-34.8 MG CHEW Chew 3 tablets by mouth at bedtime. (Patient not taking: Reported on 11/01/2017) 90 tablet 12 Not Taking    Review of Systems  Constitutional: Negative.   Eyes: Positive for visual disturbance.  Gastrointestinal: Negative.   Genitourinary: Negative.   Neurological: Negative for headaches (none currently).   Physical Exam   Blood pressure (!) 146/94, pulse 86, temperature 98.3 F (36.8 C), temperature source Oral, resp. rate 20, height 5\' 1"  (1.549 m), weight 198 lb 4 oz (89.9 kg), last menstrual period 02/26/2017, SpO2 99 %. Patient Vitals for the past 24 hrs:  BP Temp Temp src Pulse Resp SpO2 Height Weight  11/09/17 1101 (!) 146/94 - - 86 - 99 % - -  11/09/17 1046 (!) 142/89 - - 72 - - - -  11/09/17 1041 - - - - - 99 % - -  11/09/17 1036 - - - - - 98 % - -  11/09/17 1031 (!) 158/89 - - 81 - 99 % - -  11/09/17 1016 (!) 152/98 -  - 78 - 100 % - -  11/09/17 1003 (!) 149/94 - - 76 - 97 % - -  11/09/17 1000 - - - - - 98 % - -  11/09/17 0946 (!) 165/103 - - 89 - - - -  11/09/17 0944 - - - - - 98 % - -  11/09/17 0939 - - - - - 99 % - -  11/09/17 0934 - - - - - 98 % - -  11/09/17 0931 (!) 157/99 - - 96 - - - -  11/09/17 0916 (!) 155/98 98.3 F (36.8 C) Oral 98 20 - - -  11/09/17 0915 (!) 163/107 - - (!) 103 - - - -  11/09/17 0914 - - - - - 99 % - -  11/09/17 9024 - - - - - - 5\' 1"  (1.549 m) 198 lb 4 oz (89.9 kg)    Physical Exam  Nursing note and vitals reviewed. Constitutional: She is oriented to person, place, and time. She appears well-developed and well-nourished. No distress.  HENT:  Head: Normocephalic and atraumatic.  Eyes: Conjunctivae are normal. Right eye exhibits no discharge. Left eye exhibits no discharge. No scleral icterus.  Neck: Normal range of motion.  Cardiovascular: Normal rate, regular rhythm and normal heart sounds.  No murmur heard. Respiratory: Effort normal and breath sounds normal. No respiratory distress. She has no wheezes.  GI: Soft. There is no tenderness.  Musculoskeletal: She exhibits no edema.  Neurological: She is alert and oriented to person, place, and time. She has normal reflexes.  No clonus  Skin: Skin is warm and dry. She is not diaphoretic.  Psychiatric: She has a normal mood and affect. Her behavior is normal. Judgment and thought content normal.    MAU Course  Procedures Results for orders placed or performed during the hospital encounter of 11/09/17 (from the past 24 hour(s))  Protein / creatinine ratio, urine     Status: None   Collection Time: 11/09/17  9:05 AM  Result Value Ref Range   Creatinine, Urine 203.00 mg/dL   Total Protein, Urine 25 mg/dL   Protein Creatinine Ratio 0.12 0.00 - 0.15 mg/mg[Cre]  CBC     Status: Abnormal   Collection Time: 11/09/17  9:17 AM  Result Value Ref Range   WBC 7.1 4.0 - 10.5 K/uL   RBC 3.24 (L) 3.87 - 5.11 MIL/uL    Hemoglobin 10.2 (L) 12.0 - 15.0 g/dL   HCT 29.3 (L) 36.0 - 46.0 %   MCV 90.4 78.0 - 100.0 fL   MCH 31.5 26.0 - 34.0 pg   MCHC 34.8 30.0 -  36.0 g/dL   RDW 12.0 11.5 - 15.5 %   Platelets 264 150 - 400 K/uL  Comprehensive metabolic panel     Status: Abnormal   Collection Time: 11/09/17  9:17 AM  Result Value Ref Range   Sodium 134 (L) 135 - 145 mmol/L   Potassium 3.4 (L) 3.5 - 5.1 mmol/L   Chloride 102 101 - 111 mmol/L   CO2 21 (L) 22 - 32 mmol/L   Glucose, Bld 71 65 - 99 mg/dL   BUN 6 6 - 20 mg/dL   Creatinine, Ser 0.57 0.44 - 1.00 mg/dL   Calcium 8.9 8.9 - 10.3 mg/dL   Total Protein 6.5 6.5 - 8.1 g/dL   Albumin 2.6 (L) 3.5 - 5.0 g/dL   AST 18 15 - 41 U/L   ALT 9 (L) 14 - 54 U/L   Alkaline Phosphatase 109 38 - 126 U/L   Total Bilirubin 0.4 0.3 - 1.2 mg/dL   GFR calc non Af Amer >60 >60 mL/min   GFR calc Af Amer >60 >60 mL/min   Anion gap 11 5 - 15    MDM NST:  Baseline: 145 bpm, Variability: Good {> 6 bpm), Accelerations: Reactive and Decelerations: Absent Severe range BP x 2. IV antihypertensive protocol ordered. Labetalol 20 mg IV given. BP responded-- remains elevated but no longer severe range CBC, CMP, urine PCR C/w Dr. Rosana Hoes. Reviewed labs & BPs. On unit to discuss POC with patient.   Assessment and Plan  A: 1. Pre-eclampsia in third trimester   2. [redacted] weeks gestation of pregnancy    P: Dr. Rosana Hoes at bedside speaking with patient  Jorje Guild 11/09/2017, 9:36 AM

## 2017-11-09 NOTE — Progress Notes (Signed)
Patient here for NST due to [facility]. Patient with elevated BP and headache since this am. NST stopped and patient sent to MAU for further evaluation. If discharged, patient scheduled for IOL on 1/3

## 2017-11-12 ENCOUNTER — Inpatient Hospital Stay (HOSPITAL_COMMUNITY): Admission: RE | Admit: 2017-11-12 | Payer: No Typology Code available for payment source | Source: Ambulatory Visit

## 2017-11-12 ENCOUNTER — Telehealth: Payer: Self-pay

## 2017-11-12 NOTE — Telephone Encounter (Signed)
Advised of results and rx sent, pt stated that she just delivered today in [place].

## 2017-11-23 ENCOUNTER — Ambulatory Visit: Payer: Medicaid Other

## 2017-11-23 VITALS — BP 147/90 | HR 65

## 2017-11-23 DIAGNOSIS — O133 Gestational [pregnancy-induced] hypertension without significant proteinuria, third trimester: Secondary | ICD-10-CM

## 2017-11-23 MED ORDER — AMLODIPINE BESYLATE 5 MG PO TABS: 5.0000 mg | ORAL_TABLET | Freq: Every day | ORAL | 0 refills | Status: DC

## 2017-11-23 NOTE — Progress Notes (Signed)
Agree with nursing staff's documentation of this patient's clinic encounter.  Patient has not taken procardia 30 BID since her discharge from the hospital on 11/15/2017. Patient expressed some concerns regarding taking the medication twice daily. Rx Norvasc 5 mg provided. Patient to follow up for postpartum visit and to monitor BP at home.  Mora Bellman, MD 11/23/2017 4:13 PM

## 2017-11-23 NOTE — Progress Notes (Signed)
Subjective:  Deanna Valencia is a 22 y.o. female with hypertension. Current Outpatient Medications  Medication Sig Dispense Refill  . chlorpheniramine-HYDROcodone (TUSSIONEX PENNKINETIC ER) 10-8 MG/5ML SUER Take 5 mLs by mouth every 12 (twelve) hours as needed for cough. (Patient not taking: Reported on 11/03/2017) 115 mL 0  . guaiFENesin (MUCINEX) 600 MG 12 hr tablet Take 1 tablet (600 mg total) by mouth 2 (two) times daily. (Patient not taking: Reported on 11/03/2017) 20 tablet 1  . ondansetron (ZOFRAN) 8 MG tablet Take 1 tablet (8 mg total) by mouth every 8 (eight) hours as needed for nausea or vomiting. 40 tablet 2  . Prenatal Vit-Fe Phos-FA-Omega (VITAFOL GUMMIES) 3.33-0.333-34.8 MG CHEW Chew 3 tablets by mouth at bedtime. (Patient not taking: Reported on 11/01/2017) 90 tablet 12   No current facility-administered medications for this visit.     Hypertension ROS: not taking medications regularly as instructed, no medication side effects noted, patient does not perform home BP monitoring, no TIA's, no chest pain on exertion, no dyspnea on exertion, no swelling of ankles, no orthostatic dizziness or lightheadedness, no orthopnea or paroxysmal nocturnal dyspnea, no palpitations, no intermittent claudication symptoms, no erectile dysfunction and pt has no complaints today doing fine Not taking BP medications. .  New concerns: none .   Objective:  LMP 02/26/2017   Appearance alert, well appearing, and in no distress. General exam BP noted to be well controlled today in office.    Assessment:   Hypertension Elevated consulted w/ Dr.Constant.   Plan:  Rx will be sent pt informed to take BP medication pt voiced understanding. Marland Kitchen

## 2017-12-17 ENCOUNTER — Encounter: Payer: Self-pay | Admitting: Obstetrics

## 2017-12-17 ENCOUNTER — Ambulatory Visit (INDEPENDENT_AMBULATORY_CARE_PROVIDER_SITE_OTHER): Payer: Medicaid Other | Admitting: Obstetrics

## 2017-12-17 DIAGNOSIS — Z30011 Encounter for initial prescription of contraceptive pills: Secondary | ICD-10-CM | POA: Diagnosis not present

## 2017-12-17 DIAGNOSIS — Z1389 Encounter for screening for other disorder: Secondary | ICD-10-CM

## 2017-12-17 DIAGNOSIS — Z3009 Encounter for other general counseling and advice on contraception: Secondary | ICD-10-CM

## 2017-12-17 MED ORDER — NORETHINDRONE 0.35 MG PO TABS: 1.0000 | ORAL_TABLET | Freq: Every day | ORAL | 11 refills | Status: DC

## 2017-12-17 NOTE — Progress Notes (Signed)
..  Post Partum ExamE  Deanna Valencia is a 22 y.o. G1P0 female who presents for a postpartum visit. She is 5 weeks postpartum following a 11/12/2017.  I have fully reviewed the prenatal and intrapartum course. The delivery was at 60 gestational weeks.  Anesthesia: Epidural . Postpartum course has been good. Baby's course has been good. Baby is feeding by breast. Bleeding none.. Bowel function is normal. Bladder function is normal. Patient is sexually active. Contraception method is going to be birth control pill for breast feeding. Postpartum depression screening:neg  The following portions of the patient's history were reviewed and updated as appropriate: allergies, current medications, past family history, past medical history, past social history, past surgical history and problem list. Last pap smear done 05-18-2017 and was Normal  Review of Systems A comprehensive review of systems was negative.    Objective:  Blood pressure 132/86, pulse 69, temperature 99.1 F (37.3 C), temperature source Oral, resp. rate 16, weight 181 lb 9.6 oz (82.4 kg), last menstrual period 02/26/2017, currently breastfeeding.  PE:  Deferred  Assessment:    1. Postpartum care following vaginal delivery  2. Encounter for other general counseling and advice on contraception  3. Encounter for initial prescription of contraceptive pills Rx: - norethindrone (MICRONOR,CAMILA,ERRIN) 0.35 MG tablet; Take 1 tablet (0.35 mg total) by mouth daily.  Dispense: 1 Package; Refill: 11  Plan:   1. Contraception: oral progesterone-only contraceptive 2. Micronor Rx 3. Follow up in: 4 weeks or as needed.   Shelly Bombard MD

## 2018-01-14 ENCOUNTER — Encounter: Payer: Self-pay | Admitting: Obstetrics

## 2018-01-14 ENCOUNTER — Ambulatory Visit (INDEPENDENT_AMBULATORY_CARE_PROVIDER_SITE_OTHER): Payer: Medicaid Other | Admitting: Obstetrics

## 2018-01-14 DIAGNOSIS — Z3041 Encounter for surveillance of contraceptive pills: Secondary | ICD-10-CM | POA: Diagnosis not present

## 2018-01-14 NOTE — Progress Notes (Signed)
Subjective:     Deanna Valencia is a 22 y.o. female who presents for a postpartum visit. She is 10 weeks postpartum following a spontaneous vaginal delivery. I have fully reviewed the prenatal and intrapartum course. The delivery was at 60 gestational weeks. Outcome: spontaneous vaginal delivery. Anesthesia: epidural. Postpartum course has been normal. Baby's course has been normal. Baby is feeding by breast. Bleeding no bleeding. Bowel function is normal. Bladder function is normal. Patient is sexually active. Contraception method is oral progesterone-only contraceptive. Postpartum depression screening: negative.  Tobacco, alcohol and substance abuse history reviewed.  Adult immunizations reviewed including TDAP, rubella and varicella.  The following portions of the patient's history were reviewed and updated as appropriate: allergies, current medications, past family history, past medical history, past social history, past surgical history and problem list.  Review of Systems A comprehensive review of systems was negative.   Objective:    BP 134/80   Pulse 73   Wt 188 lb (85.3 kg)   LMP 12/30/2017 (Approximate)   Breastfeeding? Yes   BMI 35.52 kg/m   General:  alert and no distress   Breasts:  inspection negative, no nipple discharge or bleeding, no masses or nodularity palpable  Lungs: clear to auscultation bilaterally  Heart:  regular rate and rhythm, S1, S2 normal, no murmur, click, rub or gallop  Abdomen: soft, non-tender; bowel sounds normal; no masses,  no organomegaly   Vulva:  normal  Vagina: normal vagina  Cervix:  no cervical motion tenderness  Corpus: normal size, contour, position, consistency, mobility, non-tender  Adnexa:  no mass, fullness, tenderness  Rectal Exam: Not performed.          50% of 15 min visit spent on counseling and coordination of care.   Assessment:     1. Postpartum care following vaginal delivery - DOING WELL  2. Encounter for surveillance  of contraceptive pills - plans to breastfeed for ~ 1 year - will continue Micronor as long as breastfeeding   Plan:    1. Contraception: oral progesterone-only contraceptive 2. Continue PNV's 3. Follow up in: 6 months or as needed.    Shelly Bombard MD

## 2018-01-14 NOTE — Progress Notes (Signed)
presents for FU.  Had 5 weeks PP visit 12/17/17.  Got OCP.  No problems today.

## 2018-04-20 ENCOUNTER — Emergency Department (HOSPITAL_COMMUNITY)
Admission: EM | Admit: 2018-04-20 | Discharge: 2018-04-20 | Disposition: A | Discharge status: Home / Self Care | Payer: Medicaid Other | Attending: Emergency Medicine | Admitting: Emergency Medicine

## 2018-04-20 ENCOUNTER — Other Ambulatory Visit: Payer: Self-pay

## 2018-04-20 ENCOUNTER — Encounter (HOSPITAL_COMMUNITY): Payer: Self-pay | Admitting: Emergency Medicine

## 2018-04-20 DIAGNOSIS — J029 Acute pharyngitis, unspecified: Secondary | ICD-10-CM | POA: Diagnosis present

## 2018-04-20 DIAGNOSIS — Z5321 Procedure and treatment not carried out due to patient leaving prior to being seen by health care provider: Secondary | ICD-10-CM | POA: Diagnosis not present

## 2018-04-20 LAB — GROUP A STREP BY PCR: Group A Strep by PCR: NOT DETECTED

## 2018-04-20 NOTE — ED Notes (Signed)
Pt walks to the this RN in another and says "ma'am I have to go, I can't wait." EDP notified.

## 2018-04-20 NOTE — ED Triage Notes (Signed)
Pt. Stated, I was dex in December with tonsillitis and that's probably what it is. It got better and now its come back.

## 2018-07-07 ENCOUNTER — Ambulatory Visit (INDEPENDENT_AMBULATORY_CARE_PROVIDER_SITE_OTHER): Payer: Medicaid Other

## 2018-07-07 DIAGNOSIS — Z3202 Encounter for pregnancy test, result negative: Secondary | ICD-10-CM

## 2018-07-07 DIAGNOSIS — N926 Irregular menstruation, unspecified: Secondary | ICD-10-CM

## 2018-07-07 LAB — POCT URINE PREGNANCY: Preg Test, Ur: NEGATIVE

## 2018-07-07 NOTE — Progress Notes (Signed)
Deanna Valencia presents today for UPT. She has no unusual complaints. LMP: 05/30/18    OBJECTIVE: Appears well, in no apparent distress.  OB History    Gravida  1   Para      Term      Preterm      AB      Living  1     SAB      TAB      Ectopic      Multiple      Live Births  1          Home UPT Result: Did not take one In-Office UPT result: Neg  I have reviewed the patient's medical, obstetrical, social, and family histories, and medications.   ASSESSMENT: Negative pregnancy test  PLAN Prenatal care to be completed at:   Pt to repeat UPT in 2 weeks.

## 2018-07-09 NOTE — Progress Notes (Signed)
Agree with A & P. 

## 2018-10-03 ENCOUNTER — Other Ambulatory Visit: Payer: Self-pay

## 2018-10-03 ENCOUNTER — Encounter (HOSPITAL_COMMUNITY): Payer: Self-pay | Admitting: Oncology

## 2018-10-03 ENCOUNTER — Emergency Department (HOSPITAL_COMMUNITY)
Admission: EM | Admit: 2018-10-03 | Discharge: 2018-10-03 | Disposition: A | Discharge status: Home / Self Care | Payer: Medicaid Other | Attending: Emergency Medicine | Admitting: Emergency Medicine

## 2018-10-03 DIAGNOSIS — I10 Essential (primary) hypertension: Secondary | ICD-10-CM | POA: Diagnosis not present

## 2018-10-03 DIAGNOSIS — E876 Hypokalemia: Secondary | ICD-10-CM | POA: Diagnosis not present

## 2018-10-03 DIAGNOSIS — L0291 Cutaneous abscess, unspecified: Secondary | ICD-10-CM

## 2018-10-03 DIAGNOSIS — Z79899 Other long term (current) drug therapy: Secondary | ICD-10-CM | POA: Diagnosis not present

## 2018-10-03 DIAGNOSIS — Z87891 Personal history of nicotine dependence: Secondary | ICD-10-CM | POA: Diagnosis not present

## 2018-10-03 DIAGNOSIS — M79662 Pain in left lower leg: Secondary | ICD-10-CM | POA: Diagnosis present

## 2018-10-03 DIAGNOSIS — L02416 Cutaneous abscess of left lower limb: Secondary | ICD-10-CM | POA: Insufficient documentation

## 2018-10-03 LAB — BASIC METABOLIC PANEL
Anion gap: 8 (ref 5–15)
BUN: 9 mg/dL (ref 6–20)
CHLORIDE: 105 mmol/L (ref 98–111)
CO2: 25 mmol/L (ref 22–32)
CREATININE: 0.77 mg/dL (ref 0.44–1.00)
Calcium: 9 mg/dL (ref 8.9–10.3)
GFR calc Af Amer: >60 mL/min (ref >60)
GFR calc non Af Amer: >60 mL/min (ref >60)
GLUCOSE: 97 mg/dL (ref 70–99)
POTASSIUM: 3.2 mmol/L — AB (ref 3.5–5.1)
Sodium: 138 mmol/L (ref 135–145)

## 2018-10-03 LAB — CBC WITH DIFFERENTIAL/PLATELET
ABS IMMATURE GRANULOCYTES: 0.01 10*3/uL (ref 0.00–0.07)
BASOS PCT: 0 %
Basophils Absolute: 0 10*3/uL (ref 0.0–0.1)
Eosinophils Absolute: 0.1 10*3/uL (ref 0.0–0.5)
Eosinophils Relative: 2 %
HCT: 39.2 % (ref 36.0–46.0)
Hemoglobin: 12.8 g/dL (ref 12.0–15.0)
Immature Granulocytes: 0 %
Lymphocytes Relative: 25 %
Lymphs Abs: 1.7 10*3/uL (ref 0.7–4.0)
MCH: 30 pg (ref 26.0–34.0)
MCHC: 32.7 g/dL (ref 30.0–36.0)
MCV: 91.8 fL (ref 80.0–100.0)
MONO ABS: 0.6 10*3/uL (ref 0.1–1.0)
MONOS PCT: 10 %
NEUTROS ABS: 4.2 10*3/uL (ref 1.7–7.7)
Neutrophils Relative %: 63 %
PLATELETS: 243 10*3/uL (ref 150–400)
RBC: 4.27 MIL/uL (ref 3.87–5.11)
RDW: 12 % (ref 11.5–15.5)
WBC: 6.6 10*3/uL (ref 4.0–10.5)
nRBC: 0 % (ref 0.0–0.2)

## 2018-10-03 MED ORDER — CLINDAMYCIN HCL 300 MG PO CAPS: 300.0000 mg | ORAL_CAPSULE | Freq: Three times a day (TID) | ORAL | 0 refills | Status: AC

## 2018-10-03 MED ORDER — LIDOCAINE-EPINEPHRINE (PF) 2 %-1:200000 IJ SOLN
10.0000 mL | Freq: Once | INTRAMUSCULAR | Status: DC
  Filled 2018-10-03: qty 10

## 2018-10-03 MED ORDER — ACETAMINOPHEN 325 MG PO TABS: 650.0000 mg | ORAL_TABLET | Freq: Once | ORAL | Status: DC

## 2018-10-03 NOTE — ED Triage Notes (Signed)
Pt presents d/t LLE pain.  LLE noted to have multiple abscess to anterior calf.  Areas are warm and hard. Pt states leg has been sore for approximately one month however became exceptionally painful over the last week.  Denies fever or chills.  +nausea.

## 2018-10-03 NOTE — ED Provider Notes (Signed)
[facility] EMERGENCY DEPARTMENT Provider Note   CSN: [id number] Arrival date & time: 10/03/18  0557     History   Chief Complaint Chief Complaint  Patient presents with  . Cellulitis    HPI Deanna Valencia is a 22 y.o. female.  HPI   Patient is a 22 year old female with a history of anxiety/depression, eating disorder, foster care, morbid obesity, urinary tract infection, who presents to the emergency department today for evaluation of left lower extremity pain and possible cellulitis.  Patient states that for the last month she has had pain to the left shin.  Several days ago that she developed redness and what looks like several boils to the anterior aspect of the left leg.  She states that 1 of the wounds has drained and seems to be resolving.  She denies fevers or chills.  She denies other systemic symptoms.  Denies history of diabetes, HIV, IV drug use or immunocompromise state.  States that she had hypertension while pregnant and delivered 10 months ago.  She states she is supposed to be on blood pressure medications but has not been taking them.  No chest pain, shortness of breath or headaches.  Past Medical History:  Diagnosis Date  . Allergy   . Anxiety   . Depression   . Eating disorder   . Foster care (status) 10/18/2013  . Morbid obesity ([place]) 06/15/2017  . UTI (urinary tract infection) 10/03/2013    Patient Active Problem List   Diagnosis Date Noted  . Gestational hypertension, third trimester 10/27/2017  . Anemia in pregnancy, third trimester 09/03/2017  . Morbid obesity ([place]) 06/15/2017  . Trichomonal vaginitis during pregnancy in second trimester 05/29/2017  . Maternal varicella, non-immune 05/19/2017  . Rubella non-immune status, antepartum 05/19/2017  . Supervision of normal first pregnancy, antepartum 05/18/2017  . Uterine fibroid 05/18/2017  . Dermoid cyst of ovary 05/18/2017  . Deliberate self-cutting 05/18/2017  . 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 Borderline personality disorder ([place]) 03/01/2015  . Generalized anxiety disorder 03/01/2015  . 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 PTSD (post-traumatic stress disorder) 03/01/2015    History reviewed. No pertinent surgical history.   OB History    Gravida  1   Para      Term      Preterm      AB      Living  1     SAB      TAB      Ectopic      Multiple      Live Births  1            Home Medications    Prior to Admission medications   Medication Sig Start Date End Date Taking? Authorizing Provider  amLODipine (NORVASC) 5 MG tablet Take 1 tablet (5 mg total) by mouth daily. Patient not taking: Reported on 12/17/2017 11/23/17   Constant, Peggy, MD  clindamycin (CLEOCIN) 300 MG capsule Take 1 capsule (300 mg total) by mouth 3 (three) times daily for 7 days. 10/03/18 10/10/18  Jacqualin Shirkey S, PA-C  norethindrone (MICRONOR,CAMILA,ERRIN) 0.35 MG tablet Take 1 tablet (0.35 mg total) by mouth daily. Patient not taking: Reported on 10/03/2018 12/17/17   Shelly Bombard, MD  ondansetron (ZOFRAN) 8 MG tablet Take 1 tablet (8 mg total) by mouth every 8 (eight) hours as needed for nausea or vomiting. Patient not taking: Reported on 12/17/2017 10/29/17   Morene Crocker, CNM  Prenatal Vit-Fe Phos-FA-Omega (VITAFOL GUMMIES) 3.33-0.333-34.8 MG CHEW Chew 3 tablets by mouth at bedtime. Patient not taking: Reported on 10/03/2018 05/18/17   Morene Crocker, CNM    Family History Family History  Problem Relation Age of Onset  . Hypertension Father   . Diabetes Paternal Grandfather   . Hypertension Paternal Grandfather   . Hearing loss Paternal Grandfather     Social History Social History   Tobacco Use  . Smoking status: Former Smoker    Packs/day: 0.25    Years: 1.00    Pack years: 0.25    Types: Cigarettes  . Smokeless tobacco: Never Used  . Tobacco comment: states smokes occasionally not  daily.. black and milds  Substance Use Topics  . Alcohol use: No    Alcohol/week: 0.0 standard drinks    Comment: occasionally..07/22/16 quit a year ago... 12-16-16  wine   . Drug use: Yes    Types: Marijuana    Comment: last smoked right before preg confirmed.     Allergies   Amoxicillin and Tomato   Review of Systems Review of Systems  Constitutional: Negative for chills and fever.  HENT: Negative for congestion.   Eyes: Negative for visual disturbance.  Respiratory: Negative for shortness of breath.   Cardiovascular: Negative for chest pain.  Gastrointestinal: Negative for abdominal pain, constipation, diarrhea, nausea and vomiting.  Genitourinary: Negative for dysuria.  Musculoskeletal:       LLE pain, no calf pain  Skin: Positive for color change and wound.  Neurological: Negative for headaches.     Physical Exam Updated Vital Signs BP (!) 173/123 (BP Location: Right Arm)   Pulse 81   Temp (!) 97.5 F (36.4 C) (Oral)   Resp 13   Ht 5' (1.524 m)   Wt 89.8 kg   LMP 09/19/2018 (Approximate)   SpO2 99%   BMI 38.67 kg/m   Physical Exam  Constitutional: She appears well-developed and well-nourished. No distress.  Nontoxic appearing  HENT:  Head: Normocephalic and atraumatic.  Eyes: Conjunctivae are normal.  Neck: Neck supple.  Cardiovascular: Normal rate, regular rhythm and normal heart sounds.  No murmur heard. Pulmonary/Chest: Effort normal and breath sounds normal. No respiratory distress.  Abdominal: Soft. There is no tenderness.  Musculoskeletal:  three areas of fluctuance to left shin that are TTP with surrounding erythema and mild induration. Areas of fluctuance are about 1cm each. No active drainage. No calf ttp or edema. No evidence of erythema tracking up the leg.   Neurological: She is alert.  Skin: Skin is warm and dry.  Psychiatric: She has a normal mood and affect.  Nursing note and vitals reviewed.     ED Treatments / Results  Labs (all  labs ordered are listed, but only abnormal results are displayed) Labs Reviewed  BASIC METABOLIC PANEL - Abnormal; Notable for the following components:      Result Value   Potassium 3.2 (*)    All other components within normal limits  CBC WITH DIFFERENTIAL/PLATELET    EKG None  Radiology No results found.  Procedures .Marland KitchenIncision and Drainage Date/Time: 10/03/2018 7:40 AM Performed by: Rodney Booze, PA-C Authorized by: Rodney Booze, PA-C   Consent:    Consent obtained:  Verbal   Consent given by:  Patient   Risks discussed:  Incomplete drainage, pain and bleeding Location:    Type:  Abscess   Size:  1cm   Location:  Lower extremity   Lower extremity location:  Leg  Leg location:  L lower leg Pre-procedure details:    Skin preparation:  Betadine Anesthesia (see MAR for exact dosages):    Anesthesia method:  Local infiltration   Local anesthetic:  Lidocaine 2% WITH epi Procedure type:    Complexity:  Simple Procedure details:    Incision types:  Stab incision   Incision depth:  Dermal   Scalpel blade:  11   Wound management:  Probed and deloculated and irrigated with saline   Drainage:  Purulent   Drainage amount:  Scant   Wound treatment:  Wound left open Post-procedure details:    Patient tolerance of procedure:  Tolerated well, no immediate complications .Marland KitchenIncision and Drainage Date/Time: 10/03/2018 7:40 AM Performed by: Rodney Booze, PA-C Authorized by: Rodney Booze, PA-C   Consent:    Consent obtained:  Verbal   Consent given by:  Patient   Risks discussed:  Incomplete drainage, pain and bleeding Location:    Type:  Abscess   Size:  1cm   Location:  Lower extremity   Lower extremity location:  Leg   Leg location:  L lower leg Pre-procedure details:    Skin preparation:  Betadine Anesthesia (see MAR for exact dosages):    Anesthesia method:  Local infiltration   Local anesthetic:  Lidocaine 2% WITH epi Procedure type:     Complexity:  Simple Procedure details:    Incision types:  Stab incision   Scalpel blade:  11   Wound management:  Irrigated with saline   Drainage:  Purulent   Drainage amount:  Scant   Wound treatment:  Wound left open   Packing materials:  None Post-procedure details:    Patient tolerance of procedure:  Tolerated well, no immediate complications .Marland KitchenIncision and Drainage Date/Time: 10/03/2018 7:41 AM Performed by: Rodney Booze, PA-C Authorized by: Rodney Booze, PA-C   Consent:    Consent obtained:  Verbal   Consent given by:  Patient   Risks discussed:  Bleeding, incomplete drainage and pain Location:    Type:  Abscess   Size:  .5cm   Location:  Lower extremity   Lower extremity location:  Leg   Leg location:  L lower leg Pre-procedure details:    Skin preparation:  Betadine Anesthesia (see MAR for exact dosages):    Anesthesia method:  Local infiltration   Local anesthetic:  Lidocaine 2% WITH epi Procedure type:    Complexity:  Simple Procedure details:    Incision types:  Stab incision   Scalpel blade:  11   Wound management:  Irrigated with saline   Drainage:  Purulent   Drainage amount:  Scant   Wound treatment:  Wound left open Post-procedure details:    Patient tolerance of procedure:  Tolerated well, no immediate complications   (including critical care time)  Medications Ordered in ED Medications  lidocaine-EPINEPHrine (XYLOCAINE W/EPI) 2 %-1:200000 (PF) injection 10 mL (has no administration in time range)  acetaminophen (TYLENOL) tablet 650 mg (has no administration in time range)     Initial Impression / Assessment and Plan / ED Course  I have reviewed the triage vital signs and the nursing notes.  Pertinent labs & imaging results that were available during my care of the patient were reviewed by me and considered in my medical decision making (see chart for details).      Final Clinical Impressions(s) / ED Diagnoses   Final  diagnoses:  Abscess  Hypertension, unspecified type  Hypokalemia   Pt presenting with several  abscesses to the lle extremity that have been present for the last several days. Denies immunocompromised state. States she uses NARE which I suspect is what caused her abscesses. Advised her to discontinue this. No fevers at home or in the ed. Other than her hypertension her vitals are reassuring. She has no cp, sob, or ha to suggest htn emergency.   Labs without leukocytosis. BMP with mild hypokalemia, normal glucose. Advised her to increase potassium intake in diet and f/u with pcp for repeat labs.  Three abscesses were incised and drained. No signs of sepsis in the ED. Pt felt to be appropriate for outpatient antibiotic therapy. placed on po antibiotics and advised to f/u in 48 hours for recheck. Discussed case with pharmacy who agreed clindamycin is appropriate MRSA coverage for this breastfeeding patient. Advised to continue her antihypertensives at home. Advised her to make appt with pcp for recheck and to return to the ed sooner for any new or worsening symptoms including signs of worsening infection. She voices an understanding of the plan and reason to return. All questions answered.   ED Discharge Orders         Ordered    clindamycin (CLEOCIN) 300 MG capsule  3 times daily     10/03/18 0736           Rodney Booze, PA-C 10/03/18 0742    Fatima Blank, MD 10/03/18 [phone]

## 2018-10-03 NOTE — Discharge Instructions (Addendum)
You were given a prescription for antibiotics. Please take the antibiotic prescription fully.   Please complete warm soaks at home to encourage drainage from the wounds.  Please follow up in the emergency department, at urgent care, or with your primary care doctor in 48 hours for reevaluation of your symptoms.   Please return to the emergency room immediately if you experience any new or worsening symptoms or any symptoms that indicate worsening infection such as fevers, increased redness/swelling/pain, warmth, or drainage from the affected area.

## 2018-10-30 IMAGING — CT CT NECK W/ CM
5 of 6 series · 14 of 33 positions shown, 16 images · IV contrast (APPLIED)
Comparison: None.

CLINICAL DATA: Sore throat, neck swelling, difficulty swallowing.

EXAM:
CT NECK WITH CONTRAST
TECHNIQUE: Multidetector CT imaging of the neck was performed using the
standard protocol following the bolus administration of intravenous
contrast.
CONTRAST:  75mL [id number] IOPAMIDOL ([id number]) INJECTION 61%

[Series 3: axial neck · axial · 0.47mm/px · z∈[-258,-184]mm · 2 of 112 slices shown, 3 images]
[im 38/112  soft-tissue]
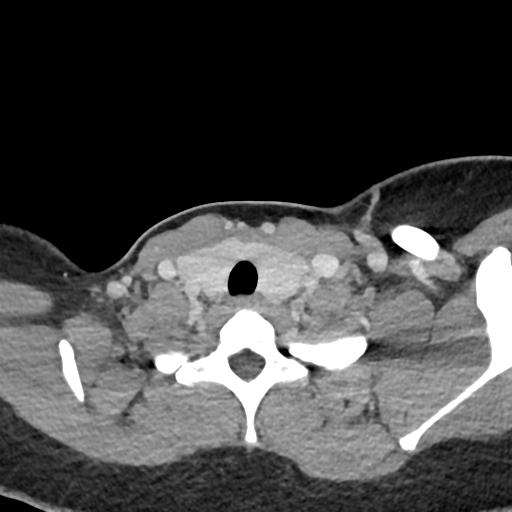
[im 38/112  bone]
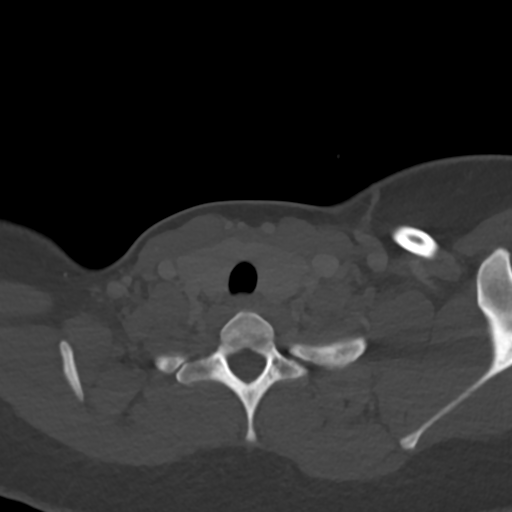
[im 75/112  bone]
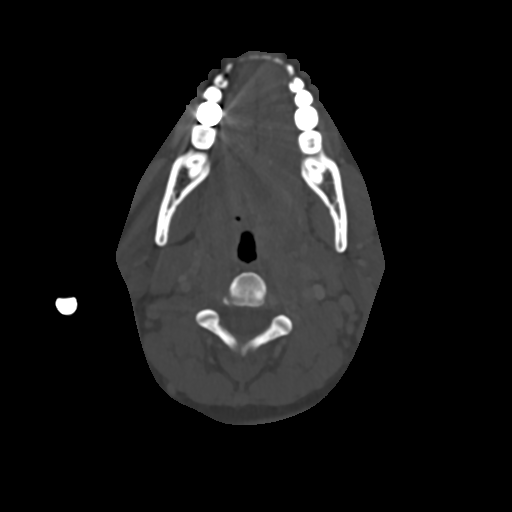

[Series 5: axial bone · axial · 0.47mm/px · z∈[-258,-184]mm · 2 of 112 slices shown]
[im 38/112  bone]
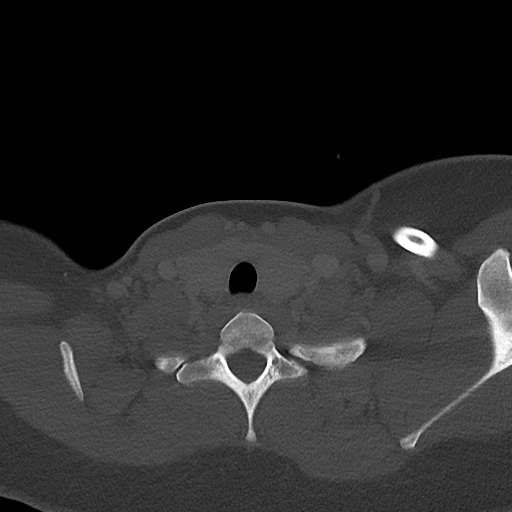
[im 75/112  bone]
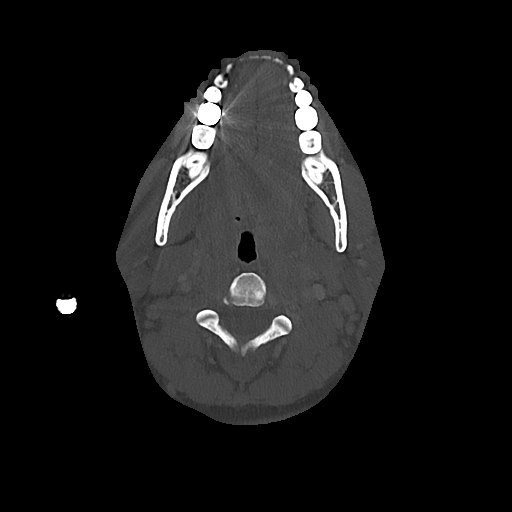

[Series 6: sag neck · sagittal · 0.45mm/px · 5 of 80 slices shown, 6 images]
[im 27/80  bone]
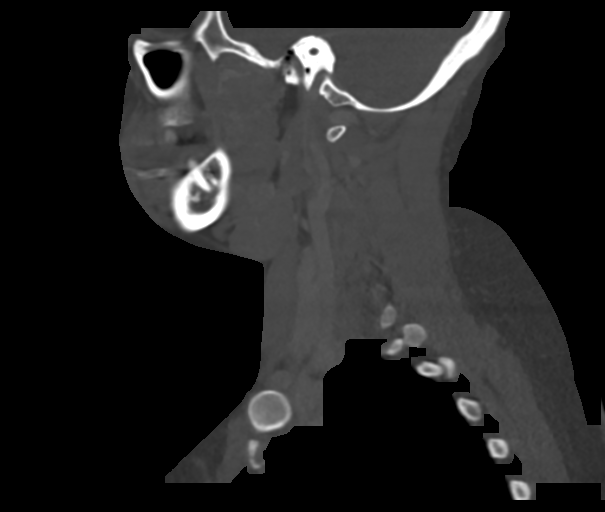
[im 33/80  bone]
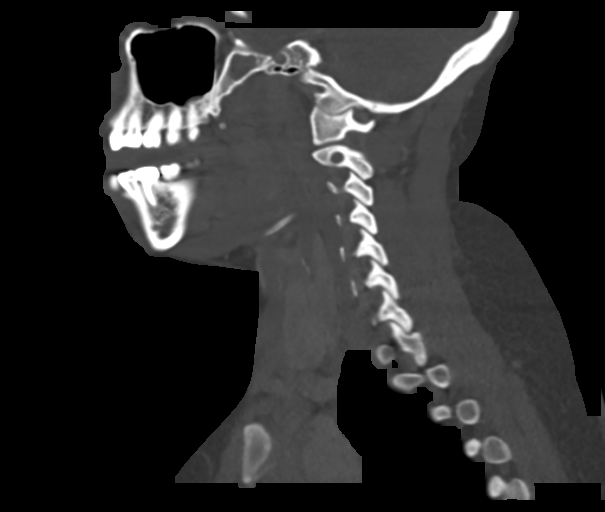
[im 40/80  soft-tissue]
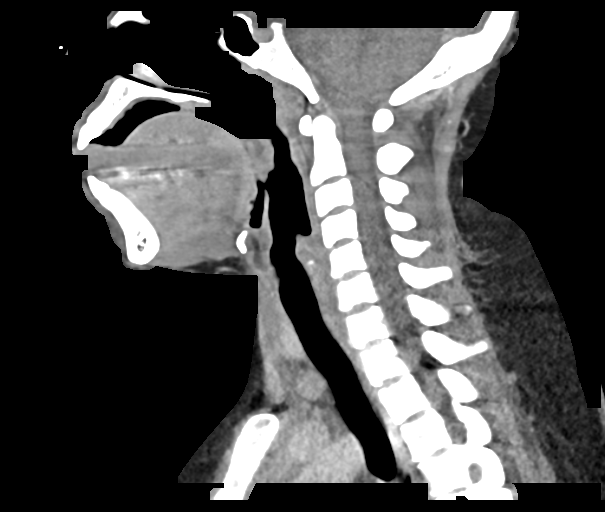
[im 40/80  bone]
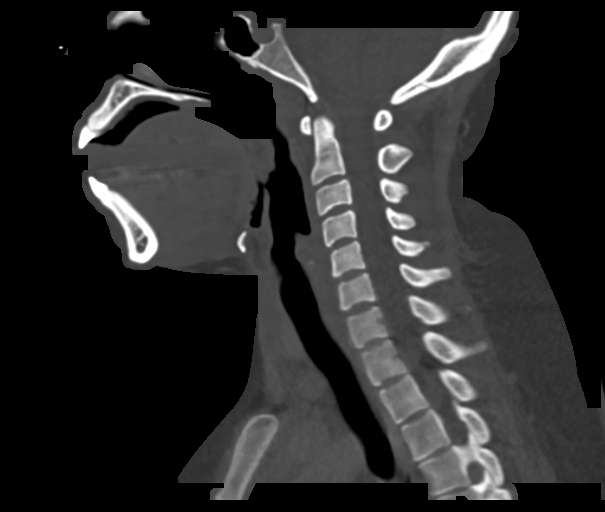
[im 47/80  bone]
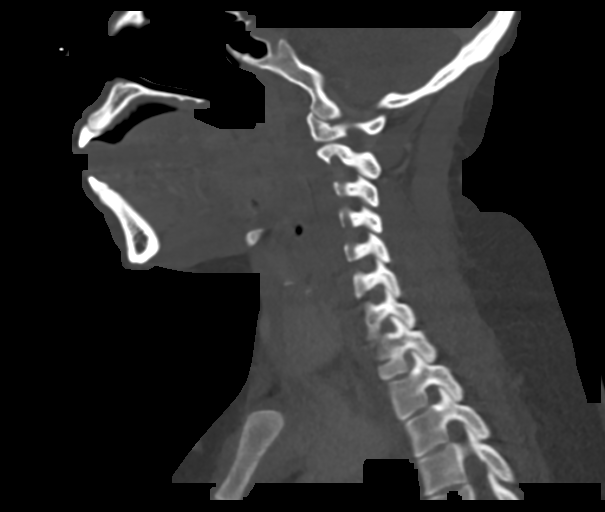
[im 53/80  bone]
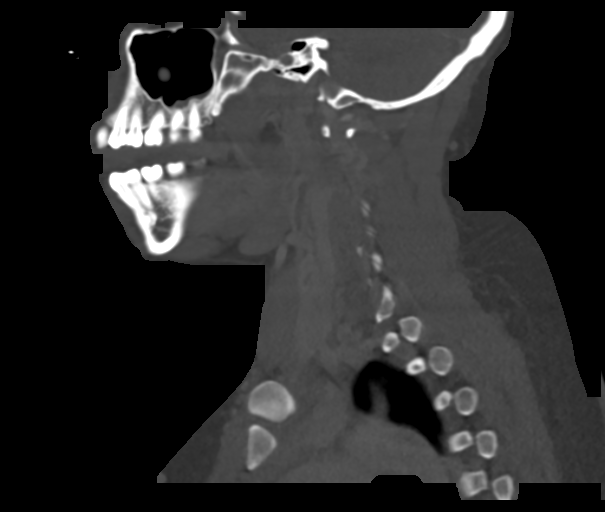

[Series 7: cor neck · coronal · 0.47mm/px · 3 of 102 slices shown]
[im 21/102  bone]
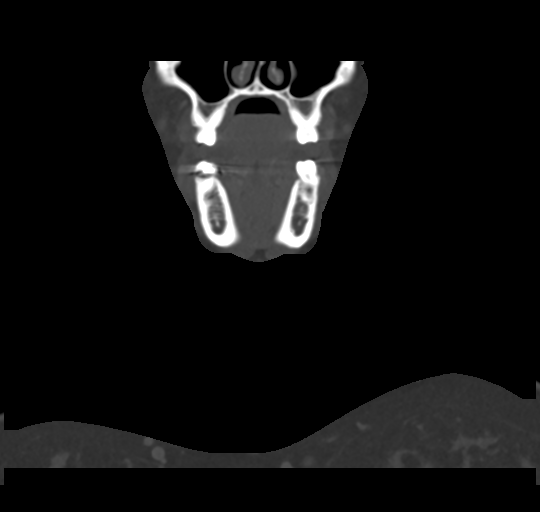
[im 41/102  bone]
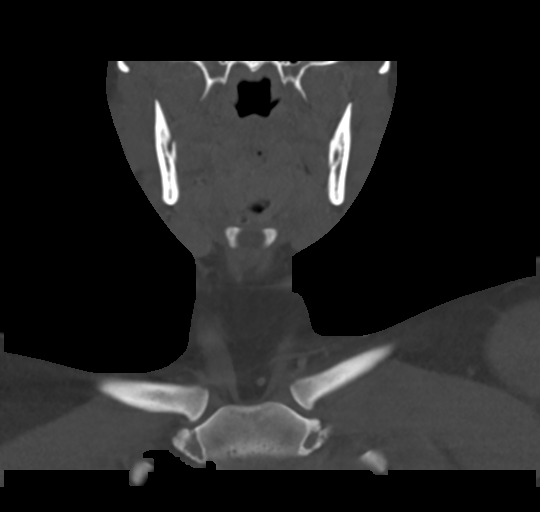
[im 61/102  bone]
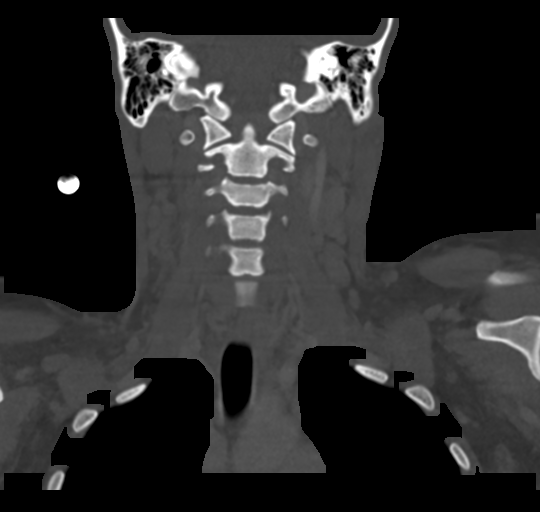

[Series 8: ax oropharynx · axial · 0.47mm/px · z∈[-270,-194]mm · 2 of 115 slices shown]
[im 39/115  bone]
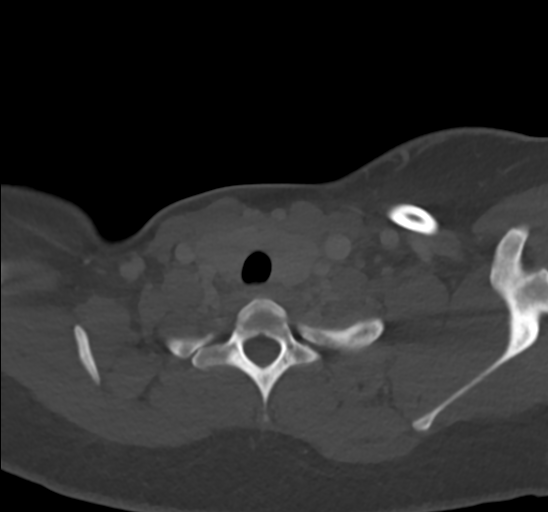
[im 77/115  bone]
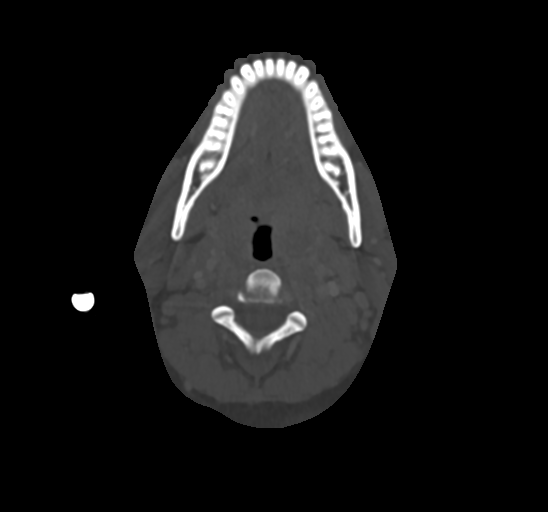

[14 of 33 positions shown; findings below may reference images not displayed]

FINDINGS: Pharynx and larynx: BILATERAL nasopharyngeal adenoidal hypertrophy.
BILATERAL tonsillar enlargement and enhancement, mild edema in the
parapharyngeal soft tissues, but no tonsillar or peritonsillar
abscess. Edema tracks downward along the LEFT greater than RIGHT
aryepiglottic folds. Normal larynx. Normal epiglottis. Trace
retropharyngeal effusion.

Salivary glands: No inflammation, mass, or stone.

Thyroid: Upper limits normal of size.  No focal areas of nodularity.

Lymph nodes: BILATERAL reactive cervical lymphadenopathy, greatest
at level II.

Vascular: Vessels are patent.

Limited intracranial: Negative.

Visualized orbits: Negative.

Mastoids and visualized paranasal sinuses: Clear.

Skeleton: No acute or aggressive process.

Upper chest: Negative.

Other: None.
IMPRESSION: BILATERAL nasopharyngeal adenoidal and tonsillar inflammation,
consistent with tonsillitis, without tonsillar or peritonsillar
abscess. No airway compromise. Normal epiglottis and larynx.

BILATERAL reactive lymphadenopathy.

## 2019-02-10 ENCOUNTER — Ambulatory Visit: Payer: Self-pay | Admitting: Certified Nurse Midwife

## 2019-07-13 ENCOUNTER — Encounter: Payer: Self-pay | Admitting: Obstetrics and Gynecology

## 2019-07-13 ENCOUNTER — Ambulatory Visit (INDEPENDENT_AMBULATORY_CARE_PROVIDER_SITE_OTHER): Payer: Medicaid Other | Admitting: Obstetrics and Gynecology

## 2019-07-13 ENCOUNTER — Other Ambulatory Visit: Payer: Self-pay

## 2019-07-13 VITALS — BP 151/97 | HR 83 | Wt 171.0 lb

## 2019-07-13 DIAGNOSIS — D279 Benign neoplasm of unspecified ovary: Secondary | ICD-10-CM | POA: Diagnosis not present

## 2019-07-13 DIAGNOSIS — N938 Other specified abnormal uterine and vaginal bleeding: Secondary | ICD-10-CM | POA: Diagnosis not present

## 2019-07-13 DIAGNOSIS — R102 Pelvic and perineal pain: Secondary | ICD-10-CM | POA: Insufficient documentation

## 2019-07-13 DIAGNOSIS — I10 Essential (primary) hypertension: Secondary | ICD-10-CM | POA: Diagnosis not present

## 2019-07-13 MED ORDER — TRAMADOL HCL 50 MG PO TABS: 50.0000 mg | ORAL_TABLET | Freq: Four times a day (QID) | ORAL | 0 refills | Status: DC | PRN

## 2019-07-13 NOTE — Progress Notes (Signed)
Patient ID: Deanna Valencia, female   DOB: February 16, 1996, 23 y.o.   MRN: [id number] [name] presents with c/o plvic pain since April. H/O dermoid cyst during last pregnancy. No follow up U/S. Pt stopped breast feeding in Jan. Cycles since are q 22-24 days, last 4-7 days, heavier and more cramps Sexual active without contraception Some pain with IC at times H/O [facility], was taking Norvasc in the past.  PE AF VSS Lungs clear Heart RRR Abd soft + BS GU nl EGBUS, bladder non tender uterus 10-12 weeks, right adnexal fullnes  A/P Pelvic pain        H/O right dermoid cyst        Dysmenorrhea        H/O uterine fibroids        Hypertension  Will check labs. GYN U/S. List of PCP;s for eval and management of HTN. Tramadol for pain F/U per test results.

## 2019-07-13 NOTE — Progress Notes (Signed)
Pt is here with c/o abdominal pain. Pt reports she started getting her period again in January of this year after having a baby last year. Pt reports that she gets lower abdominal pain on both sides during her period, rates pain 7 out of 10. Pt denies heavy bleeding. Pt reports that she has not tried to take any medication for the pain. Pt is currently SA, she is not interested in contraception at this time.

## 2019-07-13 NOTE — Patient Instructions (Signed)
Pelvic Pain, Female Pelvic pain is pain in your lower belly (abdomen), below your belly button and between your hips. The pain may start suddenly (be acute), keep coming back (be recurring), or last a long time (become chronic). Pelvic pain that lasts longer than 6 months is called chronic pelvic pain. There are many causes of pelvic pain. Sometimes the cause of pelvic pain is not known. Follow these instructions at home:   Take over-the-counter and prescription medicines only as told by your doctor.  Rest as told by your doctor.  Do not have sex if it hurts.  Keep a journal of your pelvic pain. Write down: ? When the pain started. ? Where the pain is located. ? What seems to make the pain better or worse, such as food or your period (menstrual cycle). ? Any symptoms you have along with the pain.  Keep all follow-up visits as told by your doctor. This is important. Contact a doctor if:  Medicine does not help your pain.  Your pain comes back.  You have new symptoms.  You have unusual discharge or bleeding from your vagina.  You have a fever or chills.  You are having trouble pooping (constipation).  You have blood in your pee (urine) or poop (stool).  Your pee smells bad.  You feel weak or light-headed. Get help right away if:  You have sudden pain that is very bad.  Your pain keeps getting worse.  You have very bad pain and also have any of these symptoms: ? A fever. ? Feeling sick to your stomach (nausea). ? Throwing up (vomiting). ? Being very sweaty.  You pass out (lose consciousness). Summary  Pelvic pain is pain in your lower belly (abdomen), below your belly button and between your hips.  There are many possible causes of pelvic pain.  Keep a journal of your pelvic pain. This information is not intended to replace advice given to you by your health care provider. Make sure you discuss any questions you have with your health care provider. Document  Released: 04/14/2008 Document Revised: 04/14/2018 Document Reviewed: 04/14/2018 Elsevier Patient Education  2020 Elsevier Inc.  

## 2019-07-14 LAB — CBC
Hematocrit: 39.3 % (ref 34.0–46.6)
Hemoglobin: 13.3 g/dL (ref 11.1–15.9)
MCH: 31.5 pg (ref 26.6–33.0)
MCHC: 33.8 g/dL (ref 31.5–35.7)
MCV: 93 fL (ref 79–97)
Platelets: 212 10*3/uL (ref 150–450)
RBC: 4.22 x10E6/uL (ref 3.77–5.28)
RDW: 12.7 % (ref 11.7–15.4)
WBC: 5.6 10*3/uL (ref 3.4–10.8)

## 2019-07-14 LAB — TSH: TSH: 0.924 u[IU]/mL (ref 0.450–4.500)

## 2019-07-27 ENCOUNTER — Other Ambulatory Visit: Payer: Self-pay

## 2019-07-27 ENCOUNTER — Ambulatory Visit (HOSPITAL_COMMUNITY)
Admission: RE | Admit: 2019-07-27 | Discharge: 2019-07-27 | Disposition: A | Discharge status: Home / Self Care | Payer: Medicaid Other | Source: Ambulatory Visit | Attending: Obstetrics and Gynecology | Admitting: Obstetrics and Gynecology

## 2019-07-27 DIAGNOSIS — D279 Benign neoplasm of unspecified ovary: Secondary | ICD-10-CM | POA: Diagnosis not present

## 2019-07-27 DIAGNOSIS — R102 Pelvic and perineal pain: Secondary | ICD-10-CM

## 2019-08-21 ENCOUNTER — Emergency Department (HOSPITAL_COMMUNITY): Payer: Medicaid Other

## 2019-08-21 ENCOUNTER — Encounter (HOSPITAL_COMMUNITY): Payer: Self-pay | Admitting: Emergency Medicine

## 2019-08-21 ENCOUNTER — Emergency Department (HOSPITAL_COMMUNITY)
Admission: EM | Admit: 2019-08-21 | Discharge: 2019-08-21 | Disposition: A | Discharge status: Home / Self Care | Payer: Medicaid Other | Attending: Emergency Medicine | Admitting: Emergency Medicine

## 2019-08-21 DIAGNOSIS — K0889 Other specified disorders of teeth and supporting structures: Secondary | ICD-10-CM | POA: Diagnosis not present

## 2019-08-21 DIAGNOSIS — J029 Acute pharyngitis, unspecified: Secondary | ICD-10-CM | POA: Insufficient documentation

## 2019-08-21 DIAGNOSIS — Z87891 Personal history of nicotine dependence: Secondary | ICD-10-CM | POA: Diagnosis not present

## 2019-08-21 LAB — I-STAT CHEM 8, ED
BUN: 6 mg/dL (ref 6–20)
Calcium, Ion: 1.14 mmol/L — ABNORMAL LOW (ref 1.15–1.40)
Chloride: 104 mmol/L (ref 98–111)
Creatinine, Ser: 0.8 mg/dL (ref 0.44–1.00)
Glucose, Bld: 82 mg/dL (ref 70–99)
HCT: 38 % (ref 36.0–46.0)
Hemoglobin: 12.9 g/dL (ref 12.0–15.0)
Potassium: 3.6 mmol/L (ref 3.5–5.1)
Sodium: 140 mmol/L (ref 135–145)
TCO2: 25 mmol/L (ref 22–32)

## 2019-08-21 LAB — I-STAT BETA HCG BLOOD, ED (MC, WL, AP ONLY): I-stat hCG, quantitative: <5 m[IU]/mL (ref <5)

## 2019-08-21 MED ORDER — IOHEXOL 300 MG/ML  SOLN
75.0000 mL | Freq: Once | INTRAMUSCULAR | Status: AC | PRN
  Administered 2019-08-21: 75 mL via INTRAVENOUS

## 2019-08-21 MED ORDER — CLINDAMYCIN HCL 300 MG PO CAPS: 300.0000 mg | ORAL_CAPSULE | Freq: Two times a day (BID) | ORAL | 0 refills | Status: AC

## 2019-08-21 NOTE — ED Triage Notes (Signed)
Pt here with c/o dental pain to the left lower jaw , some slight swelling to the outside of jaw

## 2019-08-21 NOTE — Discharge Instructions (Signed)
You may take Tylenol and ibuprofen as needed for pain.  Do not take more than 4000 mg of Tylenol or more than 2400 mg ibuprofen for pain.  I have prescribed antibiotics.  Please take as prescribed.

## 2019-08-21 NOTE — ED Provider Notes (Signed)
[facility] EMERGENCY DEPARTMENT Provider Note   CSN: [id number] Arrival date & time: 08/21/19  1016     History   Chief Complaint Sore throat   HPI Deanna Valencia is a 23 y.o. female with past medical history significant for TB, obesity, hypertension, borderline personality disorder who presents for evaluation of sore throat and dental pain x 10 days.  Patient states she originally had dental pain located to her left posterior molar.  Patient states she has been taking Tylenol with mild relief of her symptoms.  Patient states she woke up this morning and she feels like she has pain which goes down the lateral and anterior portion of her left side of her neck.  Patient states she feels a "lump" when she swallows.  She has been able to tolerate p.o. intake without difficulty.  She denies fever, chills, nausea, vomiting, neck stiffness, drooling, dysphasia, trismus, swelling to her face.  Denies additional aggravating or alleviating factors.  She rates her current pain a 6/10.  History obtained from patient and past medical records.  No interpreter was used.     HPI  Past Medical History:  Diagnosis Date  . Allergy   . Anxiety   . Depression   . Eating disorder   . Foster care (status) 10/18/2013  . Morbid obesity ([place]) 06/15/2017  . UTI (urinary tract infection) 10/03/2013    Patient Active Problem List   Diagnosis Date Noted  . Pelvic pain 07/13/2019  . DUB (dysfunctional uterine bleeding) 07/13/2019  . Hypertension 07/13/2019  . Morbid obesity ([place]) 06/15/2017  . Maternal varicella, non-immune 05/19/2017  . Rubella non-immune status, antepartum 05/19/2017  . Uterine fibroid 05/18/2017  . Dermoid cyst of ovary 05/18/2017  . Deliberate self-cutting 05/18/2017  . Hidradenitis axillaris 11/11/2016  . Borderline personality disorder ([place]) 03/01/2015  . Generalized anxiety disorder 03/01/2015  . 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 PTSD (post-traumatic stress disorder) 03/01/2015    History reviewed. No pertinent surgical history.   OB History    Gravida  1   Para      Term      Preterm      AB      Living  1     SAB      TAB      Ectopic      Multiple      Live Births  1            Home Medications    Prior to Admission medications   Medication Sig Start Date End Date Taking? Authorizing Provider  clindamycin (CLEOCIN) 300 MG capsule Take 1 capsule (300 mg total) by mouth 2 (two) times daily for 5 days. 08/21/19 08/26/19  ,  A, PA-C  traMADol (ULTRAM) 50 MG tablet Take 1 tablet (50 mg total) by mouth every 6 (six) hours as needed for moderate pain. 07/13/19   Chancy Milroy, MD    Family History Family History  Problem Relation Age of Onset  . Hypertension Father   . Diabetes Paternal Grandfather   . Hypertension Paternal Grandfather   . Hearing loss Paternal Grandfather     Social History Social History   Tobacco Use  . Smoking status: Former Smoker    Packs/day: 0.25    Years: 1.00    Pack years: 0.25    Types: Cigarettes  . Smokeless tobacco: Never Used  . Tobacco comment: states smokes occasionally not daily.. black  and milds  Substance Use Topics  . Alcohol use: No    Alcohol/week: 0.0 standard drinks    Comment: occasionally..07/22/16 quit a year ago... 12-16-16  wine   . Drug use: Yes    Types: Marijuana    Comment: last smoked right before preg confirmed.     Allergies   Amoxicillin and Tomato   Review of Systems Review of Systems  Constitutional: Negative.   HENT: Positive for dental problem and sore throat. Negative for congestion, drooling, ear discharge, ear pain, facial swelling, hearing loss, mouth sores, postnasal drip, rhinorrhea, sinus pressure, sinus pain and voice change.   Respiratory: Negative.   Cardiovascular: Negative.   Gastrointestinal: Negative.   Genitourinary: Negative.   Musculoskeletal:  Positive for neck pain. Negative for neck stiffness.  Skin: Negative.   Neurological: Negative.   All other systems reviewed and are negative.    Physical Exam Updated Vital Signs BP (!) 144/105 (BP Location: Right Arm)   Pulse 67   Temp 98.3 F (36.8 C) (Oral)   Resp 16   Ht 5\' 1"  (1.549 m)   Wt 77.6 kg   SpO2 100%   BMI 32.31 kg/m   Physical Exam Vitals signs and nursing note reviewed.  Constitutional:      General: She is not in acute distress.    Appearance: She is well-developed. She is not ill-appearing, toxic-appearing or diaphoretic.  HENT:     Head: Normocephalic and atraumatic.     Jaw: There is normal jaw occlusion.     Comments: No facial swelling.  Submandibular area soft.  No evidence of jaw occlusion.    Mouth/Throat:     Lips: Pink.     Mouth: Mucous membranes are moist. No injury, lacerations, oral lesions or angioedema.     Dentition: Abnormal dentition. Dental tenderness and dental caries present. No gingival swelling, dental abscesses or gum lesions.     Tonsils: No tonsillar exudate or tonsillar abscesses.      Comments: Dentition with multiple cavities.  Tenderness to left posterior molar obvious cavity.  No gingival erythema or swelling.  No evidence of drainable periapical abscess.  Uvula midline without deviation.  No drooling, dysphasia or trismus.  Posterior oropharynx without erythema.  No evidence of PTA or RPA.  Sublingual area soft. Eyes:     Pupils: Pupils are equal, round, and reactive to light.  Neck:     Musculoskeletal: Full passive range of motion without pain and normal range of motion.     Trachea: Trachea and phonation normal.      Comments: Phonation normal.  Full range of motion to neck without difficulty.  Neck supple crepitus, edema. Cardiovascular:     Rate and Rhythm: Normal rate.     Pulses: Normal pulses.     Heart sounds: Normal heart sounds.  Pulmonary:     Effort: Pulmonary effort is normal. No respiratory distress.      Breath sounds: Normal breath sounds and air entry.  Abdominal:     General: There is no distension.     Palpations: Abdomen is soft.     Tenderness: There is no abdominal tenderness. There is no right CVA tenderness or left CVA tenderness.     Hernia: No hernia is present.  Musculoskeletal: Normal range of motion.     Comments: Is all 4 extremities without difficulty.  Skin:    General: Skin is warm and dry.  Neurological:     Mental Status: She is alert.  ED Treatments / Results  Labs (all labs ordered are listed, but only abnormal results are displayed) Labs Reviewed  I-STAT CHEM 8, ED - Abnormal; Notable for the following components:      Result Value   Calcium, Ion 1.14 (*)    All other components within normal limits  I-STAT BETA HCG BLOOD, ED ([facility], [facility], [facility] ONLY)    EKG None  Radiology Ct Soft Tissue Neck W Contrast  Result Date: 08/21/2019 CLINICAL DATA:  Left dental pain and jaw swelling since yesterday. Concern for dental abscess. Sore throat/stridor. EXAM: CT NECK WITH CONTRAST TECHNIQUE: Multidetector CT imaging of the neck was performed using the standard protocol following the bolus administration of intravenous contrast. CONTRAST:  61mL OMNIPAQUE IOHEXOL 300 MG/ML  SOLN COMPARISON:  11/03/2017 FINDINGS: Pharynx and larynx: Mild-to-moderate fullness of the posterior nasopharyngeal and palatine tonsillar soft tissues bilaterally without evidence of asymmetric swelling or mass. No peritonsillar or retropharyngeal fluid collection. Patent airway. Salivary glands: No inflammation, mass, or stone. Thyroid: Unremarkable. Lymph nodes: Borderline enlarged level II lymph nodes measuring up to 1 cm in short axis, likely benign/reactive. Vascular: Major vascular structures of the neck are patent. Limited intracranial: Unremarkable. Visualized orbits: Unremarkable. Mastoids and visualized paranasal sinuses: Minimal mucosal thickening and small mucous retention cysts in the  maxillary sinuses. Visualized mastoid air cells are clear. Skeleton: Dental caries involving bilateral maxillary and left-sided mandibular molar teeth without evidence of periapical or subperiosteal abscess. Upper chest: Clear lung apices. Other: None. IMPRESSION: Dental caries without evidence of abscess. Electronically Signed   By: Logan Bores M.D.   On: 08/21/2019 14:03    Procedures Procedures (including critical care time)  Medications Ordered in ED Medications  iohexol (OMNIPAQUE) 300 MG/ML solution 75 mL (75 mLs Intravenous Contrast Given 08/21/19 1307)     Initial Impression / Assessment and Plan / ED Course  I have reviewed the triage vital signs and the nursing notes.  Pertinent labs & imaging results that were available during my care of the patient were reviewed by me and considered in my medical decision making (see chart for details).  23th female peers otherwise well initially presented for dental pain however on my exam complains more of left anterior neck pain which she states extends from her left dentition. she has been able to tolerate p.o. intake however feels like she has a "ball" when she swallows.  She is afebrile, nonseptic, non-ill-appearing.  No mass visualized.  No evidence or suspicion for foreign body ingestion.  Uvula midline without deviation.  No posterior oropharynx erythema.  No drooling, dysphasia or trismus.  Given location of patient's pain will obtain CT scan neck to assess for deep space infection.  Her sublingual area soft.  No evidence of Ludwig's angina.  No evidence of strep pharyngitis on exam.   She is without any evidence of infectious process or mass.  Likely referred pain from dental caries.  Discussed Tylenol/ ibuprofen needed for pain.  Also given antibiotics.  She is allergic to penicillins.  Given clindamycin.  Referrals for dentistry provided.  If patient continues to have mass-like feeling when she swallows patient referred to GI.  She does  not have any evidence of emergent endoscopic needs in the emergency department today.  The patient has been appropriately medically screened and/or stabilized in the ED. I have low suspicion for any other emergent medical condition which would require further screening, evaluation or treatment in the ED or require inpatient management.  Final Clinical Impressions(s) / ED Diagnoses   Final diagnoses:  Pain, dental  Sore throat    ED Discharge Orders         Ordered    clindamycin (CLEOCIN) 300 MG capsule  2 times daily     08/21/19 1413           ,  A, PA-C 08/21/19 [place], [place], DO 08/21/19 1516

## 2019-08-31 ENCOUNTER — Encounter (HOSPITAL_BASED_OUTPATIENT_CLINIC_OR_DEPARTMENT_OTHER): Payer: Self-pay | Admitting: *Deleted

## 2019-09-03 ENCOUNTER — Other Ambulatory Visit (HOSPITAL_COMMUNITY): Payer: Medicaid Other | Attending: Obstetrics and Gynecology

## 2019-09-07 ENCOUNTER — Encounter (HOSPITAL_BASED_OUTPATIENT_CLINIC_OR_DEPARTMENT_OTHER): Admission: RE | Payer: Self-pay | Source: Home / Self Care

## 2019-09-07 ENCOUNTER — Ambulatory Visit (HOSPITAL_BASED_OUTPATIENT_CLINIC_OR_DEPARTMENT_OTHER)
Admission: RE | Admit: 2019-09-07 | Payer: Medicaid Other | Source: Home / Self Care | Admitting: Obstetrics and Gynecology

## 2019-09-07 SURGERY — LAPAROSCOPY, DIAGNOSTIC
Anesthesia: Choice

## 2020-06-05 ENCOUNTER — Other Ambulatory Visit: Payer: Self-pay

## 2020-06-05 ENCOUNTER — Emergency Department (HOSPITAL_COMMUNITY): Payer: Medicaid Other

## 2020-06-05 ENCOUNTER — Emergency Department (HOSPITAL_COMMUNITY)
Admission: EM | Admit: 2020-06-05 | Discharge: 2020-06-05 | Disposition: A | Discharge status: Home / Self Care | Payer: Medicaid Other | Attending: Emergency Medicine | Admitting: Emergency Medicine

## 2020-06-05 ENCOUNTER — Encounter (HOSPITAL_COMMUNITY): Payer: Self-pay | Admitting: Obstetrics and Gynecology

## 2020-06-05 DIAGNOSIS — I10 Essential (primary) hypertension: Secondary | ICD-10-CM | POA: Insufficient documentation

## 2020-06-05 DIAGNOSIS — M25461 Effusion, right knee: Secondary | ICD-10-CM | POA: Insufficient documentation

## 2020-06-05 DIAGNOSIS — F17210 Nicotine dependence, cigarettes, uncomplicated: Secondary | ICD-10-CM | POA: Diagnosis not present

## 2020-06-05 DIAGNOSIS — M25561 Pain in right knee: Secondary | ICD-10-CM | POA: Diagnosis present

## 2020-06-05 MED ORDER — TETANUS-DIPHTH-ACELL PERTUSSIS 5-2.5-18.5 LF-MCG/0.5 IM SUSP: 0.5000 mL | Freq: Once | INTRAMUSCULAR | Status: DC

## 2020-06-05 NOTE — Discharge Instructions (Addendum)
Please read and follow all provided instructions.  You have been seen today for right knee pain.  This is most likely an knee sprain.  I want you to follow-up with Dr. Lorin Mercy, orthopedics.  Please call their office in the morning.  And I said you do not want to take any medications, however if you are going to please take Tylenol as prescribed on the bottle.  Continue to use the brace and the crutches.  Follow directions below.  Tests performed today include: An x-ray of the affected area - does NOT show any broken bones or dislocations.  Vital signs. See below for your results today.   Home care instructions: -- *PRICE in the first 24-48 hours after injury: Protect (with brace, splint, sling), if given by your provider Rest Ice- Do not apply ice pack directly to your skin, place towel or similar between your skin and ice/ice pack. Apply ice for 20 min, then remove for 40 min while awake Compression- Wear brace, elastic bandage, splint as directed by your provider Elevate affected extremity above the level of your heart when not walking around for the first 24-48 hours   Use Ibuprofen (Motrin/Advil) 600mg  every 6 hours as needed for pain (do not exceed max dose in 24 hours, 2400mg )  Follow-up instructions: Please follow-up with your primary care provider or the provided orthopedic physician (bone specialist) if you continue to have significant pain in 1 week. In this case you may have a more severe injury that requires further care.   Return instructions:  Please return if your toes or feet are numb or tingling, appear gray or blue, or you have severe pain (also elevate the leg and loosen splint or wrap if you were given one) Please return to the Emergency Department if you experience worsening symptoms.  Please return if you have any other emergent concerns. Additional Information:  Your vital signs today were: BP (!) 174/116 (BP Location: Left Arm)   Pulse 71   Temp 98.6 F (37 C)  (Oral)   Resp 20   LMP 06/05/2020   SpO2 98%  If your blood pressure (BP) was elevated above 135/85 this visit, please have this repeated by your doctor within one month. --------------- Your blood pressure was elevated in the emergency department today, I do want you to follow-up with primary care about this.
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
## 2020-06-05 NOTE — ED Notes (Signed)
Patient refused vitals.

## 2020-06-05 NOTE — Progress Notes (Signed)
Orthopedic Tech Progress Note Patient Details:  Deanna Valencia Sep 28, 1996 [id number]  Ortho Devices Ortho Device/Splint Location: applied knee sleeve toRLE and crutches Ortho Device/Splint Interventions: Ordered, Application, Adjustment   Post Interventions Patient Tolerated: Well Instructions Provided: Care of device   Braulio Bosch 06/05/2020, 8:29 PM

## 2020-06-05 NOTE — ED Notes (Signed)
Called ortho to bedside  

## 2020-06-05 NOTE — ED Triage Notes (Signed)
Patient reports to the ER for knee pain on the right side. Patient reports she fell

## 2021-01-03 ENCOUNTER — Ambulatory Visit (INDEPENDENT_AMBULATORY_CARE_PROVIDER_SITE_OTHER): Payer: Medicaid Other | Admitting: Obstetrics

## 2021-01-03 ENCOUNTER — Other Ambulatory Visit (HOSPITAL_COMMUNITY)
Admission: RE | Admit: 2021-01-03 | Discharge: 2021-01-03 | Disposition: A | Discharge status: Home / Self Care | Payer: Medicaid Other | Source: Ambulatory Visit | Attending: Obstetrics | Admitting: Obstetrics

## 2021-01-03 ENCOUNTER — Other Ambulatory Visit: Payer: Self-pay

## 2021-01-03 ENCOUNTER — Encounter: Payer: Self-pay | Admitting: Obstetrics

## 2021-01-03 VITALS — BP 162/105 | HR 72 | Ht 60.0 in | Wt 197.0 lb

## 2021-01-03 DIAGNOSIS — Z01419 Encounter for gynecological examination (general) (routine) without abnormal findings: Secondary | ICD-10-CM | POA: Insufficient documentation

## 2021-01-03 DIAGNOSIS — Z113 Encounter for screening for infections with a predominantly sexual mode of transmission: Secondary | ICD-10-CM | POA: Diagnosis not present

## 2021-01-03 DIAGNOSIS — D270 Benign neoplasm of right ovary: Secondary | ICD-10-CM | POA: Diagnosis not present

## 2021-01-03 DIAGNOSIS — N898 Other specified noninflammatory disorders of vagina: Secondary | ICD-10-CM

## 2021-01-03 DIAGNOSIS — F17200 Nicotine dependence, unspecified, uncomplicated: Secondary | ICD-10-CM

## 2021-01-03 DIAGNOSIS — I10 Essential (primary) hypertension: Secondary | ICD-10-CM

## 2021-01-03 DIAGNOSIS — E669 Obesity, unspecified: Secondary | ICD-10-CM

## 2021-01-03 MED ORDER — AMLODIPINE BESYLATE 5 MG PO TABS: 5.0000 mg | ORAL_TABLET | Freq: Every day | ORAL | 11 refills | Status: DC

## 2021-01-03 MED ORDER — TRIAMTERENE-HCTZ 37.5-25 MG PO CAPS: 1.0000 | ORAL_CAPSULE | Freq: Every day | ORAL | 11 refills | Status: DC

## 2021-01-03 NOTE — Progress Notes (Signed)
GYN presents for AEX/PAP/STD screening. C/o pain 10/10 x 8+ months.  She wants to know if her Cyst has grown larger.  PHQ-9=10  BP is elevated today. Needs PCP referral to manage and treat BP. C/o headaches 6/10, blurry vision and pain in ankles.

## 2021-01-03 NOTE — Progress Notes (Addendum)
Subjective:        Deanna Valencia is a 25 y.o. female here for a routine exam.  Current complaints: Occasional pelvic pain.  Has a history of a Dermoid Cyst of Right Ovary that was diagnosed in 2020 by ultrasound, but she did not follow up.  Personal health questionnaire:  Is patient Deanna Valencia, have a family history of breast and/or ovarian cancer: no Is there a family history of uterine cancer diagnosed at age < 43, gastrointestinal cancer, urinary tract cancer, family member who is a Field seismologist syndrome-associated carrier: no Is the patient overweight and hypertensive, family history of diabetes, personal history of gestational diabetes, preeclampsia or PCOS: yes Is patient over 8, have PCOS,  family history of premature CHD under age 73, diabetes, smoke, have hypertension or peripheral artery disease:  no At any time, has a partner hit, kicked or otherwise hurt or frightened you?: no Over the past 2 weeks, have you felt down, depressed or hopeless?: no Over the past 2 weeks, have you felt little interest or pleasure in doing things?:no   Gynecologic History Patient's last menstrual period was 12/14/2019 (exact date). Contraception: none Last Pap: 05-18-2017. Results were: normal Last mammogram: n/a. Results were: n/a  Obstetric History OB History  Gravida Para Term Preterm AB Living  1         1  SAB IAB Ectopic Multiple Live Births          1    # Outcome Date GA Lbr Len/2nd Weight Sex Delivery Anes PTL Lv  1 Gravida             Past Medical History:  Diagnosis Date  . Anxiety   . Depression   . Eating disorder     History reviewed. No pertinent surgical history.   Current Outpatient Medications:  .  amLODipine (NORVASC) 5 MG tablet, Take 1 tablet (5 mg total) by mouth daily., Disp: 30 tablet, Rfl: 11 .  triamterene-hydrochlorothiazide (DYAZIDE) 37.5-25 MG capsule, Take 1 each (1 capsule total) by mouth daily., Disp: 30 capsule, Rfl: 11 .  traMADol (ULTRAM) 50  MG tablet, Take 1 tablet (50 mg total) by mouth every 6 (six) hours as needed for moderate pain. (Patient not taking: Reported on 01/03/2021), Disp: 60 tablet, Rfl: 0 Allergies  Allergen Reactions  . Amoxicillin Hives    Has patient had a PCN reaction causing immediate rash, facial/tongue/throat swelling, SOB or lightheadedness with hypotension: no Has patient had a PCN reaction causing severe rash involving mucus membranes or skin necrosis: unknown Has patient had a PCN reaction that required hospitalization: no Has patient had a PCN reaction occurring within the last 10 years: no If all of the above answers are "NO", then may proceed with Cephalosporin use.   . Tomato Itching and Rash    Social History   Tobacco Use  . Smoking status: Current Every Day Smoker    Packs/day: 0.25    Years: 1.00    Pack years: 0.25    Types: Cigarettes  . Smokeless tobacco: Never Used  Substance Use Topics  . Alcohol use: Yes    Alcohol/week: 0.0 standard drinks    Comment: occasionally..07/22/16 quit a year ago... 12-16-16  wine     Family History  Problem Relation Age of Onset  . Hypertension Father   . Diabetes Paternal Grandfather   . Hypertension Paternal Grandfather   . Hearing loss Paternal Grandfather       Review of Systems  Constitutional: negative  for fatigue and weight loss Respiratory: negative for cough and wheezing Cardiovascular: negative for chest pain, fatigue and palpitations Gastrointestinal: negative for abdominal pain and change in bowel habits Musculoskeletal:negative for myalgias Neurological: negative for gait problems and tremors Behavioral/Psych: negative for abusive relationship, depression Endocrine: negative for temperature intolerance    Genitourinary:negative for abnormal menstrual periods, genital lesions, hot flashes, sexual problems.  Positive for vaginal discharge and occasional pelvic pain Integument/breast: negative for breast lump, breast tenderness,  nipple discharge and skin lesion(s)    Objective:       BP (!) 162/105 (BP Location: Left Arm, Cuff Size: Large)   Pulse 72   Ht 5' (1.524 m)   Wt 197 lb (89.4 kg)   LMP 12/14/2019 (Exact Date)   BMI 38.47 kg/m  General:   alert and no distress  Skin:   no rash or abnormalities  Lungs:   clear to auscultation bilaterally  Heart:   regular rate and rhythm, S1, S2 normal, no murmur, click, rub or gallop  Breasts:   normal without suspicious masses, skin or nipple changes or axillary nodes  Abdomen:  normal findings: no organomegaly, soft, non-tender and no hernia  Pelvis:  External genitalia: normal general appearance Urinary system: urethral meatus normal and bladder without fullness, nontender Vaginal: normal without tenderness, induration or masses Cervix: normal appearance Adnexa: normal bimanual exam Uterus: anteverted and non-tender, normal size   Lab Review Urine pregnancy test Labs reviewed yes Radiologic studies reviewed yes  50% of 20 min visit spent on counseling and coordination of care.   Assessment:     1. Encounter for gynecological examination with Papanicolaou smear of cervix Rx: - Cytology - PAP( [place])  2. Vaginal discharge Rx: - Cervicovaginal ancillary only( [place])  3. Screening for STD (sexually transmitted disease) Rx: - RPR+HBsAg+HCVAb+...  4. Dermoid cyst of ovary, right Rx: - US PELVIC COMPLETE WITH TRANSVAGINAL; Future  5. HTN (hypertension), benign Rx: - Ambulatory referral to Internal Medicine - amLODipine (NORVASC) 5 MG tablet; Take 1 tablet (5 mg total) by mouth daily.  Dispense: 30 tablet; Refill: 11 - triamterene-hydrochlorothiazide (DYAZIDE) 37.5-25 MG capsule; Take 1 each (1 capsule total) by mouth daily.  Dispense: 30 capsule; Refill: 11  6. Obesity (BMI 35.0-39.9 without comorbidity) - program of caloric reduction, exercise and behavioral modification recommended  7. Tobacco dependence - cessation with the aid  of medication and behavioral modification recommended    Plan:    Education reviewed: calcium supplements, depression evaluation, low fat, low cholesterol diet, safe sex/STD prevention, self breast exams, smoking cessation and weight bearing exercise. Contraception: none. Follow up in: 3 weeks.   Meds ordered this encounter  Medications  . amLODipine (NORVASC) 5 MG tablet    Sig: Take 1 tablet (5 mg total) by mouth daily.    Dispense:  30 tablet    Refill:  11  . triamterene-hydrochlorothiazide (DYAZIDE) 37.5-25 MG capsule    Sig: Take 1 each (1 capsule total) by mouth daily.    Dispense:  30 capsule    Refill:  11   Orders Placed This Encounter  Procedures  . US PELVIC COMPLETE WITH TRANSVAGINAL    Standing Status:   Future    Standing Expiration Date:   01/03/2022    Order Specific Question:   Reason for Exam (SYMPTOM  OR DIAGNOSIS REQUIRED)    Answer:   Dermoid Cyst of Right Ovary, Follow Up from 2020 Ultrasound    Order Specific Question:   Preferred imaging  location?    Answer:   [facility]  . RPR+HBsAg+HCVAb+...  . Ambulatory referral to Internal Medicine    Referral Priority:   Routine    Referral Type:   Consultation    Referral Reason:   Specialty Services Required    Requested Specialty:   Internal Medicine    Number of Visits Requested:   1     Shelly Bombard, MD 01/03/2021 12:19 PM

## 2021-01-03 NOTE — Patient Instructions (Signed)
[place] [phone]) . [place] o [address] [place], [place] [address] o [phone] o Mon-Fri 8:30-12:30, 1:30-5:00 o Accepting Medicaid . [place] at [facility] [place], [place], [place] [address] o [phone] o Mon-Fri 8:00-5:30 . [place] [place], [place] [address] o [phone], Tue, Thur, Fri 8:30-5:00, Wed 10:00-7:00 (closed 1-2pm) o Accepting Medicaid . [facility] o [address], [place], [place]  [address] o Phone - [phone]   Fax - [phone]  [place] [phone]) . [place] [place], [place] [address] o [phone] o Mon-Fri 8:00-5:00 . [facility] at [facility])  o [address] [place], [place] [address] o [phone] o Mon-Fri 8:30-5:30, Sat (Oct.-Mar.) 9:00-1:00 o Accepting [facility] [phone]) . [place] at [place], [place], [place] [address] o [phone] o Mon-Fri 8:00-5:00  [place] [phone]) . [place] at [place], [place], [place] [address] o [phone] o Mon-Fri 8:00-5:00 . Therapist, music at [place], [place], [place] [address] o [phone] o Mon-Fri 8:00-5:00 . Therapist, music at [place] [place], [place] [address] o [phone] o Mon-Fri 8:00-5:00 . [place] [place] [address] o [phone] o Mon-Fri 7:30-5:30  [place] ([place]) . [place] [place], [place] [address] o [phone] o Mon-Thur 8:00-6:00 o Accepting Medicaid . [employer] [place], [place] [address] o [phone] o Mon-Thur 7:30-7:30, Fri 7:30-4:30 o Accepting Medicaid . [place] at [place] [address], [place], [place]  [address] o [phone]   Fax - [place] [phone] & [phone]) . Therapist, music at [place] [place], [place] [address] o [phone] o Mon-Fri 7:00-5:00 . [place], [place], [place] [address] o [phone] o Mon-Fri 8:00-5:00 o Accepting Medicaid . [place], [place], [place] [address] o [phone] o Mon-Fri 8:00-5:00 o Accepting Medicaid  [facility] [place] [phone]) . [facility] o [address] [place], [place] [address] o [phone] o Mon-Fri 8:00-5:00 . [place] ([place] at [employer]) o [address] [place], [place], [place] [address] o [phone] o Mon-Fri 8:00-5:00 o Accepting Medicaid . [place] ([facility] [employer]) o [address] [place], [place], [place] [address] o [phone] o Mon-Fri 8:00-5:30, Sat&Sun by appointment (phones open at 8:30) o Accepting [facility] [phone] & [phone]) . [facility] o [address] [place], [place] [address] o [phone] o Mon-Thur 8:00-7:00, Fri 8:00-5:00, Sat 8:00-12:00, Sun 9:00-12:00 o Accepting Medicaid . [facility] at [facility] [address], [place], [place] [address] o [phone] o Mon-Thur 8:00-5:00 o Accepting Medicaid . [facility] at [place]., [place],  [address] o [phone] o Mon-Fri 8:00-5:30, Sat (Oct.-Mar.) 9:00-1:00 o Accepting [employer]  [phone]) .  [place] o [address] [place], [place], [place] [address] o [phone] o Mon-Fri 8:00-5:00 o Accepting Medicaid   [facility] [phone]) . [place] at [place], [place], [place] [address] o [phone] o Mon-Fri 8:00-5:00 . Therapist, music at [facility] o [address], [place], [place] [address] o [phone] o Mon-Fri 8:00-5:00 . [place] [address], [place], [place] [address] o [phone] o Mon-Fri 8:00-5:00 o After hours clinic [facility] [place], [place] [address]) [phone] Mon-Fri 5:00-8:00, Sat 12:00-6:00, Sun 10:00-4:00 o Accepting Medicaid . [place] at [facility] o [address], [place], [place]  [address] o [phone]   Fax - [phone]  [place] [phone]) . Therapist, music at [place] o [address], [place], [place] [address] o [phone] o Mon-Fri 8:00-5:00 . [facility] at [place]) o [place] [address], [place], [place] [address] o [phone] o Mon-Thur 8:00-7:00, Fri 8:00-5:00, Sat 8:00-12:00

## 2021-01-04 LAB — CERVICOVAGINAL ANCILLARY ONLY
Bacterial Vaginitis (gardnerella): POSITIVE — AB
Candida Glabrata: NEGATIVE
Candida Vaginitis: NEGATIVE
Chlamydia: NEGATIVE
Comment: NEGATIVE
Comment: NEGATIVE
Comment: NEGATIVE
Comment: NEGATIVE
Comment: NEGATIVE
Comment: NORMAL
Neisseria Gonorrhea: NEGATIVE
Trichomonas: NEGATIVE

## 2021-01-04 LAB — RPR+HBSAG+HCVAB+...
HIV Screen 4th Generation wRfx: NONREACTIVE
Hep C Virus Ab: <0.1 s/co ratio (ref 0.0–0.9)
Hepatitis B Surface Ag: NEGATIVE
RPR Ser Ql: NONREACTIVE

## 2021-01-07 ENCOUNTER — Other Ambulatory Visit: Payer: Self-pay | Admitting: Obstetrics

## 2021-01-07 DIAGNOSIS — B9689 Other specified bacterial agents as the cause of diseases classified elsewhere: Secondary | ICD-10-CM

## 2021-01-07 MED ORDER — METRONIDAZOLE 500 MG PO TABS: 500.0000 mg | ORAL_TABLET | Freq: Two times a day (BID) | ORAL | 2 refills | Status: DC

## 2021-01-08 LAB — CYTOLOGY - PAP
Diagnosis: NEGATIVE
Diagnosis: REACTIVE

## 2021-01-11 ENCOUNTER — Other Ambulatory Visit: Payer: Self-pay

## 2021-01-11 ENCOUNTER — Ambulatory Visit
Admission: RE | Admit: 2021-01-11 | Discharge: 2021-01-11 | Disposition: A | Discharge status: Home / Self Care | Payer: Medicaid Other | Source: Ambulatory Visit | Attending: Obstetrics | Admitting: Obstetrics

## 2021-01-11 DIAGNOSIS — D270 Benign neoplasm of right ovary: Secondary | ICD-10-CM | POA: Insufficient documentation

## 2021-02-13 ENCOUNTER — Encounter: Payer: Self-pay | Admitting: Obstetrics and Gynecology

## 2021-02-13 ENCOUNTER — Other Ambulatory Visit: Payer: Self-pay

## 2021-02-13 ENCOUNTER — Ambulatory Visit (INDEPENDENT_AMBULATORY_CARE_PROVIDER_SITE_OTHER): Payer: Medicaid Other | Admitting: Obstetrics and Gynecology

## 2021-02-13 VITALS — BP 161/105 | HR 77 | Wt 193.0 lb

## 2021-02-13 DIAGNOSIS — D279 Benign neoplasm of unspecified ovary: Secondary | ICD-10-CM | POA: Diagnosis not present

## 2021-02-13 DIAGNOSIS — I10 Essential (primary) hypertension: Secondary | ICD-10-CM | POA: Diagnosis not present

## 2021-02-13 NOTE — Progress Notes (Signed)
Deanna Valencia presents in referral from Dr. Johnney Ou for eval of bilateral dermoid cysts. This is a chronic problem. Pt was to have removed in the past but cancelled her surgery. Latest U/S revealed bilateral dermoid cysts largest @ 8-9 cm.  She also has chronic hypertension. She is not taking her BP medication. FH of HTN and CVA  PE AF BP 161/103  Lungs clear Heart RRR Abd soft + BS  A/P Bilateral ovarian dermoid cysts        Hypertension  Importance of BP control reviewed with pt. Encouraged pt to take her BP medication. Will refer to FM for further eval and management  Dermoid cyts reviewed with pt. Surgerical management recommended to pt. Due to size and bilateral, Exp lap with ovarian cystectomy recommended. Information provided to pt. R/B/Post op care reviewed. Will schedule. F/U with post op appt.

## 2021-02-13 NOTE — Patient Instructions (Signed)
Diagnostic Laparoscopy Diagnostic laparoscopy is a procedure to diagnose problems in the abdomen. It might be done for a variety of reasons, such as to look for scar tissue, a reason for abdominal pain, an abdominal mass or tumor, or fluid in the abdomen (ascites). This procedure may also be done to remove a tissue sample from the liver to look at under a microscope (biopsy). During the procedure, a thin, flexible tube that has a light and a camera on the end (laparoscope) is inserted through a small incision in the abdomen. The image from the camera is shown on a monitor to help the surgeon see inside the body. Tell a health care provider about:  Any allergies you have.  All medicines you are taking, including vitamins, herbs, eye drops, creams, and over-the-counter medicines.  Any problems you or family members have had with anesthetic medicines.  Any blood disorders you have.  Any surgeries you have had.  Any medical conditions you have.  Whether you are pregnant or may be pregnant. What are the risks? Generally, this is a safe procedure. However, problems may occur, including:  Infection.  Bleeding.  Allergic reactions to medicines or dyes.  Damage to abdominal structures or organs, such as the intestines, liver, stomach, or spleen. What happens before the procedure? Staying hydrated Follow instructions from your health care provider about hydration, which may include:  Up to 2 hours before the procedure - you may continue to drink clear liquids, such as water, clear fruit juice, black coffee, and plain tea.   Eating and drinking restrictions Follow instructions from your health care provider about eating and drinking, which may include:  8 hours before the procedure - stop eating heavy meals or foods, such as meat, fried foods, or fatty foods.  6 hours before the procedure - stop eating light meals or foods, such as toast or cereal.  6 hours before the procedure - stop  drinking milk or drinks that contain milk.  2 hours before the procedure - stop drinking clear liquids. Medicines Ask your health care provider about:  Changing or stopping your regular medicines. This is especially important if you are taking diabetes medicines or blood thinners.  Taking medicines such as aspirin and ibuprofen. These medicines can thin your blood. Do not take these medicines unless your health care provider tells you to take them.  Taking over-the-counter medicines, vitamins, herbs, and supplements. General instructions  Ask your health care provider: ? How your surgery site will be marked. ? What steps will be taken to help prevent infection. These steps may include:  Removing hair at the surgery site.  Washing skin with a germ-killing soap.  Taking antibiotic medicine.  Plan to have a responsible adult take you home from the hospital or clinic.  Plan to have a responsible adult care for you for the time you are told after you leave the hospital or clinic. This is important. What happens during the procedure?  An IV will be inserted into one of your veins.  You will be given one or more of the following: ? A medicine to help you relax (sedative). ? A medicine to numb the area (local anesthetic). ? A medicine to make you fall asleep (general anesthetic).  A breathing tube will be placed down your throat to help you breathe during the procedure.  Your abdomen will be filled with an air-like gas so that your abdomen expands. This will give the surgeon more room to operate and will make  your organs easier to see.  Many small incisions will be made in your abdomen.  A laparoscope and other surgical instruments will be inserted into your abdomen through these incisions.  A biopsy may be done. This will depend on the reason why you are having this procedure.  The laparoscope and other instruments will be removed from your abdomen.  The air-like gas will be  released from your abdomen.  Your incisions will be closed with stitches (sutures), skin glue, or surgical tapes and covered with a bandage (dressing).  Your breathing tube will be removed. The procedure may vary among health care providers and hospitals.   What happens after the procedure?  Your blood pressure, heart rate, breathing rate, and blood oxygen level will be monitored until you leave the hospital or clinic.  If you were given a sedative during the procedure, it can affect you for several hours. Do not drive or operate machinery until your health care provider says that it is safe.  It is up to you to get the results of your procedure. Ask your health care provider, or the department that is doing the procedure, when your results will be ready. Summary  Diagnostic laparoscopy is a procedure to diagnose problems in the abdomen using a thin, flexible tube that has a light and a camera on the end (laparoscope).  Follow instructions from your health care provider about how to prepare for the procedure.  Plan to have a responsible adult care for you for the time you are told after you leave the hospital or clinic. This is important. This information is not intended to replace advice given to you by your health care provider. Make sure you discuss any questions you have with your health care provider. Document Revised: 06/22/2020 Document Reviewed: 06/22/2020 Elsevier Patient Education  2021 [place]. Ovarian Cystectomy, Care After This sheet gives you information about how to care for yourself after your procedure. Your health care provider may also give you more specific instructions. If you have problems or questions, contact your health care provider. What can I expect after the procedure? After the procedure, it is common to have:  Pain in the abdomen, especially at the incision areas. You will be given pain medicine to control the pain.  Tiredness. This is a normal part  of the recovery process. Your energy level will return to normal over the next several weeks. Follow these instructions at home: Medicines  Take over-the-counter and prescription medicines only as told by your health care provider.  If you were prescribed an antibiotic medicine, use it as told by your health care provider. Do not stop using the antibiotic even if you start to feel better.  Do not take aspirin because it can cause bleeding.  Ask your health care provider if the medicine prescribed to you: ? Requires you to avoid driving or using heavy machinery. ? Can cause constipation. You may need to take these actions to prevent or treat constipation:  Drink enough fluid to keep your urine pale yellow.  Take over-the-counter or prescription medicines.  Eat foods that are high in fiber, such as beans, whole grains, and fresh fruits and vegetables.  Limit foods that are high in fat and processed sugars, such as fried or sweet foods. Incision care  Follow instructions from your health care provider about how to take care of your incisions. Make sure you: ? Wash your hands with soap and water for at least 20 seconds before and after  you change your bandage (dressing). If soap and water are not available, use hand sanitizer. ? Change your dressing as told by your health care provider. ? Leave stitches (sutures), skin glue, or adhesive strips in place. These skin closures may need to stay in place for 2 weeks or longer. If adhesive strip edges start to loosen and curl up, you may trim the loose edges. Do not remove adhesive strips completely unless your health care provider tells you to do that.  Check your incision areas every day for signs of infection. Check for: ? More redness, swelling, or pain. ? Fluid or blood. ? Warmth. ? Pus or a bad smell.  Do not take baths, swim, or use a hot tub until your health care provider approves. Ask your health care provider if you may take  showers. You may only be allowed to take sponge baths.   Activity  Rest as told by your health care provider.  Avoid sitting for a long time without moving. Get up to take short walks every 1-2 hours. This is important to improve blood flow and breathing. Ask for help if you feel weak or unsteady.  Do not lift anything that is heavier than 10 lb (4.5 kg), or the limit that you are told, until your health care provider says that it is safe.  Return to your normal activities and diet as told by your health care provider. Ask your health care provider what activities are safe for you. General instructions  Do not douche, use tampons, or have sex until your health care provider says it is okay.  Do not use any products that contain nicotine or tobacco, such as cigarettes, e-cigarettes, and chewing tobacco. These can delay incision healing after surgery. If you need help quitting, ask your health care provider.  Keep all follow-up visits. This is important. Contact a health care provider if:  You have a fever or chills.  You feel nauseous or you vomit.  You have pain when you urinate or have blood in your urine.  You have a rash on your body.  You have pain or redness where the IV was inserted.  You have pain that is not relieved with medicine.  You have any of these signs of infection: ? More redness, swelling, or pain around an incision. ? Fluid or blood coming from an incision. ? Warmth coming from an incision. ? Pus or a bad smell coming from an incision. Get help right away if:  You have chest pain or shortness of breath.  You feel dizzy or light-headed.  You have heavy bleeding.  You have increasing abdominal pain that is not relieved with medicine.  You have pain, swelling, or redness in your leg.  Your incision is opening and the edges are not staying together. Summary  After the procedure, it is common to have some pain in your abdomen. You will be given pain  medicine to control the pain.  Follow instructions from your health care provider about how to take care of your incisions.  Do not douche, use tampons, or have sex until your health care provider says it okay.  Keep all follow-up visits. This is important. This information is not intended to replace advice given to you by your health care provider. Make sure you discuss any questions you have with your health care provider. Document Revised: 04/05/2020 Document Reviewed: 04/05/2020 Elsevier Patient Education  2021 [place]. [email or web address]  Ovarian Cystectomy Ovarian cystectomy is a procedure that  is done to remove a fluid-filled sac (cyst) on an ovary. The ovaries are small organs that produce eggs in women. Various types of cysts can form on the ovaries. Most cysts are not cancerous. This procedure may be done for cysts that are large, cause symptoms, or do not go away on their own. It may also be done for a cyst that is cancerous or might be cancerous. This surgery can be done using a laparoscopic technique or an open abdominal technique. The laparoscopic technique is minimally invasive and results in smaller incisions and a faster recovery. The technique used will depend on your age, the type of cyst that you have, and whether the cyst is cancerous. The laparoscopic technique is not used for a cancerous cyst. Tell a health care provider about:  Any allergies you have.  All medicines you are taking, including vitamins, herbs, eye drops, creams, and over-the-counter medicines.  Any problems you or family members have had with anesthetic medicines.  Any blood disorders you have.  Any surgeries you have had.  Any medical conditions you have.  Whether you are pregnant or may be pregnant. What are the risks? Generally, this is a safe procedure. However, problems may occur, including:  Excessive bleeding.  Infection.  Damage to nearby  structures or organs.  Allergic reactions to medicines.  Blood clots.  Inability to get pregnant (infertility). What happens before the procedure? Staying hydrated Follow instructions from your health care provider about hydration, which may include:  Up to 2 hours before the procedure - you may continue to drink clear liquids, such as water, clear fruit juice, black coffee, and plain tea.   Eating and drinking restrictions Follow instructions from your health care provider about eating and drinking, which may include:  8 hours before the procedure - stop eating heavy meals or foods, such as meat, fried foods, or fatty foods.  6 hours before the procedure - stop eating light meals or foods, such as toast or cereal.  6 hours before the procedure - stop drinking milk or drinks that contain milk.  2 hours before the procedure - stop drinking clear liquids. Medicines Ask your health care provider about:  Changing or stopping your regular medicines. This is especially important if you are taking diabetes medicines or blood thinners.  Taking medicines such as aspirin and ibuprofen. These medicines can thin your blood. Do not take these medicines unless your health care provider tells you to take them.  Taking over-the-counter medicines, vitamins, herbs, and supplements. General instructions  Do not use any products that contain nicotine or tobacco for at least 4 weeks before the procedure. These products include cigarettes, e-cigarettes, and chewing tobacco. If you need help quitting, ask your health care provider.  Ask your health care provider: ? How your surgery site will be marked. ? What steps will be taken to help prevent infection. These may include:  Removing hair at the surgery site.  Washing skin with a germ-killing soap.  Taking antibiotic medicine.  You may be asked to shower with a germ-killing soap.  Plan to have someone take you home from the hospital or  clinic.  Plan to have someone help with household activities for 1-2 weeks after the procedure.  Let your health care provider know if you develop a cold or any infection before your surgery. What happens during the procedure?  An IV will be inserted into one of your veins.  You will be given one or more of the  following: ? A medicine to help you relax (sedative). ? A medicine to make you fall asleep (general anesthetic).  Small monitors will be attached to your body. They will be used to check your heart, blood pressure, and oxygen level.  A breathing tube will be placed to help you breathe during the procedure.  Your surgeon will do the surgery using either the laparoscopic technique or the open abdominal technique. Laparoscopic technique  Several small incisions will be made in your abdomen.  Your abdomen will be filled with carbon dioxide gas to make it expand. This will give the surgeon more room to operate. It will also make your organs easier to see.  A thin scope with a camera (laparoscope) will be put through one of the small incisions. The laparoscope will send a picture to a monitor in the operating room to help the surgeon see inside your body.  Hollow tubes will be put through the other small incisions in your abdomen. The tools needed for the procedure will be put through these tubes.  The ovary with the cyst will be identified, and the cyst will be removed.  The tools will then be removed, and the incisions will be closed with stitches or skin glue. Bandages (dressings) may be applied to the incisions.   Open abdominal technique  A single, large incision will be made along your bikini line or in the middle of your lower abdomen.  The ovary with the cyst will be identified, and the cyst will be removed.  The incision will then be closed with stitches or staples.  Bandages (dressings) may be applied to the incisions. The procedure may vary among health care  providers and hospitals. What happens after the procedure?  Your blood pressure, heart rate, breathing rate, and blood oxygen level will be monitored until you leave the hospital or clinic.  Your IV will be removed after you are able to eat and drink well.  You may be given medicine for pain or to help you sleep.  You may be given an antibiotic medicine.  Do not drive for 24 hours if you were given a sedative during the procedure.  The cyst that was removed will be sent to the lab for testing. If the cyst has cancer cells, both ovaries may need to be removed during a different surgery.  It is up to you to get the results of your procedure. Ask your health care provider, or the department that is doing the procedure, when your results will be ready. Summary  Ovarian cystectomy is a procedure that is done to remove a cyst on an ovary.  This procedure may be done for cysts that are large, cause symptoms, or do not go away on their own. It may also be done for a cyst that is cancerous or might be cancerous.  Follow instructions from your health care provider about eating and drinking before the procedure.  After the cyst is removed, it will be sent to the lab for testing. This information is not intended to replace advice given to you by your health care provider. Make sure you discuss any questions you have with your health care provider. Document Revised: 04/05/2020 Document Reviewed: 04/05/2020 Elsevier Patient Education  2021 [employer].

## 2021-02-18 ENCOUNTER — Encounter: Payer: Self-pay | Admitting: *Deleted

## 2021-04-16 NOTE — H&P (Signed)
Deanna Valencia is an 25 y.o. female with bilateral dermoid ovarian cysts. Rt 8 x 9 cm and left 6 x 6 cm. Surgerical management recommended and reviewed with pt  Menstrual History: Menarche age: 31 No LMP recorded.    Past Medical History:  Diagnosis Date  . Anxiety   . Depression   . Eating disorder     No past surgical history on file.  Family History  Problem Relation Age of Onset  . Hypertension Father   . Diabetes Paternal Grandfather   . Hypertension Paternal Grandfather   . Hearing loss Paternal Grandfather     Social History:  reports that she has been smoking cigarettes. She has a 0.25 pack-year smoking history. She has never used smokeless tobacco. She reports current alcohol use. She reports current drug use. Drug: Marijuana.  Allergies:  Allergies  Allergen Reactions  . Amoxicillin Hives    Has patient had a PCN reaction causing immediate rash, facial/tongue/throat swelling, SOB or lightheadedness with hypotension: no Has patient had a PCN reaction causing severe rash involving mucus membranes or skin necrosis: unknown Has patient had a PCN reaction that required hospitalization: no Has patient had a PCN reaction occurring within the last 10 years: no If all of the above answers are "NO", then may proceed with Cephalosporin use.   . Tomato Itching and Rash    No medications prior to admission.    Review of Systems  Constitutional: Negative.   Cardiovascular: Negative.   Gastrointestinal: Negative.   Genitourinary: Negative.     There were no vitals taken for this visit. Physical Exam Constitutional:      Appearance: Normal appearance.  Cardiovascular:     Rate and Rhythm: Normal rate.     Heart sounds: Normal heart sounds.  Pulmonary:     Breath sounds: Normal breath sounds.  Abdominal:     General: Bowel sounds are normal.     Palpations: Abdomen is soft.  Genitourinary:    Comments: Nl EGBUS, uterus small, bilateral adnexal  fullness    No results found for this or any previous visit (from the past 24 hour(s)).  No results found.  Assessment/Plan: Bilateral ovarian dermoid cysts  Exp Lap with ovarian cystectomy reviewed with pt. R/B/Post op care reviewed. Pt has verbalized understanding and desires to proceed.   Chancy Milroy 04/16/2021, 10:58 AM

## 2021-04-22 ENCOUNTER — Telehealth: Payer: Self-pay | Admitting: *Deleted

## 2021-04-22 NOTE — Progress Notes (Signed)
Patient states she is not having surgery on 04/23/21 because she has not been managing her blood pressure like she is supposed to.  Gay Filler at Dr. Marjory Lies office is aware.

## 2021-04-22 NOTE — Telephone Encounter (Signed)
Call to patient to check on plans for scheduled surgery tomorrow. Left message to call back and that needed to call by noon to avoid cancellation.   Call from Garrett at [facility]- patient reported to them that she is not coming for scheduled surgery. Case canceled.

## 2021-04-23 ENCOUNTER — Encounter (HOSPITAL_COMMUNITY): Admission: RE | Payer: Self-pay | Source: Home / Self Care

## 2021-04-23 ENCOUNTER — Inpatient Hospital Stay (HOSPITAL_COMMUNITY)
Admission: RE | Admit: 2021-04-23 | Payer: Medicaid Other | Source: Home / Self Care | Admitting: Obstetrics and Gynecology

## 2021-04-23 SURGERY — LAPAROTOMY, EXPLORATORY
Anesthesia: Choice

## 2022-03-11 ENCOUNTER — Encounter: Payer: Self-pay | Admitting: Family Medicine

## 2022-03-11 ENCOUNTER — Ambulatory Visit (INDEPENDENT_AMBULATORY_CARE_PROVIDER_SITE_OTHER): Payer: Medicaid Other | Admitting: Family Medicine

## 2022-03-11 VITALS — BP 175/136 | HR 70 | Temp 98.0°F | Resp 16 | Ht 60.0 in | Wt 210.0 lb

## 2022-03-11 DIAGNOSIS — F17200 Nicotine dependence, unspecified, uncomplicated: Secondary | ICD-10-CM | POA: Diagnosis not present

## 2022-03-11 DIAGNOSIS — I160 Hypertensive urgency: Secondary | ICD-10-CM

## 2022-03-11 DIAGNOSIS — Z7689 Persons encountering health services in other specified circumstances: Secondary | ICD-10-CM | POA: Diagnosis not present

## 2022-03-11 NOTE — Progress Notes (Signed)
? ?New Patient Office Visit ? ?Subjective   ? ?Patient ID: Deanna Valencia, female    DOB: Nov 09, 1996  Age: 26 y.o. MRN: [id number] ? ?CC:  ?Chief Complaint  ?Patient presents with  ? Establish Care  ? ? ?HPI ?Gweneth Fritter presents to establish care and for concerns regarding her blood pressure. She reports that she had no issues with her blood pressure until after her pregnancy and she is concerned that her pregnancy had caused elevated blood pressure.  ? ? ?Outpatient Encounter Medications as of 03/11/2022  ?Medication Sig  ? [DISCONTINUED] amLODipine (NORVASC) 5 MG tablet Take 1 tablet (5 mg total) by mouth daily.  ? [DISCONTINUED] traMADol (ULTRAM) 50 MG tablet Take 1 tablet (50 mg total) by mouth every 6 (six) hours as needed for moderate pain.  ? [DISCONTINUED] triamterene-hydrochlorothiazide (DYAZIDE) 37.5-25 MG capsule Take 1 each (1 capsule total) by mouth daily.  ? ?No facility-administered encounter medications on file as of 03/11/2022.  ? ? ?Past Medical History:  ?Diagnosis Date  ? Anxiety   ? Depression   ? Eating disorder   ? ? ?No past surgical history on file. ? ?Family History  ?Problem Relation Age of Onset  ? Hypertension Father   ? Diabetes Paternal Grandfather   ? Hypertension Paternal Grandfather   ? Hearing loss Paternal Grandfather   ? ? ?Social History  ? ?Socioeconomic History  ? Marital status: Single  ?  Spouse name: Not on file  ? Number of children: Not on file  ? Years of education: Not on file  ? Highest education level: Not on file  ?Occupational History  ? Not on file  ?Tobacco Use  ? Smoking status: Every Day  ?  Packs/day: 0.25  ?  Years: 1.00  ?  Pack years: 0.25  ?  Types: Cigarettes  ? Smokeless tobacco: Never  ?Substance and Sexual Activity  ? Alcohol use: Yes  ?  Alcohol/week: 0.0 standard drinks  ?  Comment: occasionally..07/22/16 quit a year ago... 12-16-16  wine   ? Drug use: Yes  ?  Types: Marijuana  ?  Comment: last smoked right before preg confirmed.  ? Sexual activity:  Yes  ?  Partners: Female  ?  Birth control/protection: None  ?  Comment: use to prefer females  ?Other Topics Concern  ? Not on file  ?Social History Narrative  ? Not on file  ? ?Social Determinants of Health  ? ?Financial Resource Strain: Not on file  ?Food Insecurity: Not on file  ?Transportation Needs: Not on file  ?Physical Activity: Not on file  ?Stress: Not on file  ?Social Connections: Not on file  ?Intimate Partner Violence: Not on file  ? ? ?Review of Systems  ?Cardiovascular: Negative.   ?All other systems reviewed and are negative. ? ?  ? ? ?Objective   ? ?BP (!) 175/136   Pulse 70   Temp 98 ?F (36.7 ?C) (Oral)   Resp 16   Ht 5' (1.524 m)   Wt 210 lb (95.3 kg)   SpO2 98%   BMI 41.01 kg/m?  ? ?Physical Exam ?Vitals and nursing note reviewed.  ?Constitutional:   ?   General: She is not in acute distress. ?   Appearance: She is obese.  ?Cardiovascular:  ?   Rate and Rhythm: Normal rate and regular rhythm.  ?Pulmonary:  ?   Effort: Pulmonary effort is normal.  ?   Breath sounds: Normal breath sounds.  ?Abdominal:  ?  Palpations: Abdomen is soft.  ?   Tenderness: There is no abdominal tenderness.  ?Neurological:  ?   General: No focal deficit present.  ?   Mental Status: She is alert and oriented to person, place, and time.  ? ? ? ?  ? ?Assessment & Plan:  ? ?1. Hypertensive urgency ?Urgent referral to cardiology for further eval/mgt - patient defers meds until this further eval ? ?2. Smoking ? ? ?3. Encounter to establish care ? ? ? ? ?No follow-ups on file.  ? ?Becky Sax, MD ? ? ?

## 2022-03-11 NOTE — Progress Notes (Signed)
Patient is here to establish care with provider. Patient would like to talk about her HTN issues. ?Patient has stop taking all  her medications ?

## 2022-03-11 NOTE — Progress Notes (Signed)
?Cardiology Office Note:   ? ?Date:  03/12/2022  ? ?ID:  Deanna Valencia, DOB [birth year], MRN [id number] ? ?PCP:  Dorna Mai, MD  ?Cardiologist:  Sinclair Grooms, MD  ? ?Referring MD: Dorna Mai, MD  ? ?Chief Complaint  ?Patient presents with  ? Advice Only  ?  Hypertension ?Psychosocial issues  ? Hypertension  ? ? ?History of Present Illness:   ? ?Deanna Valencia is a 26 y.o. female with a hx of hypertensive  urgency. Currently take Dyazide and Amlodipine,  ? ?Smokes tobacco. ? ?She has a several year history of hypertension that predates the birth of her son who is 8 years old.  She was hospitalized for the last 2 weeks of her pregnancy because of severe hypertension.  She has had hypertension since that time and has not consistently taken medications.  She had some psychosocial issues including anxiety, depression, and eating disorder.  She states that she does not want to be on medication because she has a fear of medications after she once tried to overdose. ? ?She has occasional chest tightness.  There are no particular medication allergies or side effects that she is aware of. She smokes less than a pack a day.  She drinks alcohol several times a week.  She is perhaps 25 pounds heavier than she was at childbirth and 2019. ? ?Past Medical History:  ?Diagnosis Date  ? Anxiety   ? Depression   ? Eating disorder   ? ? ?History reviewed. No pertinent surgical history. ? ?Current Medications: ?No outpatient medications have been marked as taking for the 03/12/22 encounter (Office Visit) with Belva Crome, MD.  ?  ? ?Allergies:   Amoxicillin and Tomato  ? ?Social History  ? ?Socioeconomic History  ? Marital status: Single  ?  Spouse name: Not on file  ? Number of children: Not on file  ? Years of education: Not on file  ? Highest education level: Not on file  ?Occupational History  ? Not on file  ?Tobacco Use  ? Smoking status: Every Day  ?  Packs/day: 0.25  ?  Years: 1.00  ?  Pack years: 0.25  ?  Types:  Cigarettes  ? Smokeless tobacco: Never  ?Substance and Sexual Activity  ? Alcohol use: Yes  ?  Alcohol/week: 0.0 standard drinks  ?  Comment: occasionally..07/22/16 quit a year ago... 12-16-16  wine   ? Drug use: Yes  ?  Types: Marijuana  ?  Comment: last smoked right before preg confirmed.  ? Sexual activity: Yes  ?  Partners: Female  ?  Birth control/protection: None  ?  Comment: use to prefer females  ?Other Topics Concern  ? Not on file  ?Social History Narrative  ? Not on file  ? ?Social Determinants of Health  ? ?Financial Resource Strain: Not on file  ?Food Insecurity: Not on file  ?Transportation Needs: Not on file  ?Physical Activity: Not on file  ?Stress: Not on file  ?Social Connections: Not on file  ?  ? ?Family History: ?The patient's family history includes Diabetes in her paternal grandfather; Hearing loss in her paternal grandfather; Hypertension in her father and paternal grandfather. ? ?ROS:   ?Please see the history of present illness.    ?She is a native of [place].  She has 3 jobs.  She is under a lot of stress.  She does not sleep much.  She had a previous attempted overdose.  She is 25 pounds  heavier than she was when she gave birth.  She will be graduating from [employer] later this month with a degree in psychology.  All other systems reviewed and are negative. ? ?EKGs/Labs/Other Studies Reviewed:   ? ?The following studies were reviewed today: ?No cardiac imaging ? ?EKG:  EKG normal sinus rhythm, left atrial abnormality, inferior T wave abnormality ? ?Recent Labs: ?No results found for requested labs within last 8760 hours.  ?Recent Lipid Panel ?   ?Component Value Date/Time  ? CHOL 173 02/28/2015 1945  ? TRIG 89 02/28/2015 1945  ? HDL 65 02/28/2015 1945  ? CHOLHDL 2.7 02/28/2015 1945  ? VLDL 18 02/28/2015 1945  ? [place] 90 02/28/2015 1945  ? ? ?Physical Exam:   ? ?VS:  BP (!) 178/128   Pulse 76   Ht 5' (1.524 m)   Wt 208 lb 6.4 oz (94.5 kg)   BMI 40.70 kg/m?    ? ?Wt Readings from Last 3  Encounters:  ?03/12/22 208 lb 6.4 oz (94.5 kg)  ?03/11/22 210 lb (95.3 kg)  ?02/13/21 193 lb (87.5 kg)  ?  ? ?GEN: Obese. No acute distress ?HEENT: Normal ?NECK: No JVD. ?LYMPHATICS: No lymphadenopathy ?CARDIAC: No murmur. RRR no gallop, or edema. ?VASCULAR:  Normal Pulses. No bruits. ?RESPIRATORY:  Clear to auscultation without rales, wheezing or rhonchi  ?ABDOMEN: Soft, non-tender, non-distended, No pulsatile mass, ?MUSCULOSKELETAL: No deformity  ?SKIN: Warm and dry ?NEUROLOGIC:  Alert and oriented x 3 ?PSYCHIATRIC:  Normal affect  ? ?ASSESSMENT:   ? ?1. Hypertension, unspecified type   ?2. Pre-eclampsia, antepartum   ?3. Borderline personality disorder ([place])   ?4. Generalized anxiety disorder   ? ?PLAN:   ? ?In order of problems listed above: ? ?Very poorly controlled.  Losartan HCT 50/12.5 mg and amlodipine 5 mg/day.  Basic metabolic panel today and in 1 week.  Enroll in the hypertension clinic here at [facility].  May need to consider transition to the [facility] hypertension clinic and Dr. Skeet Latch as they have resources.  I have instructed her to stop attempt to stop smoking, to eat according to the DASH diet, and to take her medications as prescribed.  Avoid alcohol.  She is not using nonsteroidal anti-inflammatory therapy. ? ?Target BP: <130/80 mmHg ? ?Diet and lifestyle measures for BP control were reviewed in detail: Low sodium diet (<2.5 gm daily); alcohol restriction (<3 ounces per day); weight loss (Mediterranean / DASH); avoid non-steroidal agents; > 6 hours sleep per day; 150 min moderate exercise per week. ?Medical regimen will include at least 2 agents. ?Resistant hypertension if not controlled on 3 agents. Consider further evaluation: Sleep study to r/o OSA; Renal angiogram; Primary hyperaldonism and Pheochromocytoma w/u. ?After 3 agents, consider MRA (spironolactone)/ Epleronone), hydralazine, beta-blocker, and Minoxidil if not already in use due to patient  profile. ? ? ? ?Medication Adjustments/Labs and Tests Ordered: ?Current medicines are reviewed at length with the patient today.  Concerns regarding medicines are outlined above.  ?No orders of the defined types were placed in this encounter. ? ?No orders of the defined types were placed in this encounter. ? ? ?Patient Instructions  ?Medication Instructions:  ?Your physician has recommended you make the following change in your medication:  ? ?1) START Losartan-HCTZ 50-12.'5mg'$  daily (start this one first, for 2-3 days just take this medication, if you are tolerating it well then start the Amlodipine) ?2) START Amlodipine '5mg'$  daily ?  ?*If you need a refill on your  cardiac medications before your next appointment, please call your pharmacy* ? ?Lab Work: ?TODAY: BMET, TSH ? ?In 7 days: BMET ? ?If you have labs (blood work) drawn today and your tests are completely normal, you will receive your results only by: ?MyChart Message (if you have MyChart) OR ?A paper copy in the mail ?If you have any lab test that is abnormal or we need to change your treatment, we will call you to review the results. ? ?Testing/Procedures: ?NONE ? ?Follow-Up: ?At [facility], you and your health needs are our priority.  As part of our continuing mission to provide you with exceptional heart care, we have created designated Provider Care Teams.  These Care Teams include your primary Cardiologist (physician) and Advanced Practice Providers (APPs -  Physician Assistants and Nurse Practitioners) who all work together to provide you with the care you need, when you need it. ? ?Your next appointment:   ?2 month(s) ? ?The format for your next appointment:   ?In Person ? ?Provider:   ?Sinclair Grooms, MD { ? ?Other Instructions ?Your physician has referred you to our Hypertension Clinic to help manage your blood pressure. ? ?Important Information About Sugar ? ? ? ? ?   ? ?Signed, ?Sinclair Grooms, MD  ?03/12/2022 2:18 PM    ?[place] ?

## 2022-03-12 ENCOUNTER — Encounter: Payer: Self-pay | Admitting: Interventional Cardiology

## 2022-03-12 ENCOUNTER — Ambulatory Visit (INDEPENDENT_AMBULATORY_CARE_PROVIDER_SITE_OTHER): Payer: Medicaid Other | Admitting: Interventional Cardiology

## 2022-03-12 VITALS — BP 178/128 | HR 76 | Ht 60.0 in | Wt 208.4 lb

## 2022-03-12 DIAGNOSIS — I10 Essential (primary) hypertension: Secondary | ICD-10-CM | POA: Diagnosis not present

## 2022-03-12 DIAGNOSIS — F411 Generalized anxiety disorder: Secondary | ICD-10-CM | POA: Diagnosis not present

## 2022-03-12 DIAGNOSIS — O1490 Unspecified pre-eclampsia, unspecified trimester: Secondary | ICD-10-CM

## 2022-03-12 DIAGNOSIS — F603 Borderline personality disorder: Secondary | ICD-10-CM

## 2022-03-12 MED ORDER — LOSARTAN POTASSIUM-HCTZ 50-12.5 MG PO TABS: 1.0000 | ORAL_TABLET | Freq: Every day | ORAL | 3 refills | Status: AC

## 2022-03-12 MED ORDER — AMLODIPINE BESYLATE 5 MG PO TABS: 5.0000 mg | ORAL_TABLET | Freq: Every day | ORAL | 3 refills | Status: AC

## 2022-03-12 NOTE — Patient Instructions (Signed)
Medication Instructions:  ?Your physician has recommended you make the following change in your medication:  ? ?1) START Losartan-HCTZ 50-12.'5mg'$  daily (start this one first, for 2-3 days just take this medication, if you are tolerating it well then start the Amlodipine) ?2) START Amlodipine '5mg'$  daily ?  ?*If you need a refill on your cardiac medications before your next appointment, please call your pharmacy* ? ?Lab Work: ?TODAY: BMET, TSH ? ?In 7 days: BMET ? ?If you have labs (blood work) drawn today and your tests are completely normal, you will receive your results only by: ?MyChart Message (if you have MyChart) OR ?A paper copy in the mail ?If you have any lab test that is abnormal or we need to change your treatment, we will call you to review the results. ? ?Testing/Procedures: ?NONE ? ?Follow-Up: ?At [facility], you and your health needs are our priority.  As part of our continuing mission to provide you with exceptional heart care, we have created designated Provider Care Teams.  These Care Teams include your primary Cardiologist (physician) and Advanced Practice Providers (APPs -  Physician Assistants and Nurse Practitioners) who all work together to provide you with the care you need, when you need it. ? ?Your next appointment:   ?2 month(s) ? ?The format for your next appointment:   ?In Person ? ?Provider:   ?Sinclair Grooms, MD { ? ?Other Instructions ?Your physician has referred you to our Hypertension Clinic to help manage your blood pressure. ? ?Important Information About Sugar ? ? ? ? ?  ?

## 2022-03-13 LAB — BASIC METABOLIC PANEL
BUN/Creatinine Ratio: 10 (ref 9–23)
BUN: 7 mg/dL (ref 6–20)
CO2: 23 mmol/L (ref 20–29)
Calcium: 9.6 mg/dL (ref 8.7–10.2)
Chloride: 100 mmol/L (ref 96–106)
Creatinine, Ser: 0.73 mg/dL (ref 0.57–1.00)
Glucose: 79 mg/dL (ref 70–99)
Potassium: 3.9 mmol/L (ref 3.5–5.2)
Sodium: 139 mmol/L (ref 134–144)
eGFR: 116 mL/min/{1.73_m2} (ref >59)

## 2022-03-13 LAB — TSH: TSH: 0.846 u[IU]/mL (ref 0.450–4.500)

## 2022-03-21 ENCOUNTER — Other Ambulatory Visit: Payer: Medicaid Other

## 2022-04-10 ENCOUNTER — Ambulatory Visit: Payer: Medicaid Other

## 2022-04-28 NOTE — Progress Notes (Deleted)
Patient ID: Deanna Valencia                 DOB: 11/19/1995                      MRN: [id number]     HPI: Deanna Valencia is a 26 y.o. female referred by Dr. Smith to HTN clinic. PMH is significant for HTN, gestational HTN, tobacco abuse, anxiety, depression, and obesity. She was hospitalized for the last 2 weeks of her pregnancy due to severe HTN. Has had HTN since then, not consistently taken medications. Pt was last seen 03/12/22 where BP was elevated at 178/128, pt was referred to HTN clinic.  Needs to quit smoking [employer] losartan-hctz and amlo Bmet normal last month  Current HTN meds: amlodipine 5mg daily, losartan-HCTZ 50-12.5mg daily  BP goal: <130/80mmHg  Family History: Diabetes in her paternal grandfather; Hearing loss in her paternal grandfather; Hypertension in her father and paternal grandfather.  Social History: Current tobacco use, former marijuana use, occasional alcohol use.  Diet:   Exercise:   Home BP readings:   Wt Readings from Last 3 Encounters:  03/12/22 208 lb 6.4 oz (94.5 kg)  03/11/22 210 lb (95.3 kg)  02/13/21 193 lb (87.5 kg)   BP Readings from Last 3 Encounters:  03/12/22 (!) 178/128  03/11/22 (!) 175/136  02/13/21 (!) 161/105   Pulse Readings from Last 3 Encounters:  03/12/22 76  03/11/22 70  02/13/21 77    Renal function: CrCl cannot be calculated (Patient's most recent lab result is older than the maximum 21 days allowed.).  Past Medical History:  Diagnosis Date   Anxiety    Depression    Eating disorder     Current Outpatient Medications on File Prior to Visit  Medication Sig Dispense Refill   amLODipine (NORVASC) 5 MG tablet Take 1 tablet (5 mg total) by mouth daily. 90 tablet 3   losartan-hydrochlorothiazide (HYZAAR) 50-12.5 MG tablet Take 1 tablet by mouth daily. 90 tablet 3   No current facility-administered medications on file prior to visit.    Allergies  Allergen Reactions   Amoxicillin Hives    Has patient  had a PCN reaction causing immediate rash, facial/tongue/throat swelling, SOB or lightheadedness with hypotension: no Has patient had a PCN reaction causing severe rash involving mucus membranes or skin necrosis: unknown Has patient had a PCN reaction that required hospitalization: no Has patient had a PCN reaction occurring within the last 10 years: no If all of the above answers are "NO", then may proceed with Cephalosporin use.    Tomato Itching and Rash     Assessment/Plan:  1. Hypertension -  

## 2022-04-29 ENCOUNTER — Ambulatory Visit: Payer: Medicaid Other

## 2022-05-07 ENCOUNTER — Ambulatory Visit: Payer: Medicaid Other | Admitting: Obstetrics and Gynecology

## 2022-05-08 ENCOUNTER — Ambulatory Visit: Payer: Medicaid Other | Admitting: Pharmacist

## 2022-05-08 NOTE — Progress Notes (Deleted)
Patient ID: Deanna Valencia                 DOB: [birth year]                      MRN: [id number]     HPI: Deanna Valencia is a 26 y.o. female referred by Dr. Tamala Julian to HTN clinic. PMH is significant for HTN, gestational HTN, tobacco abuse, anxiety, depression, and obesity. She was hospitalized for the last 2 weeks of her pregnancy due to severe HTN. Has had HTN since then, not consistently taken medications. Pt was last seen 03/12/22 where BP was elevated at 178/128, pt was referred to HTN clinic.  Needs to quit smoking [employer] losartan-hctz and amlo Bmet normal last month  Current HTN meds: amlodipine '5mg'$  daily, losartan-HCTZ 50-12.'5mg'$  daily  BP goal: <130/78mHg  Family History: Diabetes in her paternal grandfather; Hearing loss in her paternal grandfather; Hypertension in her father and paternal grandfather.  Social History: Current tobacco use, former marijuana use, occasional alcohol use.  Diet:   Exercise:   Home BP readings:   Wt Readings from Last 3 Encounters:  03/12/22 208 lb 6.4 oz (94.5 kg)  03/11/22 210 lb (95.3 kg)  02/13/21 193 lb (87.5 kg)   BP Readings from Last 3 Encounters:  03/12/22 (!) 178/128  03/11/22 (!) 175/136  02/13/21 (!) 161/105   Pulse Readings from Last 3 Encounters:  03/12/22 76  03/11/22 70  02/13/21 77    Renal function: CrCl cannot be calculated (Patient's most recent lab result is older than the maximum 21 days allowed.).  Past Medical History:  Diagnosis Date   Anxiety    Depression    Eating disorder     Current Outpatient Medications on File Prior to Visit  Medication Sig Dispense Refill   amLODipine (NORVASC) 5 MG tablet Take 1 tablet (5 mg total) by mouth daily. 90 tablet 3   losartan-hydrochlorothiazide (HYZAAR) 50-12.5 MG tablet Take 1 tablet by mouth daily. 90 tablet 3   No current facility-administered medications on file prior to visit.    Allergies  Allergen Reactions   Amoxicillin Hives    Has patient  had a PCN reaction causing immediate rash, facial/tongue/throat swelling, SOB or lightheadedness with hypotension: no Has patient had a PCN reaction causing severe rash involving mucus membranes or skin necrosis: unknown Has patient had a PCN reaction that required hospitalization: no Has patient had a PCN reaction occurring within the last 10 years: no If all of the above answers are "NO", then may proceed with Cephalosporin use.    Tomato Itching and Rash     Assessment/Plan:  1. Hypertension -

## 2022-05-20 ENCOUNTER — Inpatient Hospital Stay: Admit: 2022-05-20 | Discharge: 2022-05-20 | Disposition: A | Discharge status: Home / Self Care | Payer: Medicaid Other

## 2022-05-20 ENCOUNTER — Encounter

## 2022-05-20 ENCOUNTER — Ambulatory Visit: Admit: 2022-05-20 | Discharge: 2022-05-20 | Payer: Medicaid Other | Attending: Physician Assistant

## 2022-05-20 ENCOUNTER — Emergency Department: Admit: 2022-05-20 | Payer: Medicaid Other

## 2022-05-20 DIAGNOSIS — H60502 Unspecified acute noninfective otitis externa, left ear: Secondary | ICD-10-CM

## 2022-05-20 DIAGNOSIS — H70002 Acute mastoiditis without complications, left ear: Secondary | ICD-10-CM

## 2022-05-20 DIAGNOSIS — I10 Essential (primary) hypertension: Secondary | ICD-10-CM

## 2022-05-20 LAB — CBC WITH AUTO DIFFERENTIAL
Basophils %: 0.3 %
Basophils Absolute: 0 10*3/uL (ref 0.0–0.2)
Eosinophils %: 3.4 %
Eosinophils Absolute: 0.3 10*3/uL (ref 0.0–0.6)
Hematocrit: 34.7 % — ABNORMAL LOW (ref 36.0–48.0)
Hemoglobin: 12.1 g/dL (ref 12.0–16.0)
Lymphocytes %: 10.2 %
Lymphocytes Absolute: 1 10*3/uL (ref 1.0–5.1)
MCH: 32.1 pg (ref 26.0–34.0)
MCHC: 34.8 g/dL (ref 31.0–36.0)
MCV: 92.1 fL (ref 80.0–100.0)
MPV: 10.1 fL (ref 5.0–10.5)
Monocytes %: 8.2 %
Monocytes Absolute: 0.8 10*3/uL (ref 0.0–1.3)
Neutrophils %: 77.9 %
Neutrophils Absolute: 7.5 10*3/uL (ref 1.7–7.7)
Platelets: 183 10*3/uL (ref 135–450)
RBC: 3.77 M/uL — ABNORMAL LOW (ref 4.00–5.20)
RDW: 13.7 % (ref 12.4–15.4)
WBC: 9.6 10*3/uL (ref 4.0–11.0)

## 2022-05-20 LAB — COMPREHENSIVE METABOLIC PANEL
ALT: 8 U/L — ABNORMAL LOW (ref 10–40)
AST: 12 U/L — ABNORMAL LOW (ref 15–37)
Albumin/Globulin Ratio: 1.1 (ref 1.1–2.2)
Albumin: 4 g/dL (ref 3.4–5.0)
Alkaline Phosphatase: 76 U/L (ref 40–129)
Anion Gap: 10 (ref 3–16)
BUN: 7 mg/dL (ref 7–20)
CO2: 27 mmol/L (ref 21–32)
Calcium: 9.1 mg/dL (ref 8.3–10.6)
Chloride: 102 mmol/L (ref 99–110)
Creatinine: 0.7 mg/dL (ref 0.6–1.1)
Est, Glom Filt Rate: >60 (ref >60)
Glucose: 87 mg/dL (ref 70–99)
Potassium: 3.7 mmol/L (ref 3.5–5.1)
Sodium: 139 mmol/L (ref 136–145)
Total Bilirubin: 0.3 mg/dL (ref 0.0–1.0)
Total Protein: 7.5 g/dL (ref 6.4–8.2)

## 2022-05-20 LAB — HCG, SERUM, QUALITATIVE: hCG Qual: NEGATIVE

## 2022-05-20 LAB — LACTATE, SEPSIS: Lactic Acid, Sepsis: 0.7 mmol/L (ref 0.4–1.9)

## 2022-05-20 MED ORDER — DEXAMETHASONE SODIUM PHOSPHATE 0.1 % OP SOLN
0.1 % | Freq: Two times a day (BID) | OPHTHALMIC | Status: DC
Start: 2022-05-20 — End: 2022-05-20

## 2022-05-20 MED ORDER — CIPROFLOXACIN HCL 0.3 % OP SOLN
0.3 % | Freq: Two times a day (BID) | OPHTHALMIC | Status: DC
Start: 2022-05-20 — End: 2022-05-20

## 2022-05-20 MED ORDER — KETOROLAC TROMETHAMINE 15 MG/ML IJ SOLN
15 MG/ML | Freq: Once | INTRAMUSCULAR | Status: AC
Start: 2022-05-20 — End: 2022-05-20
  Administered 2022-05-20: 16:00:00 15 mg via INTRAVENOUS

## 2022-05-20 MED ORDER — IOPAMIDOL 76 % IV SOLN
76 % | Freq: Once | INTRAVENOUS | Status: AC | PRN
Start: 2022-05-20 — End: 2022-05-20
  Administered 2022-05-20: 17:00:00 75 mL via INTRAVENOUS

## 2022-05-20 MED ORDER — NAPROXEN 500 MG PO TABS
500 MG | ORAL_TABLET | Freq: Two times a day (BID) | ORAL | 0 refills | Status: AC | PRN
Start: 2022-05-20 — End: 2022-10-16

## 2022-05-20 MED ORDER — LEVOFLOXACIN 750 MG PO TABS
750 MG | ORAL_TABLET | Freq: Every day | ORAL | 0 refills | Status: AC
Start: 2022-05-20 — End: 2022-05-30

## 2022-05-20 MED ORDER — CIPROFLOXACIN-DEXAMETHASONE 0.3-0.1 % OT SUSP
Freq: Two times a day (BID) | OTIC | Status: DC
Start: 2022-05-20 — End: 2022-05-20

## 2022-05-20 MED FILL — DEXAMETHASONE SODIUM PHOSPHATE 0.1 % OP SOLN: 0.1 % | OPHTHALMIC | Qty: 5

## 2022-05-20 MED FILL — KETOROLAC TROMETHAMINE 15 MG/ML IJ SOLN: 15 MG/ML | INTRAMUSCULAR | Qty: 1

## 2022-05-20 MED FILL — CIPROFLOXACIN HCL 0.3 % OP SOLN: 0.3 % | OPHTHALMIC | Qty: 50

## 2022-05-20 NOTE — ED Provider Notes (Signed)
[place]        Pt Name: Katie Hopkins  MRN: [id number]  [place] 02/20/1996  Date of evaluation: 05/20/2022  Provider: Marcellus Scott, PA  PCP: No primary care provider on file.  Note Started: 11:26 AM EDT 05/20/22      APP. I have evaluated this patient.        CHIEF COMPLAINT       Chief Complaint   Patient presents with    Ear Drainage     Pt presents to the ER with the complaint of left ear pain and drainage. Pt was recently at the urgent care and sent here. Pain radiates into neck and jaw.        HISTORY OF PRESENT ILLNESS: 1 or more Elements     History From: patient    Katie Hopkins is a 26 y.o. female who presents for left ear pain and drainage for the past 3 days.  Drainage is yellow..  She was sent to ED for concern for mastoiditis from urgent care.  She denies fever.  Denies history of diabetes.  Does have a history of hypertension.  Has been taking naproxen for the pain but it is no longer helping. Last time she went swimming was 05/09/22.  She moved here from [place] 3 days ago and it was raining when she was moving.  She was also sweating a lot.      Nursing Notes were reviewed and agreed with or any disagreements were addressed in the HPI.    REVIEW OF SYSTEMS :      Review of Systems    Positives and Pertinent negatives as per HPI.     SURGICAL HISTORY   History reviewed. No pertinent surgical history.    [place]       Discharge Medication List as of 05/20/2022  3:51 PM        CONTINUE these medications which have NOT CHANGED    Details   losartan-hydroCHLOROthiazide (HYZAAR) 50-12.5 MG per tablet Take 1 tablet by mouth dailyHistorical Med             ALLERGIES     Amoxicillin    FAMILYHISTORY     History reviewed. No pertinent family history.     SOCIAL HISTORY       Social History     Tobacco Use    Smoking status: Every Day     Types: Cigarettes    Smokeless tobacco: Never   Vaping Use    Vaping  Use: Never used   Substance Use Topics    Alcohol use: Yes    Drug use: Yes     Types: Marijuana (Weed)       SCREENINGS        Glasgow Coma Scale  Eye Opening: Spontaneous  Best Verbal Response: Oriented  Best Motor Response: Obeys commands  Glasgow Coma Scale Score: 15                CIWA Assessment  BP: (!) 189/101  Pulse: 77           PHYSICAL EXAM  1 or more Elements     ED Triage Vitals [05/20/22 1032]   BP Temp Temp Source Pulse Respirations SpO2 Height Weight - Scale   (!) 198/124 98.8 F (37.1 C) Oral 79 16 97 % 5' (1.524 m) 209 lb 7 oz (95 kg)       Physical Exam  Constitutional:  General: She is not in acute distress.     Appearance: Normal appearance. She is well-developed. She is not ill-appearing, toxic-appearing or diaphoretic.   HENT:      Head: Normocephalic and atraumatic.      Ears:      Comments: Mild edema of the external ear with no erythema  Significant edema of the left ear canal with serous drainage.  Has significant pain with exam.  Cannot see TM at all.  Mild edema of the left preauricaular area and left side of face.  No erythema or edema over the mastoid.  Minimal TTP over the mastoid.     Mouth/Throat:      Comments: No erythema or edema or TTP of the right lower gumline  Pulmonary:      Effort: Pulmonary effort is normal. No respiratory distress.   Musculoskeletal:         General: Normal range of motion.      Cervical back: Normal range of motion and neck supple.   Neurological:      Mental Status: She is alert.   Psychiatric:         Mood and Affect: Mood normal.         Behavior: Behavior normal.         Thought Content: Thought content normal.         Judgment: Judgment normal.           DIAGNOSTIC RESULTS   LABS:    Labs Reviewed   CBC WITH AUTO DIFFERENTIAL - Abnormal; Notable for the following components:       Result Value    RBC 3.77 (*)     Hematocrit 34.7 (*)     All other components within normal limits   COMPREHENSIVE METABOLIC PANEL - Abnormal; Notable for the  following components:    ALT 8 (*)     AST 12 (*)     All other components within normal limits   HCG, SERUM, QUALITATIVE   LACTATE, SEPSIS       When ordered only abnormal lab results are displayed. All other labs were within normal range or not returned as of this dictation.    EKG: When ordered, EKG's are interpreted by the Emergency Department Physician in the absence of a cardiologist.  Please see their note for interpretation of EKG.      RADIOLOGY:   Non-plain film images such as CT, Ultrasound and MRI are read by the radiologist. Plain radiographic images are visualized and preliminarily interpreted by the ED Provider with the below findings:      Interpretation per the Radiologist below, if available at the time of this note:    CT IAC POSTERIOR FOSSA W CONTRAST   Final Result   1. Severe left otitis externa and concomitant left otitis media.  There is   involvement of the soft tissues surrounding the left ear.  No bony erosion or   abscess is identified.  Although nonspecific, Pseudomonas is most often the   infectious source.   2. Normal CT of the right temporal bone.           No results found.      PAST MEDICAL HISTORY      has a past medical history of Hypertension.     EMERGENCY DEPARTMENT COURSE and DIFFERENTIAL DIAGNOSIS/MDM:   Vitals:    Vitals:    05/20/22 1032 05/20/22 1555   BP: (!) 198/124 (!) 189/101   Pulse: 79 77  Resp: 16 18   Temp: 98.8 F (37.1 C)    TempSrc: Oral    SpO2: 97% 98%   Weight: 209 lb 7 oz (95 kg)    Height: 5' (1.524 m)        Patient was given the following medications:  Medications   ketorolac (TORADOL) injection 15 mg (15 mg IntraVENous Given 05/20/22 1215)   iopamidol (ISOVUE-370) 76 % injection 75 mL (75 mLs IntraVENous Given 05/20/22 1315)             Patient was nontoxic, well appearing, with normal vital signs with exception of hypertension 198/94.  Has a history of hypertension.  She presents for left ear pain, swelling, drainage.  On exam she has mild edema of the  external left ear.  She also has significant edema of the left ear canal.  Has yellow drainage.  I cannot see TM.  She also has mild edema and tenderness over the left preauricular area.  There is minimal tenderness palpation over the mastoid but no erythema or edema.  Will obtain labs and imaging.  She was given IV Toradol.    Records reviewed:  -Reviewed note from urgent care visit earlier today -noted concern for mastoiditis so sent to ED.  Patient also noted to be hypertensive in office.  Patient forgets to take her hypertensive medication often    Differential diagnosis includes otitis externa, malignant otitis externa, mastoiditis, otitis media, sepsis, other    Labs show no leukocytosis or anemia.  Metabolic panel shows normal electrolytes, renal function, LFT.  Lactic acid normal.  Pregnancy negative.    CT IAC posterior fossa with contrast shows severe left otitis externa concomitant left otitis media.  There is involvement of the soft tissues surrounding the left ear.  No bony erosion or abscess is identified.    I consulted ENT and spoke with Dr. Rodman Pickle who evaluated patient in the emergency department.  He placed an ear wick and gave ciprofloxacin and dexamethasone drops.  Recommends discharging the patient with Levaquin and Ciprodex.  I prescribed Levaquin and instructed patient to take home the ciprofloxacin and dexamethasone drops used here and to use 4 drops of each twice daily.  Patient understood..  Reports his office will call the patient to set up an appointment for wick removal in 2 to 3 days.  Discussed with patient and discussed that if she does not hear from his office by tomorrow afternoon, to give them a call.  Return for worsening.  She understood.          Chronic Conditions:       I am the Primary Clinician of Record.    Is this patient to be included in the SEP-1 Core Measure due to severe sepsis or septic shock?   No   Exclusion criteria - the patient is NOT to be included for SEP-1 Core  Measure due to:  2+ SIRS criteria are not met    PROCEDURES   Unless otherwise noted below, none     Procedures    FINAL IMPRESSION      1. Acute otitis externa of left ear, unspecified type    2. Elevated blood pressure reading          DISPOSITION/PLAN       DISPOSITION Decision To Discharge 05/20/2022 03:45:53 PM      PATIENT REFERRED TO:  Lujean Amel, [place]  [place]  [place] [address]  [phone]  Call   for reevaluation    [facility]  [address]  [place] [address]  [phone]    As needed, If symptoms worsen    [facility]  [phone]  Schedule an appointment as soon as possible for a visit   for reevaluation of your blood pressure    Lujean Amel, [place]  [address]  [place] [address]  [facility]  [phone]          DISCHARGE MEDICATIONS:  Discharge Medication List as of 05/20/2022  3:51 PM        START taking these medications    Details   levoFLOXacin (LEVAQUIN) 750 MG tablet Take 1 tablet by mouth daily for 10 days, Disp-10 tablet, R-0Normal      naproxen (NAPROSYN) 500 MG tablet Take 1 tablet by mouth 2 times daily as needed for Pain, Disp-30 tablet, R-0Normal             DISCONTINUED MEDICATIONS:  Discharge Medication List as of 05/20/2022  3:51 PM                 (Please note that portions of this note were completed with a voice recognition program.  Efforts were made to edit the dictations but occasionally words are mis-transcribed.)    Marcellus Scott, PA (electronically signed)           Marcellus Scott, PA  05/20/22 [phone]

## 2022-05-20 NOTE — Progress Notes (Signed)
Katie Hopkins (DOB:  06/22/1996) is a 26 y.o. female, New patient, here for evaluation of the following chief complaint(s):  Otalgia (Pain since Saturday, lt ear down to throat.)    ASSESSMENT/PLAN:    ICD-10-CM    1. Mastoiditis, acute, left  H70.002       2. Hypertension, unspecified type  I10         25yo F presents for left ear pain. Physical exam as below. Concerns for mastoiditis based on physical exam. Discussed proper evaluation in the Emergency Department and patient agrees. She will travel by private vehicle to [facility] ED. Report given to charge nurse. Hypertensive, other VSS.   Elevated BP x2 in clinic. Pt has HTN and is currently on medication but she reports she forgets to take it often. Likely increase in BP d/t pain from ear. She is to continue seeing PCP.   Reviewed AVS with patient. All questions answered. If symptoms worsen go to the Emergency Department. Follow up in clinic or with PCP as needed.     SUBJECTIVE/OBJECTIVE:  25yo presents with left ear pain x3-4 days. Pulsating pain entire ear, swelling. Skin hurts. Denies fevers. Naproxen with mild relief. Hurts to chew.      History provided by:  Patient  Language interpreter used: No    Otalgia   Associated symptoms include ear discharge and a sore throat. Pertinent negatives include no headaches.     Vitals:    05/20/22 0932 05/20/22 0940   BP: (!) 150/99 (!) 164/116   Site: Right Upper Arm Left Upper Arm   Position: Sitting Sitting   Cuff Size: Large Adult Large Adult   Pulse: 80    Temp: 98.6 F (37 C)    TempSrc: Oral    SpO2: 99%    Weight: 214 lb (97.1 kg)        Review of Systems   Constitutional:  Negative for fever.   HENT:  Positive for ear discharge, ear pain, facial swelling and sore throat.    Neurological:  Negative for light-headedness and headaches.     Physical Exam  Vitals reviewed.   Constitutional:       General: She is not in acute distress.  HENT:      Right Ear: Tympanic membrane and ear canal normal.      Left Ear:  Drainage, swelling and tenderness present. There is mastoid tenderness.      Ears:      Comments: No increased warmth appreciated of left ear. Unable to assess EAC and TM d/t swelling/pain.     Mouth/Throat:      Lips: Pink.      Mouth: Mucous membranes are moist.   Pulmonary:      Effort: Pulmonary effort is normal. No respiratory distress.   Musculoskeletal:         General: Normal range of motion.      Cervical back: Normal range of motion. Tenderness present. No rigidity.   Lymphadenopathy:      Cervical: Cervical adenopathy (left side) present.   Skin:     General: Skin is warm and dry.   Neurological:      Mental Status: She is alert.   Psychiatric:         Behavior: Behavior normal.     An electronic signature was used to authenticate this note.    --Buren Kos, PA-C

## 2022-05-20 NOTE — Consults (Signed)
[facility]  DIVISION OF OTOLARYNGOLOGY- HEAD & NECK SURGERY  CONSULT      Patient Name: Katie Hopkins  Medical Record Number:  [id number]  Primary Care Physician:  No primary care provider on file.  Date of Consultation: 05/20/2022    Chief Complaint: Left ear infection        HISTORY OF PRESENT ILLNESS  Katie Hopkins is a(n) 26 y.o. female who presents for evaluation of a left ear infection.  Patient says about 3 days ago her left ear started to hurt.  It has gotten progressively worse since that time.  She severe pain and muffled hearing.  She says that this is never happened before.  She has never had significant ear infections.  She has never had swimmer's ear.  She is not diabetic.  She is not on any immune suppressing drugs.  The right ear is normal.            REVIEW OF SYSTEMS  As above    PHYSICAL EXAM  GENERAL: No Acute Distress, Alert and Oriented, no Hoarseness, strong voice  EYES: EOMI, Anti-icteric  HENT:   Head: Normocephalic and atraumatic.   Face:  Symmetric, facial nerve intact, no sinus tenderness  Right Ear: Normal external ear, normal external auditory canal, intact tympanic membrane with normal mobility and aerated middle ear  Left Ear: Significant pain pitting the left ear.  Edema of the left external auditory canal.  I cannot see the tympanic membrane.  I placed an otowick followed by Ciprodex drops.  Mouth/Oral Cavity:  normal lips, Uvula is midline, no mucosal lesions, no trismus,  Oropharynx/Larynx:  normal oropharynx  Nose:Normal external nasal appearance.    NECK: Normal range of motion, no thyromegaly, trachea is midline, no lymphadenopathy, no neck masses, no crepitus        RADIOLOGY  Summary of findings:  I reviewed the patient CT scan.  She has some cellulitic changes around the soft tissue of the left ear.  She has a very edematous ear canal.  She has a bit of fluid in the middle ear space as well.  Trace mastoid fluid.  No obvious bony erosion of the mastoid or intracranial  involvement.            ASSESSMENT/PLAN  Left otitis externa-this patient seems to have a very severe left otitis externa.  She does not have obvious risk factors such as diabetes or immune disorders.  I would recommend treating her with Ciprodex drops 4 drops twice a day to the left ear as well as Levaquin orally daily.  I will see her in my clinic in 2 to 3 days to take out the otowick and make sure that she is heading in the right direction.    I have performed a head and neck physical exam personally or was physically present during the key or critical portions of the service.    This note was generated completely or in part utilizing Dragon dictation speech recognition software.  Occasionally, words are mistranscribed and despite editing, the text may contain inaccuracies due to incorrect word recognition.  If further clarification is needed please contact the office at [phone].

## 2022-05-20 NOTE — Progress Notes (Deleted)
Katie Hopkins (DOB:  02-22-1996) is a 26 y.o. female, New patient, here for evaluation of the following chief complaint(s):  Otalgia (Pain since Saturday, lt ear down to throat.)    ASSESSMENT/PLAN:    ICD-10-CM    1. Hypertension, unspecified type  I10           No follow-ups on file.    REVIEW Vital Signs    Reviewed AVS with patient. All questions answered. If symptoms worsen go to the Emergency Department. Follow up in clinic or with PCP as needed.     SUBJECTIVE/OBJECTIVE:  HPI    Vitals:    05/20/22 0932 05/20/22 0940   BP: (!) 150/99 (!) 164/116   Site: Right Upper Arm Left Upper Arm   Position: Sitting Sitting   Cuff Size: Large Adult Large Adult   Pulse: 80    Temp: 98.6 F (37 C)    TempSrc: Oral    SpO2: 99%    Weight: 214 lb (97.1 kg)        Review of Systems    Physical Exam      An electronic signature was used to authenticate this note.    --Buren Kos, PA-C

## 2022-05-20 NOTE — Discharge Instructions (Addendum)
Put 4 drops of each ear drop in your left ear twice daily for 10 days

## 2022-05-22 ENCOUNTER — Ambulatory Visit: Admit: 2022-05-22 | Discharge: 2022-05-22 | Payer: Medicaid Other | Attending: Otolaryngology

## 2022-05-22 DIAGNOSIS — H60332 Swimmer's ear, left ear: Secondary | ICD-10-CM

## 2022-05-22 NOTE — Progress Notes (Signed)
[facility]  DIVISION OF OTOLARYNGOLOGY- HEAD & NECK SURGERY  Follow up      Patient Name: Katie Hopkins  Medical Record Number:  [id number]  Primary Care Physician:  No primary care provider on file.  Date of Consultation: 05/22/2022    Chief Complaint: Otitis externa        Interval History    Patient is following up for her otitis externa.  I saw her 2 days ago in the emergency room and placed an otowick.  She had fairly severe otitis externa.  She said that her hearing is still muffled, but the pain has improved.  She has been using the eardrops and has taken 1 dose of the antibiotic.  The right ear is still fine.          REVIEW OF SYSTEMS  As above    PHYSICAL EXAM  GENERAL: No Acute Distress, Alert and Oriented, no Hoarseness, strong voice  EYES: EOMI, Anti-icteric  HENT:   Head: Normocephalic and atraumatic.   Face:  Symmetric, facial nerve intact, no sinus tenderness  Ears: See below        PROCEDURE  Bilateral ear exam  The right ear was visualized with binocular scope.  Tympanic membrane was intact with an aerated middle ear.  On the left side the otowick was removed.  She still has significant edema of the ear canal, but slightly improved.  I debrided some purulence out of the canal.  I could not get a good look at the tympanic membrane.  I placed another otowick.          ASSESSMENT/PLAN  1. Acute swimmer's ear of left side  Clinically improving after 2 days of drops and 1 dose of antibiotic.  I placed another otowick today as I still cannot see her tympanic membrane and she has a lot of edema.  I would like to see her again on Monday to remove the otowick and make sure it is improving.             I have performed a head and neck physical exam personally or was physically present during the key or critical portions of the service.    This note was generated completely or in part utilizing Dragon dictation speech recognition software.  Occasionally, words are mistranscribed and despite editing, the  text may contain inaccuracies due to incorrect word recognition.  If further clarification is needed please contact the office at [phone].

## 2022-05-26 ENCOUNTER — Ambulatory Visit: Admit: 2022-05-26 | Discharge: 2022-05-26 | Payer: Medicaid Other | Attending: Otolaryngology

## 2022-05-26 DIAGNOSIS — H60332 Swimmer's ear, left ear: Secondary | ICD-10-CM

## 2022-05-26 NOTE — Progress Notes (Signed)
[facility]  DIVISION OF OTOLARYNGOLOGY- HEAD & NECK SURGERY  Follow up      Patient Name: Katie Hopkins  Medical Record Number:  [id number]  Primary Care Physician:  No primary care provider on file.  Date of Consultation: 05/26/2022    Chief Complaint: Left ear issues        Interval History  Patient is following up for her left otitis externa.  I saw her on July 13 and placed a new otowick.  She is using the Ciprodex as well as Levaquin.  She says things seem to be continue to have in the right direction.            REVIEW OF SYSTEMS  As above    PHYSICAL EXAM  GENERAL: No Acute Distress, Alert and Oriented, no Hoarseness, strong voice  EYES: EOMI, Anti-icteric  HENT:   Head: Normocephalic and atraumatic.   Face:  Symmetric, facial nerve intact, no sinus tenderness  Right Ear: Normal external ear, normal external auditory canal, intact tympanic membrane with normal mobility and aerated middle ear  Left Ear: Otowick was removed.  Improvement canal edema.  I debrided some more purulent drainage and applied boric acid.            ASSESSMENT/PLAN  1. Acute swimmer's ear of left side  This continues to improve, but she still has quite a bit of inflammation.  She says she has 5 days of Levaquin left.  I told her to call me to let me know whether or not she thinks she needs another round of antibiotics.  She had issues with cost for the original Levaquin.             I have performed a head and neck physical exam personally or was physically present during the key or critical portions of the service.    This note was generated completely or in part utilizing Dragon dictation speech recognition software.  Occasionally, words are mistranscribed and despite editing, the text may contain inaccuracies due to incorrect word recognition.  If further clarification is needed please contact the office at [phone].

## 2022-05-30 ENCOUNTER — Ambulatory Visit: Payer: Medicaid Other | Admitting: Interventional Cardiology

## 2022-07-07 ENCOUNTER — Ambulatory Visit: Payer: Medicaid Other | Admitting: Interventional Cardiology

## 2022-07-07 ENCOUNTER — Ambulatory Visit: Payer: Medicaid Other | Admitting: Advanced Practice Midwife

## 2022-07-07 NOTE — Progress Notes (Deleted)
Cardiology Office Note:    Date:  07/07/2022   ID:  Deanna Valencia, DOB [birth year], MRN [id number]  PCP:  Dorna Mai, MD  Cardiologist:  Sinclair Grooms, MD   Referring MD: Dorna Mai, MD   No chief complaint on file.   History of Present Illness:    Deanna Valencia is a 26 y.o. female with a hx of hypertensive  urgency. Currently take Dyazide and Amlodipine, and returning today for follow-up blood pressure.   ***  Past Medical History:  Diagnosis Date   Anxiety    Depression    Eating disorder     No past surgical history on file.  Current Medications: No outpatient medications have been marked as taking for the 07/07/22 encounter (Appointment) with Belva Crome, MD.     Allergies:   Amoxicillin and Tomato   Social History   Socioeconomic History   Marital status: Single    Spouse name: Not on file   Number of children: Not on file   Years of education: Not on file   Highest education level: Not on file  Occupational History   Not on file  Tobacco Use   Smoking status: Every Day    Packs/day: 0.25    Years: 1.00    Total pack years: 0.25    Types: Cigarettes   Smokeless tobacco: Never  Substance and Sexual Activity   Alcohol use: Yes    Alcohol/week: 0.0 standard drinks of alcohol    Comment: occasionally..07/22/16 quit a year ago... 12-16-16  wine    Drug use: Yes    Types: Marijuana    Comment: last smoked right before preg confirmed.   Sexual activity: Yes    Partners: Female    Birth control/protection: None    Comment: use to prefer females  Other Topics Concern   Not on file  Social History Narrative   Not on file   Social Determinants of Health   Financial Resource Strain: Not on file  Food Insecurity: Not on file  Transportation Needs: Not on file  Physical Activity: Not on file  Stress: Not on file  Social Connections: Not on file     Family History: The patient's family history includes Diabetes in her paternal  grandfather; Hearing loss in her paternal grandfather; Hypertension in her father and paternal grandfather.  ROS:   Please see the history of present illness.    *** All other systems reviewed and are negative.  EKGs/Labs/Other Studies Reviewed:    The following studies were reviewed today: ***  EKG:  EKG ***  Recent Labs: 03/12/2022: BUN 7; Creatinine, Ser 0.73; Potassium 3.9; Sodium 139; TSH 0.846  Recent Lipid Panel    Component Value Date/Time   CHOL 173 02/28/2015 1945   TRIG 89 02/28/2015 1945   HDL 65 02/28/2015 1945   CHOLHDL 2.7 02/28/2015 1945   VLDL 18 02/28/2015 1945   LDLCALC 90 02/28/2015 1945    Physical Exam:    VS:  There were no vitals taken for this visit.    Wt Readings from Last 3 Encounters:  03/12/22 208 lb 6.4 oz (94.5 kg)  03/11/22 210 lb (95.3 kg)  02/13/21 193 lb (87.5 kg)     GEN: ***. No acute distress HEENT: Normal NECK: No JVD. LYMPHATICS: No lymphadenopathy CARDIAC: *** murmur. RRR *** gallop, or edema. VASCULAR: *** Normal Pulses. No bruits. RESPIRATORY:  Clear to auscultation without rales, wheezing or rhonchi  ABDOMEN: Soft, non-tender, non-distended, No pulsatile  mass, MUSCULOSKELETAL: No deformity  SKIN: Warm and dry NEUROLOGIC:  Alert and oriented x 3 PSYCHIATRIC:  Normal affect   ASSESSMENT:    1. Hypertension, unspecified type   2. Generalized anxiety disorder   3. Pre-eclampsia, antepartum    PLAN:    In order of problems listed above:  ***   Medication Adjustments/Labs and Tests Ordered: Current medicines are reviewed at length with the patient today.  Concerns regarding medicines are outlined above.  No orders of the defined types were placed in this encounter.  No orders of the defined types were placed in this encounter.   There are no Patient Instructions on file for this visit.   Signed, Sinclair Grooms, MD  07/07/2022 9:15 AM    [place]

## 2022-10-09 ENCOUNTER — Ambulatory Visit: Admit: 2022-10-09 | Discharge: 2022-10-09 | Payer: PRIVATE HEALTH INSURANCE

## 2022-10-09 ENCOUNTER — Emergency Department: Admit: 2022-10-09 | Payer: PRIVATE HEALTH INSURANCE

## 2022-10-09 ENCOUNTER — Inpatient Hospital Stay
Admit: 2022-10-09 | Discharge: 2022-10-09 | Disposition: A | Discharge status: Home / Self Care | Payer: PRIVATE HEALTH INSURANCE | Attending: Emergency Medicine

## 2022-10-09 ENCOUNTER — Emergency Department: Admit: 2022-10-09 | Payer: Medicaid Other

## 2022-10-09 DIAGNOSIS — J019 Acute sinusitis, unspecified: Secondary | ICD-10-CM

## 2022-10-09 DIAGNOSIS — I10 Essential (primary) hypertension: Secondary | ICD-10-CM

## 2022-10-09 LAB — URINALYSIS WITH REFLEX TO CULTURE
Bilirubin Urine: NEGATIVE
Glucose, Ur: NEGATIVE mg/dL
Ketones, Urine: NEGATIVE mg/dL
Leukocyte Esterase, Urine: NEGATIVE
Nitrite, Urine: NEGATIVE
Protein, UA: 30 mg/dL — AB
Specific Gravity, UA: 1.024 (ref 1.005–1.030)
Urobilinogen, Urine: 0.2 E.U./dL (ref <2.0)
pH, UA: 5 (ref 5.0–8.0)

## 2022-10-09 LAB — COMPREHENSIVE METABOLIC PANEL W/ REFLEX TO MG FOR LOW K
ALT: 12 U/L (ref 10–40)
AST: 15 U/L (ref 15–37)
Albumin/Globulin Ratio: 1.1 (ref 1.1–2.2)
Albumin: 4.1 g/dL (ref 3.4–5.0)
Alkaline Phosphatase: 80 U/L (ref 40–129)
Anion Gap: 9 (ref 3–16)
BUN: 11 mg/dL (ref 7–20)
CO2: 28 mmol/L (ref 21–32)
Calcium: 9.4 mg/dL (ref 8.3–10.6)
Chloride: 101 mmol/L (ref 99–110)
Creatinine: 0.8 mg/dL (ref 0.6–1.1)
Est, Glom Filt Rate: >60 (ref >60)
Glucose: 86 mg/dL (ref 70–99)
Potassium reflex Magnesium: 4 mmol/L (ref 3.5–5.1)
Sodium: 138 mmol/L (ref 136–145)
Total Bilirubin: 0.4 mg/dL (ref 0.0–1.0)
Total Protein: 7.7 g/dL (ref 6.4–8.2)

## 2022-10-09 LAB — CBC WITH AUTO DIFFERENTIAL
Basophils %: 0.5 %
Basophils Absolute: 0 10*3/uL (ref 0.0–0.2)
Eosinophils %: 4.7 %
Eosinophils Absolute: 0.3 10*3/uL (ref 0.0–0.6)
Hematocrit: 38.3 % (ref 36.0–48.0)
Hemoglobin: 13.1 g/dL (ref 12.0–16.0)
Lymphocytes %: 27 %
Lymphocytes Absolute: 1.7 10*3/uL (ref 1.0–5.1)
MCH: 31 pg (ref 26.0–34.0)
MCHC: 34.3 g/dL (ref 31.0–36.0)
MCV: 90.3 fL (ref 80.0–100.0)
MPV: 9.9 fL (ref 5.0–10.5)
Monocytes %: 10.8 %
Monocytes Absolute: 0.7 10*3/uL (ref 0.0–1.3)
Neutrophils %: 57 %
Neutrophils Absolute: 3.6 10*3/uL (ref 1.7–7.7)
Platelets: 253 10*3/uL (ref 135–450)
RBC: 4.24 M/uL (ref 4.00–5.20)
RDW: 12.9 % (ref 12.4–15.4)
WBC: 6.3 10*3/uL (ref 4.0–11.0)

## 2022-10-09 LAB — RAPID INFLUENZA A/B ANTIGENS
Rapid Influenza A Ag: NEGATIVE
Rapid Influenza B Ag: NEGATIVE

## 2022-10-09 LAB — MICROSCOPIC URINALYSIS
Epithelial Cells, UA: 6 /HPF — ABNORMAL HIGH (ref 0–5)
Hyaline Casts, UA: 0 /LPF (ref 0–8)
RBC, UA: 13 /HPF — ABNORMAL HIGH (ref 0–4)
WBC, UA: 4 /HPF (ref 0–5)

## 2022-10-09 LAB — PREGNANCY, URINE: HCG(Urine) Pregnancy Test: NEGATIVE

## 2022-10-09 LAB — TROPONIN: Troponin, High Sensitivity: <6 ng/L (ref 0–14)

## 2022-10-09 LAB — BRAIN NATRIURETIC PEPTIDE: Pro-BNP: 380 pg/mL — ABNORMAL HIGH (ref 0–124)

## 2022-10-09 LAB — COVID-19, RAPID: SARS-CoV-2, NAAT: NOT DETECTED

## 2022-10-09 MED ORDER — AMLODIPINE BESYLATE 5 MG PO TABS
5 MG | ORAL_TABLET | Freq: Every day | ORAL | 0 refills | Status: DC
Start: 2022-10-09 — End: 2022-10-16

## 2022-10-09 MED ORDER — SALINE SPRAY 0.65 % NA SOLN
0.65 % | NASAL | Status: DC | PRN
Start: 2022-10-09 — End: 2022-10-09

## 2022-10-09 MED ORDER — AMLODIPINE BESYLATE 5 MG PO TABS
5 MG | Freq: Once | ORAL | Status: AC
Start: 2022-10-09 — End: 2022-10-09
  Administered 2022-10-09: 21:00:00 5 mg via ORAL

## 2022-10-09 MED ORDER — ONDANSETRON HCL 4 MG/2ML IJ SOLN
4 MG/2ML | INTRAMUSCULAR | Status: DC | PRN
Start: 2022-10-09 — End: 2022-10-09
  Administered 2022-10-09: 17:00:00 4 mg via INTRAVENOUS

## 2022-10-09 MED ORDER — CEFUROXIME AXETIL 250 MG PO TABS
250 MG | ORAL_TABLET | Freq: Two times a day (BID) | ORAL | 0 refills | Status: DC
Start: 2022-10-09 — End: 2022-10-16

## 2022-10-09 MED ORDER — IOPAMIDOL 76 % IV SOLN
76 % | Freq: Once | INTRAVENOUS | Status: AC | PRN
Start: 2022-10-09 — End: 2022-10-09
  Administered 2022-10-09: 19:00:00 75 mL via INTRAVENOUS

## 2022-10-09 MED ORDER — ACETAMINOPHEN 500 MG PO TABS
500 MG | Freq: Once | ORAL | Status: AC
Start: 2022-10-09 — End: 2022-10-09
  Administered 2022-10-09: 17:00:00 1000 mg via ORAL

## 2022-10-09 MED FILL — AMLODIPINE BESYLATE 5 MG PO TABS: 5 MG | ORAL | Qty: 1

## 2022-10-09 MED FILL — ONDANSETRON HCL 4 MG/2ML IJ SOLN: 4 MG/2ML | INTRAMUSCULAR | Qty: 2

## 2022-10-09 MED FILL — ACETAMINOPHEN EXTRA STRENGTH 500 MG PO TABS: 500 MG | ORAL | Qty: 2

## 2022-10-09 MED FILL — DEEP SEA NASAL SPRAY 0.65 % NA SOLN: 0.65 % | NASAL | Qty: 44

## 2022-10-09 NOTE — ED Notes (Signed)
Pt to room 9 at this time from triage.      Dena Billet, RN  10/09/22 1157

## 2022-10-09 NOTE — ED Notes (Signed)
Patient wheeled to CT.     Jake Samples, RN  10/09/22 [phone]

## 2022-10-09 NOTE — Discharge Instructions (Addendum)
Please follow the family doctor as we discussed return to ED if any worsening symptoms as we discussed

## 2022-10-09 NOTE — ED Provider Notes (Signed)
[place]        Pt Name: Katie Hopkins  MRN: [id number]  [place] 06-Mar-1996  Date of evaluation: 10/09/2022  Provider: Hassan Rowan, APRN - CNP  PCP: No primary care provider on file.  Note Started: 12:15 PM EST 10/09/22       I have seen and evaluated this patient with my supervising physician Mayford Knife, MD.      CHIEF COMPLAINT       Chief Complaint   Patient presents with    Hypertension     Urgent care sent her over here d/t having elevated BP and does not take BP medications but informed this RN that her eye doctor told her she had high pressure in her eyes; pressure is on her left side in her head and neck and behind the eye; denies blurred vision    Nasal Congestion     Complaints been having nasal congestion since Sunday/Monday and spitting up drainage today 0600 she noticed it being red tinged so she when to urgent care; no other symptoms ; states she just drove back to [place] from [place]       HISTORY OF PRESENT ILLNESS: 1 or more Elements     History From: pt        Social Determinants Significantly Affecting Health : Patient has significant healthcare illiteracy    Chief Complaint:above    Katie Hopkins is a 26 y.o. female who  has a past medical history of Hypertension. presents to ED for concern with elevated blood pressure.  Went to urgent care earlier today for nasal congestion which is been going on for "a while".  Recent travel to [place]  Does not take any of her antihypertensives since she was last pregnant which was a a while back.  Her main concern is nasal congestion.  She went to urgent care and they asked her to come to ED with a blood pressure.  She has had chest pressure and blurry vision for "a while".  She tells me she does not take her blood pressure medicine because she is afraid of overdosing.  She has overdosed in the past.  Not currently suicidal.  She is stressed about her living situation, her husband  is a heavy smoker and drinks every day.  As such she believes she does the same.  She is also stressed about her work.  She has not seen a PCP in at least 7 months.  She would like to try natural remedies for blood pressure control list of medications.    The patient  reports that she has been smoking cigarettes. She has never used smokeless tobacco. She reports current alcohol use. She reports current drug use. Drug: Marijuana Sherrie Mustache).       Nursing Notes were all reviewed and agreed with or any disagreements were addressed in the HPI.    REVIEW OF SYSTEMS :      Review of Systems    Positives and Pertinent negatives as per HPI.     SURGICAL HISTORY   History reviewed. No pertinent surgical history.    CURRENTMEDICATIONS       Previous Medications    LOSARTAN-HYDROCHLOROTHIAZIDE (HYZAAR) 50-12.5 MG PER TABLET    Take 1 tablet by mouth daily    NAPROXEN (NAPROSYN) 500 MG TABLET    Take 1 tablet by mouth 2 times daily as needed for Pain       ALLERGIES  Amoxicillin    FAMILYHISTORY     History reviewed. No pertinent family history.     SOCIAL HISTORY       Social History     Tobacco Use    Smoking status: Every Day     Types: Cigarettes    Smokeless tobacco: Never   Vaping Use    Vaping Use: Never used   Substance Use Topics    Alcohol use: Yes    Drug use: Yes     Types: Marijuana (Weed)       SCREENINGS        Glasgow Coma Scale  Eye Opening: Spontaneous  Best Verbal Response: Oriented  Best Motor Response: Obeys commands  Glasgow Coma Scale Score: 15                CIWA Assessment  BP: (!) 208/118  Pulse: 65           PHYSICAL EXAM  1 or more Elements     ED Triage Vitals [10/09/22 1032]   BP Temp Temp src Pulse Respirations SpO2 Height Weight - Scale   (!) 208/133 99 F (37.2 C) -- 88 17 96 % 1.524 m (5') 97.8 kg (215 lb 9.8 oz)       Physical Exam  Vitals and nursing note reviewed.   Constitutional:       Appearance: Normal appearance. She is obese. She is not ill-appearing, toxic-appearing or diaphoretic.    HENT:      Head: Normocephalic and atraumatic.      Right Ear: External ear normal.      Left Ear: External ear normal.      Nose: Nose normal. No congestion or rhinorrhea.      Mouth/Throat:      Mouth: Mucous membranes are moist.      Pharynx: Oropharynx is clear. No oropharyngeal exudate or posterior oropharyngeal erythema.   Eyes:      General: No scleral icterus.        Right eye: No discharge.         Left eye: No discharge.      Extraocular Movements: Extraocular movements intact.      Conjunctiva/sclera: Conjunctivae normal.      Pupils: Pupils are equal, round, and reactive to light.   Cardiovascular:      Rate and Rhythm: Normal rate and regular rhythm.      Pulses: Normal pulses.      Heart sounds: Normal heart sounds. No murmur heard.     No friction rub. No gallop.   Pulmonary:      Effort: Pulmonary effort is normal. No respiratory distress.      Breath sounds: Normal breath sounds. No stridor. No wheezing, rhonchi or rales.   Abdominal:      General: Abdomen is flat. Bowel sounds are normal. There is no distension.      Palpations: Abdomen is soft.      Tenderness: There is no abdominal tenderness. There is no right CVA tenderness, left CVA tenderness or guarding.   Musculoskeletal:         General: Normal range of motion.      Cervical back: Normal range of motion and neck supple. No rigidity or tenderness.   Skin:     General: Skin is warm and dry.      Capillary Refill: Capillary refill takes less than 2 seconds.      Coloration: Skin is not jaundiced or pale.  Findings: No rash.   Neurological:      General: No focal deficit present.      Mental Status: She is alert and oriented to person, place, and time. Mental status is at baseline.   Psychiatric:         Mood and Affect: Mood normal.         Behavior: Behavior normal.           DIAGNOSTIC RESULTS   LABS:    Labs Reviewed   BRAIN NATRIURETIC PEPTIDE - Abnormal; Notable for the following components:       Result Value    Pro-BNP 380 (*)      All other components within normal limits   URINALYSIS WITH REFLEX TO CULTURE - Abnormal; Notable for the following components:    Clarity, UA CLOUDY (*)     Blood, Urine LARGE (*)     Protein, UA 30 (*)     All other components within normal limits   MICROSCOPIC URINALYSIS - Abnormal; Notable for the following components:    Bacteria, UA 1+ (*)     RBC, UA 13 (*)     Epithelial Cells, UA 6 (*)     All other components within normal limits   RAPID INFLUENZA A/B ANTIGENS   COVID-19, RAPID   COMPREHENSIVE METABOLIC PANEL W/ REFLEX TO MG FOR LOW K   CBC WITH AUTO DIFFERENTIAL   TROPONIN   PREGNANCY, URINE       When ordered only abnormal lab results are displayed. All other labs were within normal range or not returned as of this dictation.    EKG: When ordered, EKG's are interpreted by the Emergency Department Physician in the absence of a cardiologist.  Please see their note for interpretation of EKG.    RADIOLOGY:   Non-plain film images such as CT, Ultrasound and MRI are read by the radiologist. Plain radiographic images are visualized and preliminarily interpreted by the ED Provider with the below findings:      Agree with cxr below no acute abn.     Interpretation per the Radiologist below, if available at the time of this note:    CT HEAD WO CONTRAST   Final Result   No acute intracranial abnormality.      Moderate paranasal sinus inflammatory changes with complete opacification of   the left maxillary sinus.         CT CHEST PULMONARY EMBOLISM W CONTRAST   Final Result   No evidence of pulmonary embolism or acute pulmonary abnormality.         XR CHEST (2 VW)   Final Result   No abnormalities demonstrated.           XR CHEST (2 VW)    Result Date: 10/09/2022  EXAMINATION: TWO XRAY VIEWS OF THE CHEST 10/09/2022 11:10 am COMPARISON: None. HISTORY: ORDERING SYSTEM PROVIDED HISTORY: htn urgency TECHNOLOGIST PROVIDED HISTORY: Reason for exam:->htn urgency Reason for Exam: htn urgency FINDINGS: The lungs appear  clear. The heart and mediastinal structures are unremarkable. Bony thorax appears normal. Visualized upper abdomen is unremarkable.     No abnormalities demonstrated.        No results found.    PROCEDURES   Unless otherwise noted below, none     Procedures    CRITICAL CARE TIME (.cctime)   I personally saw the patient and independently provided 15 minutes of nonconcurrent critical care out of the total shared critical care time provided  This includes multiple reevaluations, vital sign monitoring, pulse oximetry monitoring, telemetry monitoring, clinical response to the IV medications, reviewing the nursing notes, consultation time, dictation/documentation time, and interpretation of the labwork. (This time excludes time spent performing procedures).      PAST MEDICAL HISTORY      has a past medical history of Hypertension.     EMERGENCY DEPARTMENT COURSE and DIFFERENTIAL DIAGNOSIS/MDM:   Vitals:    Vitals:    10/09/22 1510 10/09/22 1525 10/09/22 1540 10/09/22 1555   BP:    (!) 208/118   Pulse: 58 64 68 65   Resp: _0 Temp:       SpO2: 100% 100% 100% 96%   Weight:       Height:           Patient was given the following medications:  Medications   ondansetron (ZOFRAN) injection 4 mg (4 mg IntraVENous Given 10/09/22 1225)   sodium chloride (OCEAN, BABY AYR) 0.65 % nasal spray 1 spray (has no administration in time range)   acetaminophen (TYLENOL) tablet 1,000 mg (1,000 mg Oral Given 10/09/22 1224)   iopamidol (ISOVUE-370) 76 % injection 75 mL (75 mLs IntraVENous Given 10/09/22 1334)   amLODIPine (NORVASC) tablet 5 mg (5 mg Oral Given 10/09/22 1618)       ED Course as of 10/09/22 1711   Thu Oct 09, 2022   1615 Pt was offered admit fo rher htn. R/b of admit d/w pt by dr Kellie Simmering. Pt prefers outpatient management. Will start on anti htn regimen and urgent pcp referral. Shared decision to dc [KM]      ED Course User Index  [KM] Hassan Rowan, APRN - CNP        Is this patient to be included in the SEP-1 Core  Measure due to severe sepsis or septic shock?   No   Exclusion criteria - the patient is NOT to be included for SEP-1 Core Measure due to:  2+ SIRS criteria are not met        Chronic Conditions affecting care:    has a past medical history of Hypertension.    CONSULTS: (Who and What was discussed)  None      Records Reviewed (External and Source)     CC/HPI Summary, DDx, ED Course, and Reassessment:     This is a 26 year old with history of hypertension, nonadherence to medication presenting to ED with concern for nasal congestion found to be significantly hypertensive at urgent care.  IV access obtained.  Labs are sent.  Given her recent travel and hemoptysis a PE study was obtained.  Given her headaches with hypertension a CT scan was ordered to rule out a bleed.  Labs:  CMP without evidence of endorgan damage.  CBC unremarkable.  Troponin negative.  Urinalysis without acute infection.  Rapid COVID and flu negative.  CT head as above with sinusitis.  CT PE study without acute embolus.  Chest x-ray and PE study both without evidence of heart failure.    Patient was given a dose of headache medicine with good improvement.  Her blood pressure actually trended down to the 170s.  She was also started on a dose of amlodipine in the ED.  She will be discharged.  She prefers discharge.  We considered admission for hypertensive urgency however after risks and benefits were discussed with the patient she would strongly prefer to go home.  See conversation by my attending, Dr. Kellie Simmering.  Findings and plan discussed with patient.  She is agreeable to discharge.  Will be discharged on antibiotics for sinusitis, will start calcium channel blocker for blood pressure control and provide urgent referral to PCP.  She was counseled on alcohol cessation.    We discussed  appropriate Smoking/Vaping/Marijuana cessation for > 3 minutes    Advised patient to quit and offered support.  Educational material provided to patient.    Close  return precautions discussed    I discussed with patient the results of evaluation in the ED, diagnosis, care, and prognosis.  The plan is to discharge to home.  Patient is in agreement with plan and questions have been answered.      I also discussed with patient the reasons which may require a return visit and the importance of follow-up care.  The patient is well-appearing, nontoxic, and improved at the time of discharge.  Patient agrees to call to arrange follow-up care as directed.   Patient understands to return immediately for worsening/change in symptoms.     Referral for psych for her concerns with anxiety/depression     Disposition Considerations (tests considered but not done, Admit vs D/C, Shared Decision Making, Pt Expectation of Test or Tx.): above     The patient tolerated their visit well.  The patient and / or the family were informed of the results of any tests, a time was given to answer questions, a plan was proposed and they agreed with plan.    I am the Primary Clinician of Record.  FINAL IMPRESSION      1. Acute sinusitis, recurrence not specified, unspecified location    2. Essential hypertension    3. Smoker    4. Anxiety state    5. Elevated brain natriuretic peptide (BNP) level    6. Hemoptysis          DISPOSITION/PLAN     DISPOSITION Decision To Discharge 10/09/2022 03:52:39 PM      PATIENT REFERRED TO:  [facility]  [phone]        Rana Snare, APRN - CNP  [address]  [place] [address]  [phone]            DISCHARGE MEDICATIONS:  New Prescriptions    AMLODIPINE (NORVASC) 5 MG TABLET    Take 1 tablet by mouth daily    CEFUROXIME (CEFTIN) 250 MG TABLET    Take 1 tablet by mouth 2 times daily for 7 days       DISCONTINUED MEDICATIONS:  Discontinued Medications    No medications on file              (Please note that portions of this note were completed with a voice recognition program.  Efforts were made to edit the dictations but occasionally  words are mis-transcribed.)    Hassan Rowan, APRN - CNP (electronically signed)        Hassan Rowan, APRN - CNP  10/09/22 1711

## 2022-10-09 NOTE — ED Notes (Signed)
Patient states she has a history of high blood pressure but does not take her pills. States she does not like taking pills. Patient is in a not so good marriage that is stressful. Will started taking her blood pressure medication when she knows it will work, when she leaves her marriage. Patient alert & oriented. Denies any immediate needs. No signs of distress noted.     Jake Samples, RN  10/09/22 1236

## 2022-10-09 NOTE — ED Notes (Signed)
Patient and her mother (on the phone) questioning why the patient is being discharged with a blood pressure that high.     Jake Samples, RN  10/09/22 1734

## 2022-10-09 NOTE — Progress Notes (Signed)
Katie Hopkins (DOB:  03-19-1996) is a 26 y.o. female,Established patient, here for evaluation of the following chief complaint(s):  Headache (Pt c/o headache, eye throbbing on left side,chest pain since yesterday, and spitting up bloody mucous this morning)      ASSESSMENT/PLAN:    ICD-10-CM    1. Hypertension, unspecified type  I10         Patient is experiencing extremely elevated blood pressure in the clinic today. She is symptomatic with chest pressure, headache, dizziness, and pain behind her left eye. She was diagnosed with preeclampsia during her pregnancy a few years ago and has suffered from HTN since. She does not take her prescribed anti-hypertensive medication due to previously attempting to overdose on pills and therefore, pills bothering her. Due to her symptoms and blood pressure, she was sent to the emergency room immediately. We discussed the importance of maintaining a healthy blood pressure and the possibility of long-lasting cardiac, neuro, renal, etc., effects due to such a high blood pressure consistently. No one-sided weakness or vision changes. Declined squad transport and states that she will go immediately. Report was called to Avon Gully at [facility].     Dx Disposition: hypertensive emergency, CVA, TIA, MI  Education and handout provided on diagnosis and management of symptoms.   AVS reviewed with patient. Follow up as needed in UC or with PCP for new or worsening symptoms.   Return if symptoms worsen or fail to improve.    SUBJECTIVE/OBJECTIVE:  Patient presents to the clinic with complaints of left-sided head and eye pain, chest pressure, and bloody mucus. Denies any cough or sick symptoms. This started yesterday, but has worsened. Denies any fever or body aches. She hasn't tried any medications. She states that she is supposed to take blood pressure medication, but she doesn't because she doesn't like pills due to attempted suicide overdose in the past. She does not check her BP  at home.        History provided by:  Patient  Language interpreter used: No    Headache      Vitals:    10/09/22 0926 10/09/22 0942   BP: (!) 191/132 (!) 200/110   Pulse: 72    Resp: 16    Temp: 98.1 F (36.7 C)    SpO2: 96%    Weight: 90.7 kg (200 lb)    Height: 1.524 m (5')        Review of Systems   Constitutional:  Negative for chills, fatigue and fever.   HENT:  Positive for congestion and rhinorrhea. Negative for ear pain, sinus pressure, sinus pain and sore throat.    Eyes:  Positive for pain (left). Negative for discharge, itching and visual disturbance.   Respiratory:  Negative for cough and shortness of breath.    Cardiovascular:  Positive for chest pain ("something is laying on my whole chest").   Gastrointestinal:  Negative for diarrhea, nausea and vomiting.   Musculoskeletal:  Negative for myalgias.   Neurological:  Positive for weakness, light-headedness and headaches (left). Negative for dizziness.       Physical Exam  Constitutional:       General: She is not in acute distress.     Appearance: Normal appearance. She is not ill-appearing.   HENT:      Right Ear: Tympanic membrane normal.      Left Ear: Tympanic membrane normal.      Nose: Nose normal.      Mouth/Throat:  Mouth: Mucous membranes are moist.      Pharynx: No oropharyngeal exudate or posterior oropharyngeal erythema.   Cardiovascular:      Rate and Rhythm: Normal rate and regular rhythm.      Pulses: Normal pulses.      Heart sounds: Normal heart sounds.   Pulmonary:      Effort: Pulmonary effort is normal. No respiratory distress.      Breath sounds: Normal breath sounds. No wheezing, rhonchi or rales.   Skin:     General: Skin is warm and dry.   Neurological:      General: No focal deficit present.      Mental Status: She is alert and oriented to person, place, and time.      Motor: No weakness.      Gait: Gait normal.   Psychiatric:         Mood and Affect: Affect is tearful.           An electronic signature was used to  authenticate this note.    --Yehuda Budd, APRN - NP

## 2022-10-09 NOTE — ED Notes (Signed)
Discharge and education instructions reviewed. Patient verbalized understanding, teach-back successful. Patient denied questions at this time. No acute distress noted. Patient instructed to follow-up as noted - return to emergency department if symptoms worsen. Patient verbalized understanding. Discharged per EDMD with discharge instructions.       Jake Samples, RN  10/09/22 1734

## 2022-10-09 NOTE — ED Notes (Signed)
Patient's wife arrived at the bedside demanding that I tell her what's going on with her wife. I explained that her wife is alert & oriented and the patient could give her the information if she wants to share it and explained HIPAA.     Jake Samples, RN  10/09/22 1721

## 2022-10-09 NOTE — ED Notes (Signed)
Patient resting on the stretcher. Call light in reach. No signs of distress noted.      Jake Samples, RN  10/09/22 [phone]

## 2022-10-10 LAB — EKG 12-LEAD
Atrial Rate: 71 {beats}/min
P Axis: 17 degrees
P-R Interval: 154 ms
Q-T Interval: 368 ms
QRS Duration: 76 ms
QTc Calculation (Bazett): 399 ms
R Axis: 19 degrees
T Axis: -4 degrees
Ventricular Rate: 71 {beats}/min

## 2022-10-16 ENCOUNTER — Encounter

## 2022-10-16 ENCOUNTER — Encounter: Admit: 2022-10-16 | Discharge: 2022-10-16 | Payer: PRIVATE HEALTH INSURANCE | Attending: Nurse Practitioner

## 2022-10-16 DIAGNOSIS — I10 Essential (primary) hypertension: Secondary | ICD-10-CM

## 2022-10-16 LAB — BASIC METABOLIC PANEL
Anion Gap: 9 (ref 3–16)
BUN: 11 mg/dL (ref 7–20)
CO2: 33 mmol/L — ABNORMAL HIGH (ref 21–32)
Calcium: 9.9 mg/dL (ref 8.3–10.6)
Chloride: 97 mmol/L — ABNORMAL LOW (ref 99–110)
Creatinine: 0.7 mg/dL (ref 0.6–1.1)
Est, Glom Filt Rate: >60 (ref >60)
Glucose: 71 mg/dL (ref 70–99)
Potassium: 4.2 mmol/L (ref 3.5–5.1)
Sodium: 139 mmol/L (ref 136–145)

## 2022-10-16 MED ORDER — AMLODIPINE BESYLATE 5 MG PO TABS
5 MG | ORAL_TABLET | Freq: Every day | ORAL | 1 refills | Status: DC
Start: 2022-10-16 — End: 2023-06-25

## 2022-10-16 MED ORDER — LOSARTAN POTASSIUM-HCTZ 50-12.5 MG PO TABS
ORAL_TABLET | Freq: Every day | ORAL | 1 refills | Status: AC
Start: 2022-10-16 — End: 2023-06-29

## 2022-10-16 NOTE — Progress Notes (Signed)
SUBJECTIVE/OBJECTIVE:  Katie Hopkins (DOB:  02/03/1996) is a 26 y.o. female here for   Chief Complaint: Follow-Up from Hospital (Pt is here today for a hospital follow up and to establish care. )    PMH:      HTN--Patient has hx of HTN.  Not taking her meds until a week ago.  Has been prescribed amlodipine and Hyzaar.  Patient has not been taking these with any consistency.  Was recently in the ER for intermittent headaches and BP found to be greatly elevated.  Has since started taking these medications again and BP is very well-controlled and headaches have improved.  Does note that her mouth is dry.  Otherwise no side effects from medication.  Eye doctor in [place] told her she has hypertensive retinopathy. The patient denies any chest pain, chest tightness, palpitations, lower extremity edema, shortness of breath, headaches, dizziness, or blurred vision.      Ovarian Cysts-- Hx of these.  Was supposed to have surgery before she moved here, but could not due to elevated BP.    Pertinent Fam hx:    Dad-diabetes, hyperlipidemia, hypertension.  Maternal grandfather-prostate cancer.  Paternal grandfather-MI.    LMP:  Are regular.  10/09/22.    Last pap smear: Likely within the last year.     Last Mammogram: N/A       Social:   Alcohol: Endorses social alcohol consumption.  Smoking: Does have 3-year history of smoking cigarettes.  Smokes a few cigarettes per day amounting to approximately 2 packs/week.  No nicotine vaping  Drugs: Endorses daily marijuana use.    Occupation: Social work Engineer, production.  Working for [employer].  Family Life: Recently moved here from [place].  Lives with her wife.        HPI    Patient is a 26 year old female who is here today to establish care.  Patient was recently seen in the ER with hypertensive urgency.  Patient's troponin and EKG were within normal limits.  BNP with mild elevation.  Metabolic panel normal.  Chest CT for PE negative.  Brain imaging negative.  Patient  restarted her blood pressure medications and has been improving.      Review of Systems   Constitutional:  Negative for chills and fever.   Eyes:  Negative for visual disturbance.   Respiratory:  Negative for cough, chest tightness, shortness of breath and wheezing.    Cardiovascular:  Negative for chest pain, palpitations and leg swelling.   Neurological:  Negative for dizziness, syncope, speech difficulty, light-headedness and headaches.        Vitals:    10/16/22 0932   BP: 102/80   Pulse: 87   Resp: 13   Temp: 97.2 F (36.2 C)   SpO2: 99%        Physical Exam  Constitutional:       General: She is not in acute distress.     Appearance: Normal appearance. She is not ill-appearing.   HENT:      Head: Normocephalic and atraumatic.   Eyes:      Extraocular Movements: Extraocular movements intact.   Neck:      Thyroid: No thyroid mass.   Cardiovascular:      Rate and Rhythm: Normal rate and regular rhythm.      Pulses: Normal pulses.      Heart sounds: No murmur heard.     No friction rub. No gallop.   Pulmonary:      Effort: Pulmonary  effort is normal.      Breath sounds: Normal breath sounds. No wheezing, rhonchi or rales.   Musculoskeletal:      Right lower leg: No edema.      Left lower leg: No edema.   Skin:     General: Skin is warm and dry.   Neurological:      Mental Status: She is alert and oriented to person, place, and time.   Psychiatric:         Thought Content: Thought content normal.         Judgment: Judgment normal.           ASSESSMENT/PLAN:  1. Primary hypertension  -     losartan-hydroCHLOROthiazide (HYZAAR) 50-12.5 MG per tablet; Take 1 tablet by mouth daily, Disp-90 tablet, R-1Normal  -     amLODIPine (NORVASC) 5 MG tablet; Take 1 tablet by mouth daily, Disp-90 tablet, R-1Normal  -     Basic Metabolic Panel; Future  -     BP is well-controlled today.  Can check kidney function today.  -     Patient to check pressures at home and record results.  Will follow-up on these via phone call in 2 weeks  to determine if she requires all 3 of these medications.  Otherwise follow-up in 3 months.  2. Encounter to establish care  -     AFL - Rolla Flatten, MD, Ophthalmology, [facility]  -     [place] - Coulter, Gean Maidens, MD, Gynecology, [facility]  -     Referral to eye doctor for eye exam and to GYN for evaluation of ovarian cyst and annual exam.       Return in about 3 months (around 01/15/2023) for Annual Physical. OR sooner with questions, concerns, worsening symptoms  Patient verbalized understanding and agreement to plan.      Documentation was done using voice recognition dragon software. Every effort was made to ensure accuracy; however, inadvertent unintentional computerized transcription errors may be present.     An electronic signature was used to authenticate this note.      --Julianne Handler, PA

## 2022-10-30 NOTE — Telephone Encounter (Signed)
-----   Message from [place], [place] sent at 10/16/2022 10:04 AM EST -----  Please call patient to request home BP readings.

## 2022-10-30 NOTE — Telephone Encounter (Signed)
Noted.  Recommend she consistently take home BP's and record results.

## 2022-10-30 NOTE — Telephone Encounter (Signed)
Last week it was 140/93. She has not taken it since then.

## 2023-01-03 ENCOUNTER — Encounter (HOSPITAL_COMMUNITY): Payer: Self-pay

## 2023-01-03 ENCOUNTER — Emergency Department (HOSPITAL_COMMUNITY): Payer: Medicaid Other

## 2023-01-03 ENCOUNTER — Other Ambulatory Visit: Payer: Self-pay

## 2023-01-03 ENCOUNTER — Emergency Department (HOSPITAL_COMMUNITY)
Admission: EM | Admit: 2023-01-03 | Discharge: 2023-01-03 | Disposition: A | Discharge status: Home / Self Care | Payer: Medicaid Other | Attending: Emergency Medicine | Admitting: Emergency Medicine

## 2023-01-03 DIAGNOSIS — R1031 Right lower quadrant pain: Secondary | ICD-10-CM | POA: Insufficient documentation

## 2023-01-03 DIAGNOSIS — I10 Essential (primary) hypertension: Secondary | ICD-10-CM | POA: Diagnosis not present

## 2023-01-03 DIAGNOSIS — N83202 Unspecified ovarian cyst, left side: Secondary | ICD-10-CM | POA: Insufficient documentation

## 2023-01-03 DIAGNOSIS — N83201 Unspecified ovarian cyst, right side: Secondary | ICD-10-CM

## 2023-01-03 DIAGNOSIS — R109 Unspecified abdominal pain: Secondary | ICD-10-CM | POA: Diagnosis present

## 2023-01-03 DIAGNOSIS — Z79899 Other long term (current) drug therapy: Secondary | ICD-10-CM | POA: Diagnosis not present

## 2023-01-03 LAB — COMPREHENSIVE METABOLIC PANEL
ALT: 14 U/L (ref 0–44)
AST: 21 U/L (ref 15–41)
Albumin: 3.9 g/dL (ref 3.5–5.0)
Alkaline Phosphatase: 57 U/L (ref 38–126)
Anion gap: 10 (ref 5–15)
BUN: 8 mg/dL (ref 6–20)
CO2: 25 mmol/L (ref 22–32)
Calcium: 9.7 mg/dL (ref 8.9–10.3)
Chloride: 101 mmol/L (ref 98–111)
Creatinine, Ser: 0.79 mg/dL (ref 0.44–1.00)
GFR, Estimated: >60 mL/min (ref >60)
Glucose, Bld: 109 mg/dL — ABNORMAL HIGH (ref 70–99)
Potassium: 3.8 mmol/L (ref 3.5–5.1)
Sodium: 136 mmol/L (ref 135–145)
Total Bilirubin: 0.4 mg/dL (ref 0.3–1.2)
Total Protein: 7.2 g/dL (ref 6.5–8.1)

## 2023-01-03 LAB — URINALYSIS, ROUTINE W REFLEX MICROSCOPIC
Bilirubin Urine: NEGATIVE
Glucose, UA: NEGATIVE mg/dL
Hgb urine dipstick: NEGATIVE
Ketones, ur: NEGATIVE mg/dL
Leukocytes,Ua: NEGATIVE
Nitrite: NEGATIVE
Protein, ur: NEGATIVE mg/dL
Specific Gravity, Urine: 1.017 (ref 1.005–1.030)
pH: 7 (ref 5.0–8.0)

## 2023-01-03 LAB — CBC WITH DIFFERENTIAL/PLATELET
Abs Immature Granulocytes: 0.01 10*3/uL (ref 0.00–0.07)
Basophils Absolute: 0 10*3/uL (ref 0.0–0.1)
Basophils Relative: 0 %
Eosinophils Absolute: 0.2 10*3/uL (ref 0.0–0.5)
Eosinophils Relative: 4 %
HCT: 37.1 % (ref 36.0–46.0)
Hemoglobin: 12.7 g/dL (ref 12.0–15.0)
Immature Granulocytes: 0 %
Lymphocytes Relative: 30 %
Lymphs Abs: 1.5 10*3/uL (ref 0.7–4.0)
MCH: 31.5 pg (ref 26.0–34.0)
MCHC: 34.2 g/dL (ref 30.0–36.0)
MCV: 92.1 fL (ref 80.0–100.0)
Monocytes Absolute: 0.4 10*3/uL (ref 0.1–1.0)
Monocytes Relative: 8 %
Neutro Abs: 2.8 10*3/uL (ref 1.7–7.7)
Neutrophils Relative %: 58 %
Platelets: 271 10*3/uL (ref 150–400)
RBC: 4.03 MIL/uL (ref 3.87–5.11)
RDW: 12.6 % (ref 11.5–15.5)
WBC: 4.8 10*3/uL (ref 4.0–10.5)
nRBC: 0 % (ref 0.0–0.2)

## 2023-01-03 LAB — I-STAT BETA HCG BLOOD, ED (MC, WL, AP ONLY): I-stat hCG, quantitative: <5 m[IU]/mL (ref <5)

## 2023-01-03 LAB — LIPASE, BLOOD: Lipase: 25 U/L (ref 11–51)

## 2023-01-03 MED ORDER — MORPHINE SULFATE (PF) 4 MG/ML IV SOLN
4.0000 mg | Freq: Once | INTRAVENOUS | Status: AC
  Administered 2023-01-03: 4 mg via INTRAVENOUS
  Filled 2023-01-03: qty 1

## 2023-01-03 MED ORDER — ONDANSETRON HCL 4 MG/2ML IJ SOLN
4.0000 mg | Freq: Once | INTRAMUSCULAR | Status: AC
  Administered 2023-01-03: 4 mg via INTRAVENOUS
  Filled 2023-01-03: qty 2

## 2023-01-03 MED ORDER — KETOROLAC TROMETHAMINE 15 MG/ML IJ SOLN: 15.0000 mg | Freq: Once | INTRAMUSCULAR | Status: DC

## 2023-01-03 MED ORDER — OXYCODONE HCL 5 MG PO TABS: 5.0000 mg | ORAL_TABLET | Freq: Once | ORAL | Status: DC

## 2023-01-03 MED ORDER — ONDANSETRON 4 MG PO TBDP: 4.0000 mg | ORAL_TABLET | Freq: Three times a day (TID) | ORAL | 0 refills | Status: AC | PRN

## 2023-01-03 MED ORDER — ACETAMINOPHEN 500 MG PO TABS: 1000.0000 mg | ORAL_TABLET | Freq: Once | ORAL | Status: DC

## 2023-01-03 MED ORDER — OXYCODONE-ACETAMINOPHEN 5-325 MG PO TABS: 1.0000 | ORAL_TABLET | Freq: Four times a day (QID) | ORAL | 0 refills | Status: AC | PRN

## 2023-01-03 MED ORDER — KETOROLAC TROMETHAMINE 15 MG/ML IJ SOLN
15.0000 mg | Freq: Once | INTRAMUSCULAR | Status: AC
  Administered 2023-01-03: 15 mg via INTRAVENOUS
  Filled 2023-01-03: qty 1

## 2023-01-03 MED ORDER — ONDANSETRON 4 MG PO TBDP: 4.0000 mg | ORAL_TABLET | Freq: Once | ORAL | Status: DC

## 2023-01-03 NOTE — ED Provider Notes (Signed)
3:30 PM Care assumed from previous team.  At time of transfer care, patient is waiting for results of pelvic ultrasound to rule out acute torsion for severe right lower quadrant abdominal pain that severely waxes and wanes.  CT scan showed large dermoid cyst that she reportedly was previously instructed to have a surgery on several years ago but she did not.  CT scan did not show acute appendicitis or kidney stone.  Plan of care is reassessment after ultrasound.   Ultrasound showed similar large dermoid cyst but did not show convincing evidence of torsion at this time.  Due to the clinical story being extremely concerning for the severe 10 out of 10 uncontrollable onset of pain that would rapidly improve intermittently, previous team was so concerned about possible torsion that she requested that even if ultrasound reassuring, speak to gynecologic team to discuss a plan.  6:41 PM While awaiting OB/GYN callback, patient decided she wanted to leave.  She reports he is going to [place] tomorrow and will call the OB/GYN team up there.  We will give her prescription for pain medicine and nausea medicine and she will be discharged.   Clinical Impression: 1. Right lower quadrant abdominal pain   2. Cysts of both ovaries     Disposition: Discharge  Condition: Good  I have discussed the results, Dx and Tx plan with the pt(& family if present). He/she/they expressed understanding and agree(s) with the plan. Discharge instructions discussed at great length. Strict return precautions discussed and pt &/or family have verbalized understanding of the instructions. No further questions at time of discharge.    New Prescriptions   ONDANSETRON (ZOFRAN-ODT) 4 MG DISINTEGRATING TABLET    Take 1 tablet (4 mg total) by mouth every 8 (eight) hours as needed for nausea or vomiting.   OXYCODONE-ACETAMINOPHEN (PERCOCET/ROXICET) 5-325 MG TABLET    Take 1 tablet by mouth every 6 (six) hours as needed for severe pain.     Follow Up: your OBGYN        Denton Derks, Gwenyth Allegra, MD 01/03/23 1843

## 2023-01-03 NOTE — Discharge Instructions (Signed)
Your history, exam, evaluation today was concerning for large ovarian cyst likely causing ovarian pain.  The ultrasound did not show convincing evidence of torsion and the CT scan did not show other acute surgical findings.  As your symptoms have improved and the ultrasound is reassuring, we feel you are safe for discharge home.  We initially attempted to contact OB/GYN here however as you are going back to [place] tomorrow, is reasonable to follow-up with them.  If any symptoms change or worsen acutely, please return to the nearest emergency department.  Please use the pain and nausea medicine to with symptoms and rest and stay hydrated.

## 2023-01-03 NOTE — ED Notes (Signed)
Patient transported to CT 

## 2023-01-03 NOTE — ED Notes (Signed)
Patient transported to Ultrasound 

## 2023-01-03 NOTE — ED Provider Notes (Signed)
[facility] Provider Note   CSN: [id number] Arrival date & time: 01/03/23  1200     History  Chief Complaint  Patient presents with   Abdominal Pain    Deanna Valencia is a 27 y.o. female G1P1 with HTN, BL dermoid ovarian cysts, obesity, hidradenitis suppurativa, PTSD, MDD, borderline personality disorder, GAD, who presents with R flank pain.   Pt complains of R flank pain radiating to RLQ.  Patient states pain has been around for 3 months however today at 0900 and pain acutely got worse and much more severe.  Patient described pain as throbbing in her right flank.  Patient does not tried any medications.  Severe pain that comes and goes suddenly. States it feels similar to contractions w/ birth. Patient states she does not have a history of stones.  Patient endorsed still having her appendix and gallbladder. Has had some dysuria, but that resolved today. No vaginal symptoms. Has had hemorrhagic ovarian cysts before but that felt different than this. Has had one pregnancy, complicated by HTN 4 years ago.  Per chart review patient has a history of bilateral dermoid ovarian cyst, right was measured 8 x 9 cm and left was measured 6 x 6 cm.  In 2022 OB/GYN recommended ex lap with ovarian cystectomy however they could not proceed due to her hypertension.   Abdominal Pain      Home Medications Prior to Admission medications   Medication Sig Start Date End Date Taking? Authorizing Provider  amLODipine (NORVASC) 5 MG tablet Take 1 tablet (5 mg total) by mouth daily. 03/12/22   Belva Crome, MD  losartan-hydrochlorothiazide (HYZAAR) 50-12.5 MG tablet Take 1 tablet by mouth daily. 03/12/22   Belva Crome, MD      Allergies    Amoxicillin and Tomato    Review of Systems   Review of Systems  Gastrointestinal:  Positive for abdominal pain.   Review of systems Negative for f/c.  A 10 point review of systems was performed and is negative unless  otherwise reported in HPI.  Physical Exam Updated Vital Signs BP (!) 143/87   Pulse (!) 57   Temp 98.4 F (36.9 C) (Oral)   Resp 12   Ht 5' (1.524 m)   Wt 94.5 kg   SpO2 100%   BMI 40.69 kg/m  Physical Exam General: Normal appearing female, lying in bed.  HEENT: Sclera anicteric, MMM, trachea midline.  Cardiology: RRR, no murmurs/rubs/gallops. BL radial and DP pulses equal bilaterally.  Resp: Normal respiratory rate and effort. CTAB, no wheezes, rhonchi, crackles.  Abd: RUQ, RLQ TTP. Soft, non-distended. No rebound tenderness or guarding.  GU: Deferred. MSK: No peripheral edema or signs of trauma. Extremities without deformity or TTP. No cyanosis or clubbing. Skin: warm, dry. No rashes or lesions. Back: +R CVA tenderness Neuro: A&Ox4, CNs II-XII grossly intact. MAEs. Sensation grossly intact.  Psych: Normal mood and affect.   ED Results / Procedures / Treatments   Labs (all labs ordered are listed, but only abnormal results are displayed) Labs Reviewed  COMPREHENSIVE METABOLIC PANEL - Abnormal; Notable for the following components:      Result Value   Glucose, Bld 109 (*)    All other components within normal limits  LIPASE, BLOOD  CBC WITH DIFFERENTIAL/PLATELET  URINALYSIS, ROUTINE W REFLEX MICROSCOPIC  I-STAT BETA HCG BLOOD, ED ([facility], [facility], [facility] ONLY)    EKG None  Radiology CT Renal Stone Study  Result Date: 01/03/2023  CLINICAL DATA:  Right flank pain and nausea beginning this morning. EXAM: CT ABDOMEN AND PELVIS WITHOUT CONTRAST TECHNIQUE: Multidetector CT imaging of the abdomen and pelvis was performed following the standard protocol without IV contrast. RADIATION DOSE REDUCTION: This exam was performed according to the departmental dose-optimization program which includes automated exposure control, adjustment of the mA and/or kV according to patient size and/or use of iterative reconstruction technique. COMPARISON:  None Available. FINDINGS: Lower chest: No acute  findings. Hepatobiliary: No mass visualized on this unenhanced exam. Gallbladder is unremarkable. No evidence of biliary ductal dilatation. Pancreas: No mass or inflammatory process visualized on this unenhanced exam. Spleen:  Within normal limits in size. Adrenals/Urinary tract: No evidence of urolithiasis or hydronephrosis. Unremarkable unopacified urinary bladder. Stomach/Bowel: No evidence of obstruction, inflammatory process, or abnormal fluid collections. Vascular/Lymphatic: No pathologically enlarged lymph nodes identified. No evidence of abdominal aortic aneurysm. Reproductive: Normal appearance of uterus. Bilateral adnexal masses are seen, both containing a large amount of fat and calcifications. This measures 9.2 x 6.4 cm on the right and 6.5 x 4.7 cm on the left. These are consistent with bilateral ovarian dermoids. No evidence of inflammatory changes or abnormal fluid collections. Other:  None. Musculoskeletal:  No suspicious bone lesions identified. IMPRESSION: No evidence of urolithiasis or hydronephrosis. Bilateral ovarian dermoids, measuring up to 9.2 cm on the right and 6.5 cm on the left. Electronically Signed   By: Marlaine Hind M.D.   On: 01/03/2023 13:42    Procedures Procedures    Medications Ordered in ED Medications  ketorolac (TORADOL) 15 MG/ML injection 15 mg (15 mg Intravenous Given 01/03/23 1420)  ondansetron (ZOFRAN) injection 4 mg (4 mg Intravenous Given 01/03/23 1419)  morphine (PF) 4 MG/ML injection 4 mg (4 mg Intravenous Given 01/03/23 1421)    ED Course/ Medical Decision Making/ A&P                          Medical Decision Making Amount and/or Complexity of Data Reviewed Labs: ordered. Decision-making details documented in ED Course. Radiology: ordered. Decision-making details documented in ED Course.  Risk OTC drugs. Prescription drug management.    This patient presents to the ED for concern of abd pain, this involves an extensive number of treatment  options, and is a complaint that carries with it a high risk of complications and morbidity.  I considered the following differential and admission for this acute, potentially life threatening condition.   MDM:     For DDX for abdominal pain includes but is not limited to:  Abdominal exam without peritoneal signs. No evidence of acute abdomen at this time.   Consider nephrolithiasis/ureterolithiasis given colicky flank/abd pain. Also consider ovarian torsion given h/o large bilateral dermoid cysts and colicky pain.  Per chart review cyst measured in 2022 at 8 x 9 cm on the right and 6 x 6 cm on the left.  OB had recommended a cystectomy but patient did not proceed due to hypertension. Also w/ RUQ TTP, consider cholelithiasis/cholecystitis. Patient is witnessed in the room to have acute onset and offset of severe pain. Also consider appendicitis, pancreatitis, diverticulitis.     Clinical Course as of 01/03/23 1531  Sat Jan 03, 2023  1400 Lipase: 25 [HN]  1400 I-stat hCG, quantitative: <5.0 [HN]  1400 Comprehensive metabolic panel(!) unremarkable [HN]  1400 CBC with Diff wnl [HN]  1404 CT Renal Stone Study No evidence of urolithiasis or hydronephrosis.  Bilateral ovarian dermoids, measuring up  to 9.2 cm on the right and 6.5 cm on the left.   [HN]  [id number] D/t colicky severe RLQ/R flank pain and findings of ovarian dermoids up to nearly 10 cm on the R, high degree of suspicion for ovarian torsion. Will get US doppler. [HN]  1529 Urinalysis, Routine w reflex microscopic -Urine, Clean Catch Neg [HN]  1529 Patient is signed out to the oncoming ED physician Dr. Sherry Ruffing who is made aware of her history, presentation, exam, workup, and plan.   [HN]    Clinical Course User Index [HN] Audley Hose, MD    Labs: I Ordered, and personally interpreted labs.  The pertinent results include: Those listed above  Imaging Studies ordered: CT renal stone study ordered from triage.  I ordered  imaging studies including ultrasound and complete Doppler. I independently visualized and interpreted imaging. I agree with the radiologist interpretation  Additional history obtained from chart review.   Cardiac Monitoring: The patient was maintained on a cardiac monitor.  I personally viewed and interpreted the cardiac monitored which showed an underlying rhythm of: Normal sinus rhythm  Reevaluation: After the interventions noted above, I reevaluated the patient and found that they have :improved  Social Determinants of Health: Patient lives independently   Disposition:  Patient is signed out to the oncoming ED physician who is made aware of her history, presentation, exam, workup, and plan. Plan is to obtain US pelvic and likely d/w OB.   Co morbidities that complicate the patient evaluation  Past Medical History:  Diagnosis Date   Anxiety    Depression    Eating disorder      Medicines Meds ordered this encounter  Medications   DISCONTD: acetaminophen (TYLENOL) tablet 1,000 mg   DISCONTD: ketorolac (TORADOL) 15 MG/ML injection 15 mg   DISCONTD: ketorolac (TORADOL) 15 MG/ML injection 15 mg   DISCONTD: oxyCODONE (Oxy IR/ROXICODONE) immediate release tablet 5 mg   DISCONTD: ondansetron (ZOFRAN-ODT) disintegrating tablet 4 mg   ketorolac (TORADOL) 15 MG/ML injection 15 mg   ondansetron (ZOFRAN) injection 4 mg   morphine (PF) 4 MG/ML injection 4 mg    I have reviewed the patients home medicines and have made adjustments as needed  Problem List / ED Course: Problem List Items Addressed This Visit   None               This note was created using dictation software, which may contain spelling or grammatical errors.    Audley Hose, MD 01/03/23 [phone]

## 2023-01-03 NOTE — ED Notes (Signed)
Pt actively vomiting; zofran administered. See [facility]

## 2023-01-03 NOTE — ED Notes (Signed)
Pt is back from CT

## 2023-01-03 NOTE — ED Provider Triage Note (Signed)
Emergency Medicine Provider Triage Evaluation Note  STEFHANIE AYRES , a 27 y.o. female  was evaluated in triage.  Pt complains of R flank pain.  Patient states pain has been around for 3 months however today at 09 100 and pain has gotten worse.  Patient described pain as throbbing in her right flank.  Patient does not tried any medications.  Patient states she does not have a history of stones.  Patient endorsed still having her appendix and gallbladder.  Patient denies chest pain, shortness of breath, fevers, dysuria, vaginal bleeding  Review of Systems  Positive: See HPI Negative: See HPI  Physical Exam  BP 119/71 (BP Location: Right Arm)   Pulse (!) 59   Temp 98.4 F (36.9 C) (Oral)   Resp 18   Ht 5' (1.524 m)   Wt 94.5 kg   SpO2 100%   BMI 40.69 kg/m  Gen:   Awake, no distress   Resp:  Normal effort  MSK:   Moves extremities without difficulty  Other:  Right flank tenderness to palpation however no right CVA tenderness, slightly tender to right lower quad, no peritoneal signs noted  Medical Decision Making  Medically screening exam initiated at 12:09 PM.  Appropriate orders placed.  PRERANA JODOIN was informed that the remainder of the evaluation will be completed by another provider, this initial triage assessment does not replace that evaluation, and the importance of remaining in the ED until their evaluation is complete.  Workup initiated, patient stable at this time   Elvina Sidle 01/03/23 1217

## 2023-01-03 NOTE — ED Triage Notes (Signed)
Pt c/o RLQ pain and nausea started at 0900 today. Pt denies any other sx.

## 2023-01-15 ENCOUNTER — Ambulatory Visit: Admit: 2023-01-15 | Discharge: 2023-01-15 | Payer: PRIVATE HEALTH INSURANCE

## 2023-01-15 DIAGNOSIS — N83202 Unspecified ovarian cyst, left side: Secondary | ICD-10-CM

## 2023-01-15 NOTE — Progress Notes (Signed)
 SUBJECTIVE/OBJECTIVE:  Katie Hopkins (DOB:  [birth year]) is a 27 y.o. female here for   Chief Complaint: Cyst (On ovaries found on US  in ER while out of town )      HPI    Patient is a 27 year old female with past medical history significant for hypertension who is here today for ER follow-up.  [place]  States she was having bad pains in her back and her stomach, similar to labor per patient.  Records not available to review.  States that they did testing and found an ovarian cyst and told her to follow-up with PCP.    Will fax for ER notes.    Today patient is here for follow-up of ER visit.  States that she was having right-sided stomach and back pain.  Does state that she knew she had 2 cysts on her ovaries.  This was discovered back in [place]  when she lived there.  Was supposed to have surgery, but blood pressure was too high to complete.  States that cyst on right is 10 cm and cyst on left is 6 cm.  Since her hospitalization, she was given some pain medication.  Took this for 2 days and then pain resolved.  Has not had pain symptoms.    HTN-blood pressure well-controlled today on current regimen.  No side effects or concerns.  The patient denies any chest pain, chest tightness, palpitations, lower extremity edema, shortness of breath, headaches, dizziness, or blurred vision.      Review of Systems   Constitutional:  Negative for chills and fever.   Respiratory:  Negative for shortness of breath.    Cardiovascular:  Negative for chest pain and leg swelling.   Gastrointestinal:  Negative for abdominal pain, nausea and vomiting.   Genitourinary:  Negative for pelvic pain.   Musculoskeletal:  Negative for back pain.        Vitals:    01/15/23 1245   BP: 114/82   Pulse: 92   Resp: 16   Temp: 98.2 F (36.8 C)   SpO2: 98%        Physical Exam  Constitutional:       General: She is not in acute distress.     Appearance: Normal appearance. She is not  ill-appearing.   HENT:      Head: Normocephalic and atraumatic.   Eyes:      Extraocular Movements: Extraocular movements intact.   Neck:      Thyroid: No thyroid mass.   Cardiovascular:      Rate and Rhythm: Normal rate and regular rhythm.      Pulses: Normal pulses.      Heart sounds: No murmur heard.     No friction rub. No gallop.   Pulmonary:      Effort: Pulmonary effort is normal.      Breath sounds: Normal breath sounds. No wheezing, rhonchi or rales.   Abdominal:      Palpations: Abdomen is soft. There is no mass.      Tenderness: There is no abdominal tenderness.   Musculoskeletal:      Right lower leg: No edema.      Left lower leg: No edema.   Skin:     General: Skin is warm and dry.   Neurological:      Mental Status: She is alert and oriented to person, place, and time.   Psychiatric:         Thought Content: Thought content  normal.         Judgment: Judgment normal.           ASSESSMENT/PLAN:  1. Cysts of both ovaries  -     AFL - Dietrich Mora, MD, Gynecology, [facility]  -     Referral to GYN for further evaluation of cyst and possible removal.  Encourage patient to call without delay.  2. Primary hypertension         -     Stable.  Continue current regimen.  Follow-up on blood pressure in 6 months.      Return in about 6 months (around 07/18/2023) for BP follow up. OR sooner with questions, concerns, worsening symptoms  Patient verbalized understanding and agreement to plan.      Documentation was done using voice recognition dragon software. Every effort was made to ensure accuracy; however, inadvertent unintentional computerized transcription errors may be present.     An electronic signature was used to authenticate this note.      --Aleck Early, PA

## 2023-05-05 ENCOUNTER — Encounter

## 2023-05-05 ENCOUNTER — Ambulatory Visit: Admit: 2023-05-05 | Discharge: 2023-05-05 | Payer: PRIVATE HEALTH INSURANCE

## 2023-05-05 ENCOUNTER — Telehealth

## 2023-05-05 DIAGNOSIS — I10 Essential (primary) hypertension: Secondary | ICD-10-CM

## 2023-05-05 MED ORDER — ZEPBOUND 2.5 MG/0.5ML SC SOAJ
2.5 | SUBCUTANEOUS | 0 refills | Status: DC
Start: 2023-05-05 — End: 2023-05-07

## 2023-05-05 NOTE — Progress Notes (Signed)
SUBJECTIVE/OBJECTIVE:  Katie Hopkins (DOB:  [birth year]) is a 27 y.o. female here for   Chief Complaint: Discuss Medications      HPI    Patient is a 27 year old female with past medical history significant for hypertension and ovarian cysts who is for follow-up.    HTN-blood pressure is very well-controlled on Hyzaar 50-12.5 mg daily and amlodipine 5 mg daily.  Patient is tolerating medication well with no side effects.The patient denies any chest pain, chest tightness, palpitations, lower extremity edema, shortness of breath, headaches, dizziness, or blurred vision.    Ovarian cyst-these were diagnosed when patient was seen in the emergency department in [place].  Has not followed up with gynecology since she has moved to [place].  Is requesting referral today.  Has not had any further pelvic pain since her visit to the ER several months ago    BMI 38.32-inquiring today about the use of Zepbound or Wegovy for weight loss.  Has heard that these can be very beneficial for blood pressure as well.    Review of Systems   Constitutional:  Negative for chills and fever.   Eyes:  Negative for visual disturbance.   Respiratory:  Negative for cough, chest tightness, shortness of breath and wheezing.    Cardiovascular:  Negative for chest pain, palpitations and leg swelling.   Neurological:  Negative for dizziness, syncope, speech difficulty, light-headedness and headaches.        Vitals:    05/05/23 0935   BP: 126/78   Pulse: 84   Resp: 18        Physical Exam  Constitutional:       General: She is not in acute distress.     Appearance: Normal appearance. She is not ill-appearing.   HENT:      Head: Normocephalic and atraumatic.   Eyes:      Extraocular Movements: Extraocular movements intact.   Neck:      Thyroid: No thyroid mass.   Cardiovascular:      Rate and Rhythm: Normal rate and regular rhythm.      Pulses: Normal pulses.      Heart sounds: No murmur heard.     No friction rub. No gallop.    Pulmonary:      Effort: Pulmonary effort is normal.      Breath sounds: Normal breath sounds. No wheezing, rhonchi or rales.   Musculoskeletal:      Cervical back: Neck supple. No tenderness.      Right lower leg: No edema.      Left lower leg: No edema.   Lymphadenopathy:      Cervical: No cervical adenopathy.   Skin:     General: Skin is warm and dry.   Neurological:      Mental Status: She is alert and oriented to person, place, and time.   Psychiatric:         Thought Content: Thought content normal.         Judgment: Judgment normal.           ASSESSMENT/PLAN:  1. Primary hypertension  -     Comprehensive Metabolic Panel; Future  -     Well-controlled today.  Continue current regimen.  2. Cysts of both ovaries  -     AFL - Faylene Kurtz, MD, Gynecology, [facility]  -     Referral to GYN for evaluation and to establish care.  3. BMI 38.0-38.9,adult  -     Hemoglobin A1C;  Future  -     Screen for diabetes as this runs in her family.  Did discuss with patient the difference between [place] versus Ozempic and Mounjaro versus Zepbound.  Have encouraged her to check with her insurance on coverage of these medications and report back.  If covered, can start for weight loss.         Return in about 6 months (around 11/04/2023) for Annual Physical. OR sooner with questions, concerns, worsening symptoms  Patient verbalized understanding and agreement to plan.      Documentation was done using voice recognition dragon software. Every effort was made to ensure accuracy; however, inadvertent unintentional computerized transcription errors may be present.     An electronic signature was used to authenticate this note.      --Julianne Handler, PA

## 2023-05-05 NOTE — Telephone Encounter (Signed)
Katie Hopkins you need to order RX in order for PA to be started

## 2023-05-05 NOTE — Patient Instructions (Signed)
Ask your insurance company for coverage on Wegovy and Zepbound for weight loss.

## 2023-05-05 NOTE — Telephone Encounter (Signed)
Patient called and checked with her insurance and the [place] and Zepbound will need a PA..   It will not be covered if the dx is weight loss, but will be covered for HTN.  [facility] [phone].   Patient can be reached at [phone].

## 2023-05-06 LAB — COMPREHENSIVE METABOLIC PANEL
ALT: 13 U/L (ref 10–40)
AST: 16 U/L (ref 15–37)
Albumin/Globulin Ratio: 1.5 (ref 1.1–2.2)
Albumin: 4.4 g/dL (ref 3.4–5.0)
Alkaline Phosphatase: 84 U/L (ref 40–129)
Anion Gap: 11 (ref 3–16)
BUN: 13 mg/dL (ref 7–20)
CO2: 29 mmol/L (ref 21–32)
Calcium: 9.9 mg/dL (ref 8.3–10.6)
Chloride: 102 mmol/L (ref 99–110)
Creatinine: 0.8 mg/dL (ref 0.6–1.1)
Est, Glom Filt Rate: >90 (ref >60)
Glucose: 76 mg/dL (ref 70–99)
Potassium: 4.3 mmol/L (ref 3.5–5.1)
Sodium: 142 mmol/L (ref 136–145)
Total Bilirubin: <0.2 mg/dL (ref 0.0–1.0)
Total Protein: 7.4 g/dL (ref 6.4–8.2)

## 2023-05-06 LAB — HEMOGLOBIN A1C
Estimated Avg Glucose: 102.5 mg/dL
Hemoglobin A1C: 5.2 %

## 2023-05-06 NOTE — Telephone Encounter (Signed)
I am not sure who the patient spoke to but I can assure you that there is no way I will be able to get Zepbound approved with the diagnosis code for HTN. It is indicated for obesity. Could I get a new diagnosis code to submit this PA with.     Thank you

## 2023-05-06 NOTE — Telephone Encounter (Signed)
Please process prior auth - thanks      Tirzepatide-Weight Management (ZEPBOUND) 2.5 MG/0.5ML SOAJ Inject 2.5 mg into the skin once a week Dispense: 2 mL, Refills: 0 ordered --     06/25/202

## 2023-05-06 NOTE — Telephone Encounter (Signed)
The patient called the OB/GYN office she was given and they do not accept her insurance.  Please advise.  [phone].

## 2023-05-06 NOTE — Telephone Encounter (Signed)
Spoke to patient and informed her to obtain names from her insurance and [place] [employer] can review that. It is easier then referring to multiple since we do not know who is covered.

## 2023-05-07 MED ORDER — ZEPBOUND 2.5 MG/0.5ML SC SOAJ
2.5 MG/0.5ML | SUBCUTANEOUS | 0 refills | Status: DC
Start: 2023-05-07 — End: 2024-02-15

## 2023-05-07 NOTE — Addendum Note (Signed)
Addended by: Julianne Handler on: 05/07/2023 08:33 AM     Modules accepted: Orders

## 2023-05-07 NOTE — Telephone Encounter (Signed)
Submitted PA for Zepbound 2.5MG /0.5ML pen-injectors   Via CMM  Key: [id number] STATUS: Medication is a PLAN EXCLUSION.    Please notify patient. Thank you.

## 2023-05-07 NOTE — Telephone Encounter (Signed)
FYI

## 2023-05-07 NOTE — Telephone Encounter (Signed)
LVM to return call.

## 2023-05-08 NOTE — Telephone Encounter (Signed)
Pt is aware.  

## 2023-06-06 ENCOUNTER — Emergency Department: Admit: 2023-06-06 | Payer: PRIVATE HEALTH INSURANCE

## 2023-06-06 ENCOUNTER — Inpatient Hospital Stay
Admit: 2023-06-06 | Discharge: 2023-06-06 | Disposition: A | Discharge status: Home / Self Care | Payer: PRIVATE HEALTH INSURANCE | Attending: Student in an Organized Health Care Education/Training Program

## 2023-06-06 DIAGNOSIS — D271 Benign neoplasm of left ovary: Secondary | ICD-10-CM

## 2023-06-06 DIAGNOSIS — N83202 Unspecified ovarian cyst, left side: Secondary | ICD-10-CM

## 2023-06-06 LAB — CBC WITH AUTO DIFFERENTIAL
Basophils %: 0.7 %
Basophils Absolute: 0.1 10*3/uL (ref 0.0–0.2)
Eosinophils %: 1.9 %
Eosinophils Absolute: 0.2 10*3/uL (ref 0.0–0.6)
Hematocrit: 37.7 % (ref 36.0–48.0)
Hemoglobin: 13.4 g/dL (ref 12.0–16.0)
Lymphocytes %: 28.6 %
Lymphocytes Absolute: 2.2 10*3/uL (ref 1.0–5.1)
MCH: 32.4 pg (ref 26.0–34.0)
MCHC: 35.6 g/dL (ref 31.0–36.0)
MCV: 91.3 fL (ref 80.0–100.0)
MPV: 10.3 fL (ref 5.0–10.5)
Monocytes %: 5.9 %
Monocytes Absolute: 0.5 10*3/uL (ref 0.0–1.3)
Neutrophils %: 62.9 %
Neutrophils Absolute: 4.9 10*3/uL (ref 1.7–7.7)
Platelets: 235 10*3/uL (ref 135–450)
RBC: 4.13 M/uL (ref 4.00–5.20)
RDW: 13.2 % (ref 12.4–15.4)
WBC: 7.8 10*3/uL (ref 4.0–11.0)

## 2023-06-06 LAB — URINALYSIS WITH REFLEX TO CULTURE
Bilirubin, Urine: NEGATIVE
Blood, Urine: NEGATIVE
Glucose, Ur: NEGATIVE mg/dL
Ketones, Urine: NEGATIVE mg/dL
Leukocyte Esterase, Urine: NEGATIVE
Nitrite, Urine: NEGATIVE
Protein, UA: NEGATIVE mg/dL
Specific Gravity, UA: 1.02 (ref 1.005–1.030)
Urobilinogen, Urine: 0.2 E.U./dL (ref <2.0)
pH, Urine: 7.5 (ref 5.0–8.0)

## 2023-06-06 LAB — COMPREHENSIVE METABOLIC PANEL W/ REFLEX TO MG FOR LOW K
ALT: 12 U/L (ref 10–40)
AST: 16 U/L (ref 15–37)
Albumin/Globulin Ratio: 1.2 (ref 1.1–2.2)
Albumin: 4 g/dL (ref 3.4–5.0)
Alkaline Phosphatase: 81 U/L (ref 40–129)
Anion Gap: 16 (ref 3–16)
BUN: 13 mg/dL (ref 7–20)
CO2: 24 mmol/L (ref 21–32)
Calcium: 10.3 mg/dL (ref 8.3–10.6)
Chloride: 99 mmol/L (ref 99–110)
Creatinine: 0.8 mg/dL (ref 0.6–1.1)
Est, Glom Filt Rate: >90 (ref >60)
Glucose: 114 mg/dL — ABNORMAL HIGH (ref 70–99)
Potassium reflex Magnesium: 3.8 mmol/L (ref 3.5–5.1)
Sodium: 139 mmol/L (ref 136–145)
Total Bilirubin: <0.2 mg/dL (ref 0.0–1.0)
Total Protein: 7.3 g/dL (ref 6.4–8.2)

## 2023-06-06 LAB — PREGNANCY, URINE: Pregnancy, Urine: NEGATIVE

## 2023-06-06 LAB — LIPASE: Lipase: 12 U/L — ABNORMAL LOW (ref 13.0–60.0)

## 2023-06-06 MED ORDER — KETOROLAC TROMETHAMINE 15 MG/ML IJ SOLN
15 | Freq: Once | INTRAMUSCULAR | Status: AC
Start: 2023-06-06 — End: 2023-06-06
  Administered 2023-06-06: 14:00:00 15 mg via INTRAVENOUS

## 2023-06-06 MED ORDER — IOMEPROL 71.44 % IV SOLN
71.44 | Freq: Once | INTRAVENOUS | Status: AC | PRN
Start: 2023-06-06 — End: 2023-06-06
  Administered 2023-06-06: 15:00:00 100 mL via INTRAVENOUS

## 2023-06-06 MED ORDER — MORPHINE SULFATE (PF) 4 MG/ML IV SOLN
4 | Freq: Once | INTRAVENOUS | Status: DC | PRN
Start: 2023-06-06 — End: 2023-06-06

## 2023-06-06 MED ORDER — IBUPROFEN 800 MG PO TABS
800 MG | ORAL_TABLET | Freq: Three times a day (TID) | ORAL | 0 refills | Status: DC | PRN
Start: 2023-06-06 — End: 2024-02-18

## 2023-06-06 MED ORDER — ONDANSETRON HCL 4 MG/2ML IJ SOLN
4 | Freq: Once | INTRAMUSCULAR | Status: AC
Start: 2023-06-06 — End: 2023-06-06
  Administered 2023-06-06: 14:00:00 4 mg via INTRAVENOUS

## 2023-06-06 MED FILL — KETOROLAC TROMETHAMINE 15 MG/ML IJ SOLN: 15 MG/ML | INTRAMUSCULAR | Qty: 1

## 2023-06-06 MED FILL — ONDANSETRON HCL 4 MG/2ML IJ SOLN: 4 MG/2ML | INTRAMUSCULAR | Qty: 2

## 2023-06-06 NOTE — ED Provider Notes (Signed)
[facility] EMERGENCY DEPARTMENT  EMERGENCY DEPARTMENT ENCOUNTER        Pt Name: Katie Hopkins  MRN: [id number]  Birthdate 10/23/1996  Date of evaluation: 06/06/2023  Provider: Tessie Fass, APRN - CNP  PCP: Julianne Handler, PA  Note Started: 3:21 PM EDT 06/06/23      APP. I have evaluated this patient.        CHIEF COMPLAINT       Chief Complaint   Patient presents with    Flank Pain     Right side for 2 days  tearful moaning  states when she urinates feels pain in right side  worse this morning       HISTORY OF PRESENT ILLNESS: 1 or more Elements     History from : Patient    Limitations to history : None    Katie Hopkins is a 27 y.o. female who presents to the emergency department today complaining of right flank pain.  Onset was 2 days ago.  The context is that the symptoms started spontaneously and have not constant.  Pain rated 10/10 on arrival to initial ER.  No nausea or vomiting.  Bowels been normal.  No vaginal discharge or bleeding.  No burning with urination or blood in urine.  Patient transferred to [facility] from outlying facility for pelvic ultrasound.  See previous provider's note for full context of arrival.    Nursing Notes were all reviewed and agreed with or any disagreements were addressed in the HPI.    REVIEW OF SYSTEMS :      Review of Systems   Constitutional:  Negative for chills, diaphoresis and fever.   HENT: Negative.     Eyes:  Negative for visual disturbance.   Respiratory:  Negative for cough and shortness of breath.    Cardiovascular:  Negative for chest pain.   Gastrointestinal:  Positive for abdominal pain. Negative for constipation, diarrhea, nausea and vomiting.   Genitourinary:  Positive for pelvic pain. Negative for dysuria, flank pain, frequency and hematuria.   Musculoskeletal:  Negative for back pain.   Skin:  Negative for color change, pallor and rash.   Allergic/Immunologic: Negative for immunocompromised state.   Neurological:  Negative for  dizziness, weakness and headaches.   Hematological:  Negative for adenopathy.   Psychiatric/Behavioral:  Negative for confusion.    All other systems reviewed and are negative.      Positives and Pertinent negatives as per HPI.     SURGICAL HISTORY   No past surgical history on file.    CURRENTMEDICATIONS       Previous Medications    AMLODIPINE (NORVASC) 5 MG TABLET    Take 1 tablet by mouth daily    LOSARTAN-HYDROCHLOROTHIAZIDE (HYZAAR) 50-12.5 MG PER TABLET    Take 1 tablet by mouth daily    TIRZEPATIDE-WEIGHT MANAGEMENT (ZEPBOUND) 2.5 MG/0.5ML SOAJ    Inject 2.5 mg into the skin once a week       ALLERGIES     Amoxicillin    FAMILYHISTORY       Family History   Problem Relation Age of Onset    High Cholesterol Father     Diabetes Father     Hypertension Father     Cancer Maternal Grandfather         Prostate    Heart Attack Paternal Grandfather     High Cholesterol Other     Diabetes Other     Hypertension Other  SOCIAL HISTORY       Social History     Tobacco Use    Smoking status: Every Day     Types: Cigarettes    Smokeless tobacco: Never    Tobacco comments:     2 packs per week   Vaping Use    Vaping Use: Never used   Substance Use Topics    Alcohol use: Yes     Comment: Socially    Drug use: Yes     Frequency: 7.0 times per week     Types: Marijuana (Weed)       SCREENINGS        Glasgow Coma Scale  Eye Opening: Spontaneous  Best Verbal Response: Oriented  Best Motor Response: Obeys commands  Glasgow Coma Scale Score: 15                CIWA Assessment  BP: 138/87  Pulse: 72           PHYSICAL EXAM  1 or more Elements     ED Triage Vitals [06/06/23 1004]   BP Temp Temp Source Pulse Respirations SpO2 Height Weight - Scale   (!) 149/99 98.6 F (37 C) Oral 90 20 98 % 1.524 m (5') 88.9 kg (195 lb 15.8 oz)       Physical Exam  Vitals and nursing note reviewed.   Constitutional:       General: She is not in acute distress.     Appearance: Normal appearance. She is well-developed. She is not  toxic-appearing.   HENT:      Head: Normocephalic and atraumatic.   Eyes:      General: No scleral icterus.     Conjunctiva/sclera: Conjunctivae normal.   Neck:      Vascular: No JVD.   Cardiovascular:      Rate and Rhythm: Normal rate and regular rhythm.      Heart sounds: Normal heart sounds.   Pulmonary:      Effort: Pulmonary effort is normal. No respiratory distress.      Breath sounds: Normal breath sounds.   Abdominal:      General: There is no distension.      Palpations: Abdomen is soft. Abdomen is not rigid.      Tenderness: There is abdominal tenderness.       Musculoskeletal:         General: Normal range of motion.      Cervical back: Normal range of motion.   Skin:     General: Skin is warm and dry.      Findings: No rash.   Neurological:      General: No focal deficit present.      Mental Status: She is alert and oriented to person, place, and time.   Psychiatric:         Mood and Affect: Mood normal.             DIAGNOSTIC RESULTS   LABS:    Labs Reviewed   COMPREHENSIVE METABOLIC PANEL W/ REFLEX TO MG FOR LOW K - Abnormal; Notable for the following components:       Result Value    Glucose 114 (*)     All other components within normal limits   LIPASE - Abnormal; Notable for the following components:    Lipase 12.0 (*)     All other components within normal limits   URINALYSIS WITH REFLEX TO CULTURE   PREGNANCY, URINE   CBC WITH AUTO  DIFFERENTIAL       When ordered only abnormal lab results are displayed. All other labs were within normal range or not returned as of this dictation.    EKG: When ordered, EKG's are interpreted by the Emergency Department Physician in the absence of a cardiologist.  Please see their note for interpretation of EKG.    RADIOLOGY:   Non-plain film images such as CT, Ultrasound and MRI are read by the radiologist. Plain radiographic images are visualized and preliminarily interpreted by the ED Provider with the below findings:        Interpretation per the Radiologist  below, if available at the time of this note:    [place] DUP ABD PEL RETRO SCROT COMPLETE   Final Result   Large bilateral ovarian dermoids.  Negative for torsion.         US PELVIS COMPLETE   Final Result   Large bilateral ovarian dermoids.  Negative for torsion.         CT ABDOMEN PELVIS W IV CONTRAST Additional Contrast? None   Final Result   Large bilateral ovarian dermoid tumors.           [place] DUP ABD PEL RETRO SCROT COMPLETE    Result Date: 06/06/2023  EXAMINATION: PELVIC ULTRASOUND; DOPPLER EVALUATION OF THE PELVIS 06/06/2023 TECHNIQUE: Transabdominal pelvic ultrasound duplex ultrasound using B-mode/gray scaled imaging, Doppler spectral analysis and color flow Doppler was obtained. COMPARISON: None HISTORY: ORDERING SYSTEM PROVIDED HISTORY: R/O torsion TECHNOLOGIST PROVIDED HISTORY: Reason for exam:->R/O torsion; ORDERING SYSTEM PROVIDED HISTORY: s TECHNOLOGIST PROVIDED HISTORY: Reason for exam:->s FINDINGS: Measurements: Uterus: 7.4 x 4.9 x 3.9 cm Endometrial stripe: 4 mm Right Ovary:Not measured. Left Ovary: Not measured. Ultrasound Findings: Uterus: Uterus demonstrates normal myometrial echotexture. Endometrial stripe: Endometrial stripe is within normal limits. Right Ovary: There is a large dermoid in the right adnexal region with dimensions of 7.8 x 7 x 5.3 cm.  Normal Doppler flow is demonstrated. Left Ovary:  There is a large dermoid within the left adnexal region measuring 6.1 x 6.7 x 4.7 cm.  Normal Doppler flow is demonstrated. Free Fluid: No evidence of free fluid.     Large bilateral ovarian dermoids.  Negative for torsion.     US PELVIS COMPLETE    Result Date: 06/06/2023  EXAMINATION: PELVIC ULTRASOUND; DOPPLER EVALUATION OF THE PELVIS 06/06/2023 TECHNIQUE: Transabdominal pelvic ultrasound duplex ultrasound using B-mode/gray scaled imaging, Doppler spectral analysis and color flow Doppler was obtained. COMPARISON: None HISTORY: ORDERING SYSTEM PROVIDED HISTORY: R/O torsion TECHNOLOGIST PROVIDED HISTORY:  Reason for exam:->R/O torsion; ORDERING SYSTEM PROVIDED HISTORY: s TECHNOLOGIST PROVIDED HISTORY: Reason for exam:->s FINDINGS: Measurements: Uterus: 7.4 x 4.9 x 3.9 cm Endometrial stripe: 4 mm Right Ovary:Not measured. Left Ovary: Not measured. Ultrasound Findings: Uterus: Uterus demonstrates normal myometrial echotexture. Endometrial stripe: Endometrial stripe is within normal limits. Right Ovary: There is a large dermoid in the right adnexal region with dimensions of 7.8 x 7 x 5.3 cm.  Normal Doppler flow is demonstrated. Left Ovary:  There is a large dermoid within the left adnexal region measuring 6.1 x 6.7 x 4.7 cm.  Normal Doppler flow is demonstrated. Free Fluid: No evidence of free fluid.     Large bilateral ovarian dermoids.  Negative for torsion.     CT ABDOMEN PELVIS W IV CONTRAST Additional Contrast? None    Result Date: 06/06/2023  EXAMINATION: CT OF THE ABDOMEN AND PELVIS WITH CONTRAST 06/06/2023 11:08 am TECHNIQUE: CT of the abdomen and pelvis was performed with the  administration of intravenous contrast. Multiplanar reformatted images are provided for review. Automated exposure control, iterative reconstruction, and/or weight based adjustment of the mA/kV was utilized to reduce the radiation dose to as low as reasonably achievable. COMPARISON: None. HISTORY: ORDERING SYSTEM PROVIDED HISTORY: right flank pain TECHNOLOGIST PROVIDED HISTORY: Reason for exam:->right flank pain Additional Contrast?->None Decision Support Exception - unselect if not a suspected or confirmed emergency medical condition->Emergency Medical Condition (MA) Reason for Exam: Patient c/o right flank pain that began a few days ago and is progressively getting worse, some nausea, patient has ovarian cysts that she said she should have had removed, denies surgeries to A/P, lower abd pressure FINDINGS: Lower Chest: There is no consolidation or effusion Organs: The liver, pancreas, spleen, kidneys and adrenals are unremarkable.  GI/Bowel: There is no bowel dilatation, wall thickening or obstruction.  The appendix is normal. Pelvis: There are large bilateral ovarian dermoid tumors measuring 6 cm on the left and 8 cm on the right.  There is a small amount of fluid within the pelvis.  The bladder and uterus are unremarkable. Peritoneum/Retroperitoneum: There is no free air or intraperitoneal inflammatory change.  There is no adenopathy. Bones/Soft Tissues: There is no acute fracture or aggressive osseous lesion.     Large bilateral ovarian dermoid tumors.       No results found.    PROCEDURES   Unless otherwise noted below, none     Procedures    CRITICAL CARE TIME (.cctime)       PAST MEDICAL HISTORY      has a past medical history of Hypertension.     Chronic Conditions affecting Care:     EMERGENCY DEPARTMENT COURSE and DIFFERENTIAL DIAGNOSIS/MDM:   Vitals:    Vitals:    06/06/23 1004 06/06/23 1015 06/06/23 1115   BP: (!) 149/99 (!) 142/90 138/87   Pulse: 90 88 72   Resp: 20     Temp: 98.6 F (37 C)  98.3 F (36.8 C)   TempSrc: Oral  Oral   SpO2: 98% 100% 100%   Weight: 88.9 kg (195 lb 15.8 oz)     Height: 1.524 m (5')         Patient was given the following medications:  Medications   morphine (PF) injection 4 mg (has no administration in time range)   ketorolac (TORADOL) injection 15 mg (15 mg IntraVENous Given 06/06/23 1014)   ondansetron (ZOFRAN) injection 4 mg (4 mg IntraVENous Given 06/06/23 1014)   iomeprol (IOMERON 350) 71.44 % injection 100 mL (100 mLs IntraVENous Given 06/06/23 1108)       ED Course as of 06/06/23 1525   Sat Jun 06, 2023   1029 Nitrite, Urine: Negative [AL]   1029 Leukocyte Esterase, Urine: Negative [AL]   1037 Pregnancy, Urine: Negative [AL]   1042 CBC with Auto Differential  Delayed to machine malfunction will need to be sent by courier. [AL]   1059 Lipase(!): 12.0 [AL]   1059 Creatinine: 0.8 [AL]   1059 Est, Glom Filt Rate: >90 [AL]   1059 ALT: 12 [AL]   1059 AST: 16 [AL]   1128 CT ABDOMEN PELVIS W IV CONTRAST  Additional Contrast? None  "IMPRESSION:  Large bilateral ovarian dermoid tumors.     " [AL]      ED Course User Index  [AL] Dennard Schaumann, MD        Is this patient to be included in the SEP-1 Core Measure due to severe sepsis or septic shock?  No   Exclusion criteria - the patient is NOT to be included for SEP-1 Core Measure due to:  Infection is not suspected    CONSULTS: (Who and What was discussed)  None  Discussion with Other Profesionals : None    Social Determinants : None    Records Reviewed : None    Differential diagnosis: Abdominal Aortic Aneurysm, Ischemic Bowel, Bowel Obstruction, Appendicitis, Diverticulitis, Pyelonephritis, UTI, STD, Ureterolithiasis, Colitis, Gonad Torsion, other    Patient seen and examined today for right flank pain.  See HPI for patient presentation.  Patient is hemodynamically stable, nontoxic, afebrile, and without tachycardia, tachypnea, and hypoxia.  Physical exam as above.  27 year old female lying in bed in no acute distress.  Awake alert and oriented.  Mild tenderness over the right and left-sided pelvis with palpation.  No rebound tenderness.  Abdomen is nonsurgical.  Pregnancy negative.  Urine shows no infection or blood.  CBC and chemistry normal.  CAT scan of the abdomen normal including visualized appendix.  Pelvic ultrasound shows large bilateral ovarian dermoids with no evidence for torsion.  Emergency department course included Toradol, Zofran, and morphine.  On my final exam patient resting very comfortably in bed texting on her phone.  Abdomen nonsurgical.  Patient will be given a prescription for 800 ibuprofen.  She advised that she has a couple oxycodone left at home in case the pain gets very bad.  She was given referral to OB/GYN and discharged home in stable condition.  At this time, the evidence for any other entities in the differential is insufficient to justify any further testing.  This was explained to the patient.  The patient was advised  that persistent or worsening symptoms will require further evaluation.    I discussed with Katlan Leser and/or family the exam results, diagnosis, care, prognosis, reasons to return and the importance of follow up. Patient and/or family is in full agreement with plan and all questions have been answered.  Specific discharge instructions explained, including reasons to return to the emergency department. Michala Barbaro is well appearing, non-toxic, and afebrile at the time of discharge. Patient was instructed to follow up with primary care provider in 24-48 hours, and to instructed to return to ED immediately for any new or worsening concerns.  Addilyne Agramonte verbalized understanding and discharged home.  The patient tolerated their visit well.  I saw the patient independently with physician available for consultation as needed.  The patient and / or the family were informed of the results of any tests, a time was given to answer questions, a plan was proposed and they agreed with plan.         I am the Primary Clinician of Record.    FINAL IMPRESSION      1. Cysts of both ovaries          DISPOSITION/PLAN     DISPOSITION Decision To Discharge 06/06/2023 03:20:01 PM      PATIENT REFERRED TO:  Edrick Oh, MD  [address] [employer]  [address]  [place] [address]  [phone]    Schedule an appointment as soon as possible for a visit       Julianne Handler, [place]  [address]  [place] [address]  [phone]    Schedule an appointment as soon as possible for a visit       [facility] Emergency Department  [address] [place] [address]  [phone]  Go to  As needed      DISCHARGE MEDICATIONS:  New Prescriptions    IBUPROFEN (IBU) 800 MG TABLET    Take 1 tablet by mouth every 8 hours as needed for Pain       DISCONTINUED MEDICATIONS:  Discontinued Medications    No medications on file              (Please note that portions of this note were completed with a voice  recognition program.  Efforts were made to edit the dictations but occasionally words are mis-transcribed.)    Tessie Fass, APRN - CNP (electronically signed)           Tessie Fass, APRN - CNP  06/06/23 1525

## 2023-06-06 NOTE — ED Notes (Signed)
Pain very tolerable  skin warm and dry  resting on left side which helps with pain

## 2023-06-06 NOTE — Discharge Instructions (Addendum)
Drive straight to [facility] emergency, do not stop do not eat.

## 2023-06-06 NOTE — ED Notes (Signed)
Pt left by private car to go to [facility]  Due to her lack of veins and her potential for needing more pain meds  I left her Saline Lock in place  she assures me she will drive directly to [facility] and she does not use or has ever used IV drugs  EMD was made and is agreeable to keeping iv in place  flush easily  Pain increases with walking and movement

## 2023-06-06 NOTE — ED Notes (Signed)
Dr Cliffton Asters  spoke to Dr Ezequiel Kayser at [facility] ED who will be assuming care of pt.  Pt will go to [place] by private car.

## 2023-06-06 NOTE — ED Provider Notes (Signed)
[place] [facility]      EMERGENCY MEDICINE     Pt Name: Katie Hopkins  MRN: [id number]  Birthdate 1996-01-06  Date of evaluation: 06/06/2023  Provider: Dennard Schaumann, MD    CHIEF COMPLAINT       Chief Complaint   Patient presents with    Flank Pain     Right side for 2 days  tearful moaning  states when she urinates feels pain in right side  worse this morning     HISTORY OF PRESENT ILLNESS   Katie Hopkins is a 27 y.o. female who presents to the emergency department for right flank pain over the last 2 days has been constant but occasionally having intermittent bursts of pain especially today.  Some pain with urination occasionally on the right side.  Family history of kidney stones.  No significant pelvic pain no discharge no vaginal bleeding.  No hematuria.      PASTMEDICAL HISTORY     Past Medical History:   Diagnosis Date    Hypertension        There is no problem list on file for this patient.    SURGICAL HISTORY     No past surgical history on file.    CURRENT MEDICATIONS       Previous Medications    AMLODIPINE (NORVASC) 5 MG TABLET    Take 1 tablet by mouth daily    LOSARTAN-HYDROCHLOROTHIAZIDE (HYZAAR) 50-12.5 MG PER TABLET    Take 1 tablet by mouth daily    TIRZEPATIDE-WEIGHT MANAGEMENT (ZEPBOUND) 2.5 MG/0.5ML SOAJ    Inject 2.5 mg into the skin once a week       ALLERGIES     is allergic to amoxicillin.    FAMILY HISTORY     She indicated that her mother is alive. She indicated that her father is alive. She indicated that the status of her maternal grandfather is unknown. She indicated that the status of her paternal grandfather is unknown. She indicated that the status of her other is unknown.       SOCIAL HISTORY       Social History     Tobacco Use    Smoking status: Every Day     Types: Cigarettes    Smokeless tobacco: Never    Tobacco comments:     2 packs per week   Vaping Use    Vaping Use: Never used   Substance Use Topics    Alcohol use: Yes     Comment:  Socially    Drug use: Yes     Frequency: 7.0 times per week     Types: Marijuana (Weed)       PHYSICAL EXAM       ED Triage Vitals   BP Temp Temp src Pulse Resp SpO2 Height Weight   -- -- -- -- -- -- -- --     Vitals:    06/06/23 1015   BP: (!) 142/90   Pulse: 88   Resp:    Temp:    SpO2: 100%         Physical Exam  Vitals and nursing note reviewed.   Constitutional:       General: She is in acute distress.      Appearance: She is obese. She is not ill-appearing, toxic-appearing or diaphoretic.   HENT:      Head: Normocephalic and atraumatic.      Right Ear: External ear normal.  Left Ear: External ear normal.      Nose: Nose normal.   Eyes:      Conjunctiva/sclera: Conjunctivae normal.   Cardiovascular:      Rate and Rhythm: Normal rate and regular rhythm.      Pulses: Normal pulses.   Pulmonary:      Effort: Pulmonary effort is normal. No respiratory distress.   Abdominal:      General: There is no distension.      Palpations: Abdomen is soft.      Tenderness: There is no abdominal tenderness. There is right CVA tenderness.   Skin:     General: Skin is warm and dry.      Capillary Refill: Capillary refill takes less than 2 seconds.   Neurological:      Mental Status: She is alert.           FORMAL DIAGNOSTIC RESULTS     EKG: All EKG's are interpreted by the Emergency Department Physician who either signs or Co-signs this chart in the absence of a cardiologist.      RADIOLOGY:   Non-plain film images such as CT, Ultrasound and MRI are read by the radiologist. Plainradiographic images are visualized and preliminarily interpreted by the  ED Provider with the belowfindings:        Interpretation per the Radiologist below, if available at the time of this note:    CT ABDOMEN PELVIS W IV CONTRAST Additional Contrast? None   Final Result   Large bilateral ovarian dermoid tumors.           No results found.   No results found.     LABS: (none if blank)  Labs Reviewed   COMPREHENSIVE METABOLIC PANEL W/ REFLEX TO MG FOR  LOW K - Abnormal; Notable for the following components:       Result Value    Glucose 114 (*)     All other components within normal limits   LIPASE - Abnormal; Notable for the following components:    Lipase 12.0 (*)     All other components within normal limits   URINALYSIS WITH REFLEX TO CULTURE   PREGNANCY, URINE   CBC WITH AUTO DIFFERENTIAL       (Any cultures that may have been sent were not resulted at the time of this patient visit)    MEDICAL DECISION MAKING / ED COURSE:     External Documentation Reviewed: Previous patient encounter documents & history available on EMR was reviewed:   Record from: Patient was seen on 10/09/2022 for recurrent headaches. .    Decision Rules/Clinical Scores utilized:               See Formal Diagnostic Results above for the lab and radiology tests and orders.    ED Reassessment:  See ED course    ED Course as of 06/06/23 1137   Sat Jun 06, 2023   1029 Nitrite, Urine: Negative [AL]   1029 Leukocyte Esterase, Urine: Negative [AL]   1037 Pregnancy, Urine: Negative [AL]   1042 CBC with Auto Differential  Delayed to machine malfunction will need to be sent by courier. [AL]   1059 Lipase(!): 12.0 [AL]   1059 Creatinine: 0.8 [AL]   1059 Est, Glom Filt Rate: >90 [AL]   1059 ALT: 12 [AL]   1059 AST: 16 [AL]   1128 CT ABDOMEN PELVIS W IV CONTRAST Additional Contrast? None  "IMPRESSION:  Large bilateral ovarian dermoid tumors.     " [AL]  ED Course User Index  [AL] Dennard Schaumann, MD       Is this patient to be included in the SEP-1 core measure? No Exclusion criteria - the patient is NOT to be included for SEP-1 Core Measure due to: 2+ SIRS criteria are not met     MDM Summary:  History was obtained from: Patient and chart      This is a 27 year old female comes in with right flank the last 2 days is hide to become more intermittently worse at times in the last day.  Some nausea, no hematuria no significant dysuria but some occasional right flank discomfort when urinating  she states.  She has family history of kidney stones.  No current pelvic pain or vaginal discharge vaginal bleeding    Initial suspicion is for possible kidney stone as she has a family history and was having costovertebral tenderness.  No signs of a kidney stone on her CT imaging and urine shows no signs of infection or blood.  She is having improvement in her right flank pain but occasionally radiates down her right pelvic region now.  With a CT scan showing bilateral dermoid tumors that are significant in size there is a component of possible intermittent torsion.  Patient has a history of being evaluated for this in the past and I feel at this time would benefit further evaluation with an ultrasound.  Due to her inability perform an ultrasound here she will be sent over to [facility] emergency room and to have her pelvis ultrasound performed for evaluation of possible ovarian torsion    She was advised to not stop or go home she prefers private transportation this time and her wife will drive her straight there.    I discussed this with the emergency department physician Dr. Ezequiel Kayser who has agreed to accept the patient over to his facility.  With this understanding.     Consults (none if blank):    ED physician at [facility]     Shared Decision-Making was performed and disposition discussed with the patient and their questions were answered     Social determinants of health impacting treatment or disposition:  None        Code Status:    Full code      Vitals Reviewed:    Vitals:    06/06/23 1004 06/06/23 1015   BP: (!) 149/99 (!) 142/90   Pulse: 90 88   Resp: 20    Temp: 98.6 F (37 C)    TempSrc: Oral    SpO2: 98% 100%   Weight: 88.9 kg (195 lb 15.8 oz)    Height: 1.524 m (5')        The patient was seen and examined.The results of pertinent diagnostic studies and exam findings were discussed. The patient's provisional diagnosis and plan of care were discussed with the patient  who expressed a complete  understanding. Any medications were reviewed and indications and risks of medications were discussed with the patient.     Strict verbal and written return precautions, instructions and appropriate follow-up were provided as well..     ED Medications administered this visit:  (None if blank)  Medications   morphine (PF) injection 4 mg (has no administration in time range)   ketorolac (TORADOL) injection 15 mg (15 mg IntraVENous Given 06/06/23 1014)   ondansetron (ZOFRAN) injection 4 mg (4 mg IntraVENous Given 06/06/23 1014)   iomeprol (IOMERON 350) 71.44 % injection 100 mL (100  mLs IntraVENous Given 06/06/23 1108)         Responses to treatment:       PROCEDURES: (None if blank)  Procedures:       CRITICAL CARE: (None if blank)        DISCHARGE PRESCRIPTIONS: (None if blank)  New Prescriptions    No medications on file         I am the primary clinician of record.     FINAL IMPRESSION      1. Flank pain    2. Dermoid tumor            DISPOSITION/PLAN   DISPOSITION        OUTPATIENT FOLLOW UP THE PATIENT:  No follow-up provider specified.      Electronically Signed: Dennard Schaumann, MD, 06/06/23, 11:37 AM    This report has been produced using speech recognition software and may contain errors related to that system including errors in grammar, punctuation, and spelling, as well as words and phrases that may be inappropriate. If there are any questions or concerns please feel free to contact the dictating provider for clarification.        Dennard Schaumann, MD  06/06/23 1137

## 2023-06-06 NOTE — ED Notes (Signed)
Report called to Amy RN

## 2023-06-06 NOTE — ED Notes (Signed)
Resting on right side.

## 2023-06-10 ENCOUNTER — Encounter: Admit: 2023-06-10 | Discharge: 2023-06-10 | Payer: PRIVATE HEALTH INSURANCE | Attending: Obstetrics & Gynecology

## 2023-06-10 DIAGNOSIS — Z01419 Encounter for gynecological examination (general) (routine) without abnormal findings: Secondary | ICD-10-CM

## 2023-06-10 NOTE — Progress Notes (Signed)
Subjective:      Patient ID: Katie Hopkins is a 27 y.o. female.    HPI  pts here for annual gyn exam.  She is following up from the ER for bilateral ov dermoids.  She moved to [place] from [place] a year ago.  Interested in surgery in Sept/Oct.    Review of Systems Pertinent review of systems items discussed above.  All others systems items not discussed above were negative.      Objective:   Physical Exam  Constitutional:       Appearance: She is well-developed.   HENT:      Head: Normocephalic and atraumatic.   Neck:      Thyroid: No thyromegaly.      Trachea: No tracheal deviation.   Cardiovascular:      Rate and Rhythm: Normal rate and regular rhythm.      Heart sounds: Normal heart sounds. No murmur heard.  Pulmonary:      Effort: Pulmonary effort is normal. No respiratory distress.      Breath sounds: Normal breath sounds. No wheezing or rales.   Chest:   Breasts:     Right: No mass, nipple discharge or skin change.      Left: No mass, nipple discharge or skin change.   Abdominal:      General: There is no distension.      Palpations: Abdomen is soft. There is no mass.      Tenderness: There is no abdominal tenderness. There is no rebound.   Genitourinary:     Labia:         Right: No lesion.         Left: No lesion.       Vagina: Normal. No foreign body. No vaginal discharge.      Cervix: No cervical motion tenderness, discharge or friability.      Uterus: Not deviated, not enlarged, not fixed and not tender.       Adnexa:         Right: Tenderness and fullness present. No mass.          Left: Tenderness and fullness present. No mass.        Rectum: Normal. No external hemorrhoid.      Comments: Pap performed.    Musculoskeletal:         General: Normal range of motion.   Lymphadenopathy:      Cervical: No cervical adenopathy.   Neurological:      Mental Status: She is alert and oriented to person, place, and time.         Assessment:   Normal gyn exam, bilateral ov dermoids     Plan:   Advised pt  to call for pre op appt 2-3 weeks before she is interested in having surgery.  I briefly discussed surgery- length, hosp stay, recov.  Call with results.         Edrick Oh, MD

## 2023-06-17 LAB — HUMAN PAPILLOMAVIRUS (HPV) DNA PROBE THIN PREP HIGH RISK
HPV Genotype 16: NOT DETECTED
HPV Type 18: NOT DETECTED
HPVOH (Other Types): DETECTED — AB

## 2023-06-18 NOTE — Telephone Encounter (Signed)
Pt would like a return call with her results. Please give pt a call [phone]

## 2023-06-18 NOTE — Telephone Encounter (Signed)
Patient saw her pap smear results on her MyChart. She said she will until Dr. Emerson Monte comes back to review the results and will talk more about it then.

## 2023-06-25 ENCOUNTER — Encounter

## 2023-06-25 MED ORDER — AMLODIPINE BESYLATE 5 MG PO TABS
5 | ORAL_TABLET | Freq: Every day | ORAL | 1 refills | Status: DC
Start: 2023-06-25 — End: 2024-06-20

## 2023-06-27 ENCOUNTER — Encounter

## 2023-06-29 MED ORDER — LOSARTAN POTASSIUM-HCTZ 50-12.5 MG PO TABS
50-12.5 | ORAL_TABLET | Freq: Every day | ORAL | 1 refills | 60.00000 days | Status: DC
Start: 2023-06-29 — End: 2024-06-02

## 2023-07-01 ENCOUNTER — Encounter: Admit: 2023-07-01 | Discharge: 2023-07-01 | Payer: PRIVATE HEALTH INSURANCE | Attending: Obstetrics & Gynecology

## 2023-07-01 DIAGNOSIS — R87611 Atypical squamous cells cannot exclude high grade squamous intraepithelial lesion on cytologic smear of cervix (ASC-H): Secondary | ICD-10-CM

## 2023-07-01 NOTE — Progress Notes (Signed)
Colposcopy Procedure Note    Indications: Pap smear 1 months ago showed: ASC cannot exclude high grade lesion [facility]). The prior pap showed no abnormalities.  Prior cervical/vaginal disease: normal exam without visible pathology. Prior cervical treatment: no treatment.    Procedure Details   The risks and benefits of the procedure and Written informed consent obtained.    Speculum placed in vagina and excellent visualization of cervix achieved, cervix swabbed x 3 with acetic acid solution.    Findings:  Cervix: acetowhite lesion(s) noted at 12 o'clock; SCJ visualized - lesion at 12 o'clock, endocervical curettage performed, cervical biopsies taken at 12 o'clock, specimen labelled and sent to pathology, and hemostasis achieved with Monsel's solution.  Vaginal inspection: normal without visible lesions.  Vulvar colposcopy: normal mucosa without lesions.    Specimens: 1200, ecc    Complications: none.    Plan:  Specimens labelled and sent to Pathology.  Will base further treatment on Pathology findings.  Treatment options discussed with patient.  Post biopsy instructions given to patient.

## 2023-12-04 ENCOUNTER — Ambulatory Visit: Admit: 2023-12-04 | Discharge: 2023-12-04 | Payer: PRIVATE HEALTH INSURANCE | Attending: Obstetrics & Gynecology

## 2023-12-04 ENCOUNTER — Encounter

## 2023-12-04 ENCOUNTER — Ambulatory Visit: Admit: 2023-12-04 | Discharge: 2023-12-04 | Payer: PRIVATE HEALTH INSURANCE

## 2023-12-04 VITALS — BP 200/110 | HR 64 | Resp 24 | Ht 60.0 in | Wt 224.0 lb

## 2023-12-04 VITALS — BP 138/88 | Ht 60.0 in | Wt 225.0 lb

## 2023-12-04 DIAGNOSIS — N83201 Unspecified ovarian cyst, right side: Secondary | ICD-10-CM

## 2023-12-04 DIAGNOSIS — Z0000 Encounter for general adult medical examination without abnormal findings: Secondary | ICD-10-CM

## 2023-12-04 NOTE — Progress Notes (Signed)
SUBJECTIVE/OBJECTIVE:  Katie Hopkins (DOB:  1996-08-25) is a 28 y.o. female here for   Chief Complaint: Annual Exam      HPI  History of Present Illness  The patient is a 28 year old female who presents today for an annual physical exam.    HTN--She has not been adhering to her prescribed blood pressure medication regimen, believing she has been managing her blood pressure without medication. She was previously on daily amlodipine and Hyzaar.  She discontinued her medications between October 2024 and November 2024. She was monitoring her blood pressure at home for a time did not give me exact readings but believes they were less than 180 systolic.  She reports no current chest pain, respiratory distress, dizziness, lower extremity edema, syncope, or severe headaches.     Ovarian Cyst--She has persistent ovarian cysts and is scheduled for surgery on 12/31/2023. She was at the gynecologist this morning, and her blood pressure was controlled.  Surgery was previously postponed 2/2 uncontrolled htn.    Health Maintenance:  Complete labs today.  Age-appropriate vaccinations discussed with patient.  She is uncertain about her varicella vaccination status. She has declined the influenza vaccine. 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       Review of Systems   Constitutional:  Negative for chills and fever.   Eyes:  Negative for visual disturbance.   Respiratory:  Negative for cough, chest tightness, shortness of breath and wheezing.    Cardiovascular:  Negative for chest pain, palpitations and leg swelling.   Neurological:  Negative for dizziness, syncope, speech difficulty, light-headedness and headaches.        Vitals:    12/04/23 1116   BP: (!) 200/110   Pulse:    Resp:    SpO2:         Physical Exam  Constitutional:       General: She is not in acute distress.     Appearance: Normal appearance. She is not ill-appearing.   HENT:      Head: Normocephalic  and atraumatic.   Eyes:      Extraocular Movements: Extraocular movements intact.   Neck:      Thyroid: No thyroid mass.   Cardiovascular:      Rate and Rhythm: Normal rate and regular rhythm.      Pulses: Normal pulses.      Heart sounds: No murmur heard.     No friction rub. No gallop.   Pulmonary:      Effort: Pulmonary effort is normal.      Breath sounds: Normal breath sounds. No wheezing, rhonchi or rales.   Abdominal:      Tenderness: There is abdominal tenderness.   Musculoskeletal:      Cervical back: Neck supple. No tenderness.      Right lower leg: No edema.      Left lower leg: No edema.   Lymphadenopathy:      Cervical: No cervical adenopathy.   Skin:     General: Skin is warm and dry.   Neurological:      Mental Status: She is alert and oriented to person, place, and time.   Psychiatric:         Thought Content: Thought content normal.         Judgment: Judgment normal.       Physical Exam    Vital Signs  Blood pressure is significantly elevated on initial  check and repeat check in office today.      ASSESSMENT/PLAN:  1. Annual physical exam  -     Comprehensive Metabolic Panel; Future  -     TSH reflex to FT4; Future  -     Lipid Panel; Future  -     HIV Screen; Future  -     Hepatitis C Antibody; Future  -     VARICELLA ZOSTER ANTIBODY, IGG; Future  -     Hepatitis B Surface Antibody; Future  2. Primary hypertension     Assessment & Plan  Annual Physical Exam  Detailed with maintenance discussion above.  Complete labs today.      HTN  Her blood pressure readings have been consistently elevated, with systolic values exceeding 200 and diastolic values around 140. Despite this, she reports no current symptoms such as chest pain, trouble breathing, dizziness, swelling in her legs, passing out, or severe headaches.  Potential risks of uncontrolled hypertension, including organ damage, strokes, and bleeds, discussed with patient today.   Restart Hyzaar and Amlodipine as these previously controlled BP in  the past.  Monitor pressures at home.  Red flag symptoms discussed with patient and she should present to the emergency department if she experiences these.  Otherwise, follow-up in 2 weeks for repeat blood pressure and kidney function check        Return in about 2 weeks (around 12/18/2023) for BP follow up. OR sooner with questions, concerns, worsening symptoms  Patient verbalized understanding and agreement to plan.      The patient (or guardian, if applicable) and other individuals in attendance with the patient were advised that Artificial Intelligence will be utilized during this visit to record, process the conversation to generate a clinical note, and support improvement of the AI technology. The patient (or guardian, if applicable) and other individuals in attendance at the appointment consented to the use of AI, including the recording.      Documentation was done using voice recognition dragon software. Every effort was made to ensure accuracy; however, inadvertent unintentional computerized transcription errors may be present.     An electronic signature was used to authenticate this note.      --Julianne Handler, PA

## 2023-12-04 NOTE — Telephone Encounter (Signed)
[facility] called stating pt had her pre-op at her PC, pts BP was 222/118 while at their visit.    Discussed with [facility] that pt's blood pressure was 138/88 here at Dr [facility] office.  Pt stated she hasn't taken her medication for a while. Advised pt to take her medication as directed.    [facility] informed me that pt is to return for bp check in two weeks after taking her medication.  They wanted to let Dr Emerson Monte know that pt will not be able to be cleared for surgery until bp is under control.

## 2023-12-04 NOTE — H&P (Signed)
HISTORY AND PHYSICAL             Date: 12/04/2023        Patient Name: Katie Hopkins     Date of Birth: December 10, 1995      Age:  28 y.o.    Chief Complaint     Chief Complaint   Patient presents with    Pre-op Exam     Ovarian cyst removal  Last pap 7/25 abnormal      Bilateral dermoids    History Obtained From   patient    History of Present Illness   G1P1 presents with bilateral dermoids.  [place] right dermoid 7 cm and left dermoid 6 cm.  Denies other complaints.    Past Medical History     Past Medical History:   Diagnosis Date    Hypertension         Past Surgical History   No past surgical history on file.     Medications Prior to Admission     Prior to Admission medications    Medication Sig Start Date End Date Taking? Authorizing Provider   losartan-hydroCHLOROthiazide (HYZAAR) 50-12.5 MG per tablet TAKE 1 TABLET BY MOUTH DAILY 06/29/23   Klopp, [place], PA   amLODIPine (NORVASC) 5 MG tablet TAKE 1 TABLET BY MOUTH DAILY 06/25/23   Klopp, [place], PA   ibuprofen (IBU) 800 MG tablet Take 1 tablet by mouth every 8 hours as needed for Pain 06/06/23 06/16/23  Tessie Fass, APRN - CNP   Tirzepatide-Weight Management (ZEPBOUND) 2.5 MG/0.5ML SOAJ Inject 2.5 mg into the skin once a week  Patient not taking: Reported on 06/10/2023 05/07/23   Klopp, [place], PA        Allergies   Amoxicillin    Social History     Social History       Tobacco History       Smoking Status  Every Day Smoking Tobacco Type  Cigarettes   Pack Year History     Packs/Day From To Years       3.0      Smokeless Tobacco Use  Never      Tobacco Comments  2 packs per week              Alcohol History       Alcohol Use Status  Yes Comment  Socially              Drug Use       Drug Use Status  Yes Types  Marijuana (Weed) Frequency   7 times/week              Sexual Activity       Sexually Active  Yes Partners  Female                  Stopped drinking and stopped tobacco use, still smokes MJ  Family History     Family History   Problem Relation Age of Onset     High Cholesterol Father     Diabetes Father     Hypertension Father     Cancer Maternal Grandfather         Prostate    Heart Attack Paternal Grandfather     High Cholesterol Other     Diabetes Other     Hypertension Other        Review of Systems   Review of Systems  Pertinent review of systems items discussed above.  All others systems items not discussed  above were negative.    Physical Exam   BP 138/88   Ht 1.524 m (5')   Wt 102.1 kg (225 lb)   LMP 11/26/2023   BMI 43.94 kg/m     Physical Exam    CV- RRR  Resp- bil CTA    Labs    No results found for this or any previous visit (from the past 24 hour(s)).     Imaging/Diagnostics Last 24 Hours   No results found.    Assessment      Bilateral ovarian dermoids  Plan   Recommend laparotomy with bilateral resection of ovarian dermoids.  I discussed the technique, recovery, and risks with patient.  They include but are not limited to bleeding, infection, damage to internal organs (e.g., bladder, bowel, ureters).  Patient voiced understanding and agrees to proceed.  Pt also is overdue for f/u pap smears- was due for pap in November, which she is aware.  Advised to make f/u office appt for pap.  35 min >50% of time was spent discussing findings, management, and treatment options.      Consultations Ordered:  None    Electronically signed by Edrick Oh, MD on 12/04/23 at 8:43 AM EST

## 2023-12-05 LAB — HEPATITIS B SURFACE ANTIBODY: Hep B S Ab: <3.5 m[IU]/mL

## 2023-12-05 LAB — COMPREHENSIVE METABOLIC PANEL
ALT: 23 U/L (ref 10–40)
AST: 23 U/L (ref 15–37)
Albumin/Globulin Ratio: 1.5 (ref 1.1–2.2)
Albumin: 4.4 g/dL (ref 3.4–5.0)
Alkaline Phosphatase: 83 U/L (ref 40–129)
Anion Gap: 10 (ref 3–16)
BUN: 6 mg/dL — ABNORMAL LOW (ref 7–20)
CO2: 27 mmol/L (ref 21–32)
Calcium: 9.5 mg/dL (ref 8.3–10.6)
Chloride: 103 mmol/L (ref 99–110)
Creatinine: 0.7 mg/dL (ref 0.6–1.1)
Est, Glom Filt Rate: >90 (ref >60)
Glucose: 71 mg/dL (ref 70–99)
Potassium: 4.6 mmol/L (ref 3.5–5.1)
Sodium: 140 mmol/L (ref 136–145)
Total Bilirubin: <0.2 mg/dL (ref 0.0–1.0)
Total Protein: 7.3 g/dL (ref 6.4–8.2)

## 2023-12-05 LAB — HEPATITIS C ANTIBODY: Hep C Ab Interp: NONREACTIVE

## 2023-12-05 LAB — LIPID PANEL
Cholesterol, Total: 214 mg/dL — ABNORMAL HIGH (ref 0–199)
HDL: 67 mg/dL — ABNORMAL HIGH (ref 40–60)
LDL Cholesterol: 127 mg/dL — ABNORMAL HIGH (ref <100)
Triglycerides: 102 mg/dL (ref 0–150)
VLDL Cholesterol Calculated: 20 mg/dL

## 2023-12-05 LAB — VARICELLA ZOSTER ANTIBODY, IGG: Varicella-Zoster Virus Ab, Igg: NON-IMMUNE/NOT IMMUNE

## 2023-12-05 LAB — HIV SCREEN
HIV ANTIGEN: NONREACTIVE
HIV Ag/Ab: NONREACTIVE
HIV-1 Antibody: NONREACTIVE
HIV-2 Ab: NONREACTIVE

## 2023-12-05 LAB — TSH REFLEX TO FT4: TSH Reflex FT4: 1.27 u[IU]/mL (ref 0.27–4.20)

## 2023-12-08 NOTE — Telephone Encounter (Signed)
Patient was called back. She wants to have her Gyn surgery April 24th. Feb. 20 surgery was cancelled. Pre-op visit made on April 7th.

## 2023-12-08 NOTE — Telephone Encounter (Signed)
Patient called requesting a return call to reschedule her surgery with Dr Emerson Monte

## 2023-12-18 ENCOUNTER — Encounter

## 2023-12-18 ENCOUNTER — Ambulatory Visit: Admit: 2023-12-18 | Discharge: 2023-12-18 | Payer: PRIVATE HEALTH INSURANCE

## 2023-12-18 VITALS — BP 122/82 | HR 104 | Resp 26 | Ht 60.0 in | Wt 212.0 lb

## 2023-12-18 DIAGNOSIS — I10 Essential (primary) hypertension: Secondary | ICD-10-CM

## 2023-12-18 NOTE — Progress Notes (Signed)
SUBJECTIVE/OBJECTIVE:  Katie Hopkins (DOB:  16-Dec-1995) is a 28 y.o. female here for   Chief Complaint: 2 Week Follow-Up (BP)      HPI  History of Present Illness  The patient is a 28 year old female who is here today for a blood pressure follow-up.    She has been managing her blood pressure with losartan/hydrochlorothiazide and amlodipine 5 mg, both of which she tolerates well without any adverse effects. She does not monitor her blood pressure at home. She reports no chest pain, respiratory distress, lower extremity edema, vertigo, or severe cephalgia.      Review of Systems   Constitutional:  Negative for chills and fever.   Eyes:  Negative for visual disturbance.   Respiratory:  Negative for cough, chest tightness, shortness of breath and wheezing.    Cardiovascular:  Negative for chest pain, palpitations and leg swelling.   Neurological:  Negative for dizziness, syncope, speech difficulty, light-headedness and headaches.        Vitals:    12/18/23 1124   BP:    Pulse: (!) 104   Resp:    SpO2:         Physical Exam  Constitutional:       General: She is not in acute distress.     Appearance: Normal appearance. She is not ill-appearing.   HENT:      Head: Normocephalic and atraumatic.   Eyes:      Extraocular Movements: Extraocular movements intact.   Neck:      Thyroid: No thyroid mass.      Vascular: No carotid bruit.   Cardiovascular:      Rate and Rhythm: Normal rate and regular rhythm.      Pulses: Normal pulses.      Heart sounds: No murmur heard.     No friction rub. No gallop.   Pulmonary:      Effort: Pulmonary effort is normal.      Breath sounds: Normal breath sounds. No wheezing, rhonchi or rales.   Musculoskeletal:      Right lower leg: No edema.      Left lower leg: No edema.   Skin:     General: Skin is warm and dry.   Neurological:      Mental Status: She is alert and oriented to person, place, and time.   Psychiatric:         Thought Content: Thought content normal.         Judgment:  Judgment normal.           ASSESSMENT/PLAN:  1. Primary hypertension  -     Basic Metabolic Panel; Future     Assessment & Plan  Hypertension  - Blood pressure controlled on resumption of previous regimen.  - Continue current regimen of amlodipine 5 mg and losartan/hydrochlorothiazide  - Repeat kidney function test today   - Monitor blood pressure and heart rate at home using a blood pressure cuff    Health Maintenance  - Receive tetanus shot today        Return in about 6 months (around 06/16/2024) for BP follow up. OR sooner with questions, concerns, worsening symptoms  Patient verbalized understanding and agreement to plan.      The patient (or guardian, if applicable) and other individuals in attendance with the patient were advised that Artificial Intelligence will be utilized during this visit to record, process the conversation to generate a clinical note, and support improvement of the AI technology. The patient (  or guardian, if applicable) and other individuals in attendance at the appointment consented to the use of AI, including the recording.      Documentation was done using voice recognition dragon software. Every effort was made to ensure accuracy; however, inadvertent unintentional computerized transcription errors may be present.     An electronic signature was used to authenticate this note.      --Julianne Handler, PA

## 2023-12-19 LAB — BASIC METABOLIC PANEL
Anion Gap: 11 (ref 3–16)
BUN: 8 mg/dL (ref 7–20)
CO2: 32 mmol/L (ref 21–32)
Calcium: 10.3 mg/dL (ref 8.3–10.6)
Chloride: 95 mmol/L — ABNORMAL LOW (ref 99–110)
Creatinine: 0.9 mg/dL (ref 0.6–1.1)
Est, Glom Filt Rate: 89 (ref >60)
Glucose: 88 mg/dL (ref 70–99)
Potassium: 3.5 mmol/L (ref 3.5–5.1)
Sodium: 138 mmol/L (ref 136–145)

## 2024-02-15 ENCOUNTER — Ambulatory Visit: Admit: 2024-02-15 | Discharge: 2024-02-15 | Payer: PRIVATE HEALTH INSURANCE | Attending: Obstetrics & Gynecology

## 2024-02-15 VITALS — BP 138/88 | Wt 210.0 lb

## 2024-02-15 DIAGNOSIS — D270 Benign neoplasm of right ovary: Secondary | ICD-10-CM

## 2024-02-15 NOTE — Addendum Note (Signed)
 Addended by: Karlene Einstein on: 02/15/2024 10:52 AM     Modules accepted: Orders

## 2024-02-15 NOTE — H&P (Signed)
 HISTORY AND PHYSICAL             Date: 02/15/2024        Patient Name: Katie Hopkins     Date of Birth: 12/06/1995      Age:  28 y.o.    Chief Complaint     Chief Complaint   Patient presents with    Pre-op Exam     LAPAROTOMY RESECTION OF BILATERAL DERMOIDS      Bilateral ov dermoids    History Obtained From   patient    History of Present Illness   G1P1 presents with bilateral ovarian dermoids.  US showed the right measured 7.8 cm and the left was 6.7 cm.    Past Medical History     Past Medical History:   Diagnosis Date    Hypertension         Past Surgical History   No past surgical history on file.   1 svd    Medications Prior to Admission     Prior to Admission medications    Medication Sig Start Date End Date Taking? Authorizing Provider   losartan-hydroCHLOROthiazide (HYZAAR) 50-12.5 MG per tablet TAKE 1 TABLET BY MOUTH DAILY 06/29/23  Yes Klopp, [place] Citizen, PA   amLODIPine (NORVASC) 5 MG tablet TAKE 1 TABLET BY MOUTH DAILY 06/25/23  Yes Klopp, [place] Citizen, PA   ibuprofen (IBU) 800 MG tablet Take 1 tablet by mouth every 8 hours as needed for Pain 06/06/23 06/16/23  Tessie Fass, APRN - CNP        Allergies   Amoxicillin    Social History     Social History       Tobacco History       Smoking Status  Every Day Smoking Tobacco Type  Cigarettes   Pack Year History     Packs/Day From To Years       3.0      Smokeless Tobacco Use  Never      Tobacco Comments  2 packs per week              Alcohol History       Alcohol Use Status  Yes Comment  Socially              Drug Use       Drug Use Status  Yes Types  Marijuana (Weed) Frequency   7 times/week              Sexual Activity       Sexually Active  Yes Partners  Female                Pt stopped smoking tobacco but smokes mj  No alcohol since Dec.  Family History     Family History   Problem Relation Age of Onset    High Cholesterol Father     Diabetes Father     Hypertension Father     Cancer Maternal Grandfather         Prostate    Heart Attack Paternal Grandfather      High Cholesterol Other     Diabetes Other     Hypertension Other        Review of Systems   Review of Systems  Pertinent review of systems items discussed above.  All others systems items not discussed above were negative.    Physical Exam   BP 138/88   Wt 95.3 kg (210 lb)   LMP 02/11/2024 (Exact Date)   BMI  41.01 kg/m     Physical Exam    CV- RRR  Resp- bil CTA    Labs    No results found for this or any previous visit (from the past 24 hours).     Imaging/Diagnostics Last 24 Hours   No results found.    Assessment    bilateral ovarian dermoids    Plan   Recommend laparotomy with resection of bilateral dermoids.  I discussed the technique, recovery, and risks with patient.  They include but are not limited to bleeding, infection, damage to internal organs (e.g., bladder, bowel, ureters).  Patient voiced understanding and agrees to proceed.  35 min   >50% of time was spent discussing findings, management, and treatment options.      Consultations Ordered:  None    Electronically signed by Edrick Oh, MD on 02/15/24 at 10:14 AM EDT

## 2024-02-18 NOTE — Progress Notes (Signed)
 [facility] PRE-OPERATIVE INSTRUCTIONS    Day of Procedure:  03/10/24              Arrival time:     0600           Surgery time:0730    Take the following medications with a sip of water:  Follow your MD/Surgeons pre-procedure instructions regarding your medications     Do not eat or drink anything after 12:00 midnight prior to your surgery.  This includes water, chewing gum, mints and ice chips.   You may brush your teeth and gargle the morning of your surgery, but do not swallow the water.     Please see your family doctor/pediatrician for a history and physical and/or concerning medications.   Bring any test results/reports from your physicians office.   If you are under the care of a heart doctor or specialist doctor, please be aware that you may be asked to see them for clearance.    You may be asked to stop blood thinners such as Coumadin, Plavix, Fragmin, Lovenox, etc., or any anti-inflammatories such as:  Aspirin, Ibuprofen, Advil, Naproxen prior to your surgery.    We also ask that you stop any over the counter medications such as fish oil, vitamin E, glucosamine, garlic, Multivitamins, COQ 10, etc.    We ask that you do not smoke 24 hours prior to surgery.  We ask that you do not  drink any alcoholic beverages 24 hours prior to surgery     You must make arrangements for a responsible adult to take you home after your surgery.    For your safety, you will not be allowed to leave alone or drive yourself home.  Your surgery will be cancelled if you do not have a ride home.     Also for your safety, you must have someone stay with you the first 24 hours after your surgery.     A parent or legal guardian must accompany a child scheduled for surgery and plan to stay at the hospital until the child is discharged.    Please do not bring other children with you.    For your comfort, please wear simple loose fitting clothing to the hospital.  Please do not bring valuables.    Do not wear any make-up on  face or nail polish on your fingers or toes.      For your safety, please do not wear any jewelry or body piercing's on the day of surgery.   All jewelry must be removed.      If you have dentures, they will be removed before going to operating room.    For your convenience, we will provide you with a container.    If you wear contact lenses or glasses, they will be removed, please bring a case for them.     If you have a living will and a durable power of attorney for healthcare, please bring in a copy.     As part of our patient safety program to minimize surgical site infections, we ask you to do the following:    Please notify your surgeon if you develop any illness between         now and the  day of your surgery.    This includes a cough, cold, fever, sore throat, nausea,         or vomiting, and diarrhea, etc.   Please notify your surgeon if you experience dizziness,  shortness         of breath or blurred vision between now and the time of your surgery.      Do not shave your operative site 96 hours prior to surgery.   For face and neck surgery, men may use an electric razor 48 hours   prior to surgery.    You must shower the night before surgery and the morning of   your surgery with an antibacterial soap.    You will need to bring a photo ID and insurance card.     [facility] has an Medical laboratory scientific officer, would you like to utilize our pharmacy.     If you will be staying overnight and use a C-pap machine, please bring   your C-pap to hospital.     Our goal is to provide you with excellent care, therefore, visitors will be limited to two in the room at a time so that we may focus on providing this care for you.          Please contact pre-admission testing if you have any further questions.                 [facility] phone number:  [phone]     [facility] PAT fax number:  [phone]  Please note these are generalized instructions for all surgical cases, you may be provided with more specific instructions according to  your surgery.    C-Difficile admission screening and protocol:       * Admitted with diarrhea?                         []  YES    [x]   NO     *Prior history of C-Diff. In last 3 months? []  YES    [x]   NO     *Antibiotic use in the past 6-8 weeks?      []   NO    [x]   YES                 If yes, which ANTIBIOTIC AND REASON__oral abcess____     *Prior hospitalization or nursing home in the last month? []   YES    [x]   NO        SAFETY FIRST...call before you fall!!!

## 2024-03-07 NOTE — Telephone Encounter (Signed)
 Lvm for employer representative to call me back with the fax number to fax Medical accommodation request forms back too.

## 2024-03-08 NOTE — Telephone Encounter (Addendum)
 Patient called w/Fax Number that forms need to be sent to for upcoming surgery.    [phone]

## 2024-03-08 NOTE — Telephone Encounter (Addendum)
 Employer called back and verified the Fax Number as [phone] for Medical Accommodation request forms.

## 2024-03-08 NOTE — Telephone Encounter (Signed)
 Forms faxed

## 2024-03-10 ENCOUNTER — Inpatient Hospital Stay
Admit: 2024-03-10 | Discharge: 2024-03-11 | Discharge status: Against Medical Advice | Payer: Medicaid (Managed Care) | Admitting: Obstetrics & Gynecology

## 2024-03-10 DIAGNOSIS — D270 Benign neoplasm of right ovary: Secondary | ICD-10-CM

## 2024-03-10 DIAGNOSIS — D271 Benign neoplasm of left ovary: Principal | ICD-10-CM

## 2024-03-10 LAB — POC PREGNANCY UR-QUAL: Pregnancy, Urine: NEGATIVE

## 2024-03-10 MED ORDER — LOSARTAN POTASSIUM 50 MG PO TABS
50 MG | Freq: Every day | ORAL | Status: DC
Start: 2024-03-10 — End: 2024-03-11
  Administered 2024-03-11: 14:00:00 50 mg via ORAL

## 2024-03-10 MED ORDER — ACETAMINOPHEN 500 MG PO TABS
500 MG | Freq: Three times a day (TID) | ORAL | Status: DC
Start: 2024-03-10 — End: 2024-03-11
  Administered 2024-03-10 – 2024-03-11 (×3): 1000 mg via ORAL

## 2024-03-10 MED ORDER — NORMAL SALINE FLUSH 0.9 % IV SOLN
0.9 | Freq: Two times a day (BID) | INTRAVENOUS | Status: DC
Start: 2024-03-10 — End: 2024-03-10

## 2024-03-10 MED ORDER — FENTANYL CITRATE (PF) 100 MCG/2ML IJ SOLN
100 | INTRAMUSCULAR | Status: AC
Start: 2024-03-10 — End: ?

## 2024-03-10 MED ORDER — MEPERIDINE HCL 25 MG/ML IJ SOLN
25 | Freq: Once | INTRAMUSCULAR | Status: DC | PRN
Start: 2024-03-10 — End: 2024-03-10

## 2024-03-10 MED ORDER — AMLODIPINE BESYLATE 5 MG PO TABS
5 MG | Freq: Every day | ORAL | Status: DC
Start: 2024-03-10 — End: 2024-03-11
  Administered 2024-03-11: 14:00:00 5 mg via ORAL

## 2024-03-10 MED ORDER — CEFAZOLIN SODIUM 2 G SOLR (MIXTURES ONLY)
2 | INTRAVENOUS | Status: AC
Start: 2024-03-10 — End: 2024-03-10
  Administered 2024-03-10: 12:00:00 2000 mg via INTRAVENOUS

## 2024-03-10 MED ORDER — NORMAL SALINE FLUSH 0.9 % IV SOLN
0.9 % | Freq: Two times a day (BID) | INTRAVENOUS | Status: DC
Start: 2024-03-10 — End: 2024-03-11
  Administered 2024-03-11: 14:00:00 10 mL via INTRAVENOUS

## 2024-03-10 MED ORDER — HYDROMORPHONE HCL 1 MG/ML IJ SOLN
1 | INTRAMUSCULAR | Status: AC
Start: 2024-03-10 — End: ?

## 2024-03-10 MED ORDER — DEXAMETHASONE SODIUM PHOSPHATE 20 MG/5ML IJ SOLN
20 | Freq: Once | INTRAMUSCULAR | Status: DC | PRN
Start: 2024-03-10 — End: 2024-03-10
  Administered 2024-03-10: 12:00:00 8 via INTRAVENOUS

## 2024-03-10 MED ORDER — NORMAL SALINE FLUSH 0.9 % IV SOLN
0.9 | INTRAVENOUS | Status: DC | PRN
Start: 2024-03-10 — End: 2024-03-10

## 2024-03-10 MED ORDER — SODIUM CHLORIDE (PF) 0.9 % IJ SOLN
0.9 % | INTRAMUSCULAR | Status: DC | PRN
Start: 2024-03-10 — End: 2024-03-11

## 2024-03-10 MED ORDER — NALOXONE HCL 0.4 MG/ML IJ SOLN
0.4 | INTRAMUSCULAR | Status: DC | PRN
Start: 2024-03-10 — End: 2024-03-10

## 2024-03-10 MED ORDER — SODIUM CHLORIDE 0.9 % IV SOLN
0.9 | INTRAVENOUS | Status: DC | PRN
Start: 2024-03-10 — End: 2024-03-10

## 2024-03-10 MED ORDER — SODIUM CHLORIDE 0.9 % IV SOLN
0.9 | INTRAVENOUS | Status: DC | PRN
Start: 2024-03-10 — End: 2024-03-10
  Administered 2024-03-10 (×2): via INTRAVENOUS

## 2024-03-10 MED ORDER — ONDANSETRON 4 MG PO TBDP
4 MG | Freq: Three times a day (TID) | ORAL | Status: DC | PRN
Start: 2024-03-10 — End: 2024-03-11

## 2024-03-10 MED ORDER — LIDOCAINE HCL (PF) 2 % IJ SOLN
2 | INTRAMUSCULAR | Status: AC
Start: 2024-03-10 — End: ?

## 2024-03-10 MED ORDER — FENTANYL CITRATE (PF) 100 MCG/2ML IJ SOLN
100 | INTRAMUSCULAR | Status: DC | PRN
Start: 2024-03-10 — End: 2024-03-10

## 2024-03-10 MED ORDER — MIDAZOLAM HCL 2 MG/2ML IJ SOLN
2 | Freq: Once | INTRAMUSCULAR | Status: DC | PRN
Start: 2024-03-10 — End: 2024-03-10
  Administered 2024-03-10: 12:00:00 2 via INTRAVENOUS

## 2024-03-10 MED ORDER — NORMAL SALINE FLUSH 0.9 % IV SOLN
0.9 % | INTRAVENOUS | Status: DC | PRN
Start: 2024-03-10 — End: 2024-03-11

## 2024-03-10 MED ORDER — DEXAMETHASONE SODIUM PHOSPHATE 20 MG/5ML IJ SOLN
20 | INTRAMUSCULAR | Status: AC
Start: 2024-03-10 — End: ?

## 2024-03-10 MED ORDER — LACTATED RINGERS IV SOLN
INTRAVENOUS | Status: DC
Start: 2024-03-10 — End: 2024-03-10

## 2024-03-10 MED ORDER — LACTATED RINGERS IV SOLN
INTRAVENOUS | Status: DC
Start: 2024-03-10 — End: 2024-03-11
  Administered 2024-03-11: 06:00:00 via INTRAVENOUS

## 2024-03-10 MED ORDER — IBUPROFEN 400 MG PO TABS
400 MG | Freq: Three times a day (TID) | ORAL | Status: DC
Start: 2024-03-10 — End: 2024-03-11

## 2024-03-10 MED ORDER — HYDROMORPHONE HCL 1 MG/ML IJ SOLN
1 | Freq: Once | INTRAMUSCULAR | Status: DC | PRN
Start: 2024-03-10 — End: 2024-03-10
  Administered 2024-03-10 (×2): .5 via INTRAVENOUS

## 2024-03-10 MED ORDER — SODIUM CHLORIDE 0.9 % IV SOLN
0.9 % | INTRAVENOUS | Status: DC | PRN
Start: 2024-03-10 — End: 2024-03-11

## 2024-03-10 MED ORDER — ONDANSETRON HCL 4 MG/2ML IJ SOLN
4 | INTRAMUSCULAR | Status: AC
Start: 2024-03-10 — End: ?

## 2024-03-10 MED ORDER — KETOROLAC TROMETHAMINE 30 MG/ML IJ SOLN
30 | Freq: Four times a day (QID) | INTRAMUSCULAR | Status: AC
Start: 2024-03-10 — End: 2024-03-11
  Administered 2024-03-10 – 2024-03-11 (×3): 30 mg via INTRAVENOUS

## 2024-03-10 MED ORDER — MIDAZOLAM HCL 2 MG/2ML IJ SOLN
2 | INTRAMUSCULAR | Status: AC
Start: 2024-03-10 — End: ?

## 2024-03-10 MED ORDER — ONDANSETRON HCL 4 MG/2ML IJ SOLN
4 | Freq: Once | INTRAMUSCULAR | Status: DC | PRN
Start: 2024-03-10 — End: 2024-03-10

## 2024-03-10 MED ORDER — MORPHINE SULFATE 1 MG/ML PCA (DISCRETE DOSING)
1 MG/ML | INTRAVENOUS | Status: DC
Start: 2024-03-10 — End: 2024-03-11
  Administered 2024-03-11: 09:00:00 via INTRAVENOUS

## 2024-03-10 MED ORDER — ESMOLOL HCL 100 MG/10ML IV SOLN
100 | Freq: Once | INTRAVENOUS | Status: DC | PRN
Start: 2024-03-10 — End: 2024-03-10
  Administered 2024-03-10 (×2): 20 via INTRAVENOUS

## 2024-03-10 MED ORDER — PROPOFOL 200 MG/20ML IV EMUL
200 | Freq: Once | INTRAVENOUS | Status: DC | PRN
Start: 2024-03-10 — End: 2024-03-10
  Administered 2024-03-10: 12:00:00 200 via INTRAVENOUS

## 2024-03-10 MED ORDER — HYDROCHLOROTHIAZIDE 12.5 MG PO CAPS
12.5 MG | Freq: Every day | ORAL | Status: DC
Start: 2024-03-10 — End: 2024-03-11
  Administered 2024-03-11: 14:00:00 12.5 mg via ORAL

## 2024-03-10 MED ORDER — ESMOLOL HCL 100 MG/10ML IV SOLN
100 | INTRAVENOUS | Status: AC
Start: 2024-03-10 — End: ?

## 2024-03-10 MED ORDER — LOSARTAN POTASSIUM-HCTZ 50-12.5 MG PO TABS
50-12.5 | Freq: Every day | ORAL | Status: DC
Start: 2024-03-10 — End: 2024-03-10

## 2024-03-10 MED ORDER — SUGAMMADEX SODIUM 200 MG/2ML IV SOLN
200 | Freq: Once | INTRAVENOUS | Status: DC | PRN
Start: 2024-03-10 — End: 2024-03-10
  Administered 2024-03-10: 14:00:00 200 via INTRAVENOUS

## 2024-03-10 MED ORDER — MORPHINE SULFATE (PF) 1 MG/ML IV SOLN
1 | INTRAVENOUS | Status: AC
Start: 2024-03-10 — End: 2024-03-10
  Administered 2024-03-10: 15:00:00 via INTRAVENOUS

## 2024-03-10 MED ORDER — ROCURONIUM BROMIDE 50 MG/5ML IV SOLN
50 | Freq: Once | INTRAVENOUS | Status: DC | PRN
Start: 2024-03-10 — End: 2024-03-10
  Administered 2024-03-10: 12:00:00 50 via INTRAVENOUS

## 2024-03-10 MED ORDER — SUGAMMADEX SODIUM 200 MG/2ML IV SOLN
200 | INTRAVENOUS | Status: AC
Start: 2024-03-10 — End: ?

## 2024-03-10 MED ORDER — SODIUM CHLORIDE 0.9 % IR SOLN
0.9 | Status: AC | PRN
Start: 2024-03-10 — End: 2024-03-10
  Administered 2024-03-10: 12:00:00 1000

## 2024-03-10 MED ORDER — LIDOCAINE HCL (PF) 2 % IJ SOLN
2 | Freq: Once | INTRAMUSCULAR | Status: DC | PRN
Start: 2024-03-10 — End: 2024-03-10
  Administered 2024-03-10: 12:00:00 100 via INTRAVENOUS

## 2024-03-10 MED ORDER — SUCCINYLCHOLINE CHLORIDE 200 MG/10ML IV SOSY
200 | INTRAVENOUS | Status: AC
Start: 2024-03-10 — End: ?

## 2024-03-10 MED ORDER — ONDANSETRON HCL 4 MG/2ML IJ SOLN
4 | Freq: Once | INTRAMUSCULAR | Status: DC | PRN
Start: 2024-03-10 — End: 2024-03-10
  Administered 2024-03-10: 14:00:00 4 via INTRAVENOUS

## 2024-03-10 MED ORDER — ROCURONIUM BROMIDE 50 MG/5ML IV SOLN
50 | INTRAVENOUS | Status: AC
Start: 2024-03-10 — End: ?

## 2024-03-10 MED ORDER — FENTANYL CITRATE (PF) 100 MCG/2ML IJ SOLN
100 | Freq: Once | INTRAMUSCULAR | Status: DC | PRN
Start: 2024-03-10 — End: 2024-03-10
  Administered 2024-03-10 (×2): 50 via INTRAVENOUS

## 2024-03-10 MED ORDER — DOCUSATE SODIUM 100 MG PO CAPS
100 MG | Freq: Two times a day (BID) | ORAL | Status: DC
Start: 2024-03-10 — End: 2024-03-11
  Administered 2024-03-10 – 2024-03-11 (×3): 100 mg via ORAL

## 2024-03-10 MED ORDER — ACETAMINOPHEN 500 MG PO TABS
500 | Freq: Once | ORAL | Status: AC
Start: 2024-03-10 — End: 2024-03-10
  Administered 2024-03-10: 11:00:00 1000 mg via ORAL

## 2024-03-10 MED ORDER — ONDANSETRON HCL 4 MG/2ML IJ SOLN
4 MG/2ML | Freq: Four times a day (QID) | INTRAMUSCULAR | Status: DC | PRN
Start: 2024-03-10 — End: 2024-03-11

## 2024-03-10 MED ORDER — PROCHLORPERAZINE EDISYLATE 10 MG/2ML IJ SOLN
10 MG/2ML | Freq: Four times a day (QID) | INTRAMUSCULAR | Status: DC | PRN
Start: 2024-03-10 — End: 2024-03-11

## 2024-03-10 MED ORDER — PROPOFOL 200 MG/20ML IV EMUL
200 | INTRAVENOUS | Status: AC
Start: 2024-03-10 — End: ?

## 2024-03-10 MED FILL — FENTANYL CITRATE (PF) 100 MCG/2ML IJ SOLN: 100 MCG/2ML | INTRAMUSCULAR | Qty: 2 | Fill #0

## 2024-03-10 MED FILL — HYDROMORPHONE HCL 1 MG/ML IJ SOLN: 1 MG/ML | INTRAMUSCULAR | Qty: 1 | Fill #0

## 2024-03-10 MED FILL — SUCCINYLCHOLINE CHLORIDE 200 MG/10ML IV SOSY: 200 MG/10ML | INTRAVENOUS | Qty: 10 | Fill #0

## 2024-03-10 MED FILL — XYLOCAINE-MPF 2 % IJ SOLN: 2 % | INTRAMUSCULAR | Qty: 5 | Fill #0

## 2024-03-10 MED FILL — BRIDION 200 MG/2ML IV SOLN: 200 MG/2ML | INTRAVENOUS | Qty: 2 | Fill #0

## 2024-03-10 MED FILL — ACETAMINOPHEN EXTRA STRENGTH 500 MG PO TABS: 500 MG | ORAL | Qty: 2 | Fill #0

## 2024-03-10 MED FILL — MORPHINE SULFATE 1 MG/ML PCA (DISCRETE DOSING): 1 mg/mL | INTRAVENOUS | Qty: 30 | Fill #0

## 2024-03-10 MED FILL — DOK 100 MG PO CAPS: 100 MG | ORAL | Qty: 1 | Fill #0

## 2024-03-10 MED FILL — ROCURONIUM BROMIDE 50 MG/5ML IV SOLN: 50 MG/5ML | INTRAVENOUS | Qty: 5 | Fill #0

## 2024-03-10 MED FILL — ESMOLOL HCL 100 MG/10ML IV SOLN: 100 MG/10ML | INTRAVENOUS | Qty: 10 | Fill #0

## 2024-03-10 MED FILL — KETOROLAC TROMETHAMINE 30 MG/ML IJ SOLN: 30 MG/ML | INTRAMUSCULAR | Qty: 1 | Fill #0

## 2024-03-10 MED FILL — CEFAZOLIN SODIUM 2 G IV SOLR: 2 g | INTRAVENOUS | Qty: 2000 | Fill #0

## 2024-03-10 MED FILL — DIPRIVAN 200 MG/20ML IV EMUL: 200 MG/20ML | INTRAVENOUS | Qty: 20 | Fill #0

## 2024-03-10 MED FILL — MORPHINE SULFATE (PF) 1 MG/ML IV SOLN: 1 mg/mL | INTRAVENOUS | Qty: 30 | Fill #0

## 2024-03-10 MED FILL — MIDAZOLAM HCL 2 MG/2ML IJ SOLN: 2 MG/ML | INTRAMUSCULAR | Qty: 2 | Fill #0

## 2024-03-10 MED FILL — DEXAMETHASONE SODIUM PHOSPHATE 20 MG/5ML IJ SOLN: 20 MG/5ML | INTRAMUSCULAR | Qty: 5 | Fill #0

## 2024-03-10 MED FILL — ONDANSETRON HCL 4 MG/2ML IJ SOLN: 4 MG/2ML | INTRAMUSCULAR | Qty: 2 | Fill #0

## 2024-03-10 NOTE — Plan of Care (Signed)
 Problem: Discharge Planning  Goal: Discharge to home or other facility with appropriate resources  Outcome: Progressing     Problem: ABCDS Injury Assessment  Goal: Absence of physical injury  Outcome: Progressing     Problem: Pain  Goal: Verbalizes/displays adequate comfort level or baseline comfort level  Outcome: Progressing  Flowsheets (Taken 03/10/2024 1445)  Verbalizes/displays adequate comfort level or baseline comfort level: Assess pain using appropriate pain scale     Problem: Safety - Adult  Goal: Free from fall injury  Outcome: Progressing

## 2024-03-10 NOTE — Progress Notes (Signed)
 Patient admitted to room 3122 from PACU.  Patient oriented to room, call light, bed rails, phone, lights and bathroom.  Patient instructed about the schedule of the day including: vital sign frequency, lab draws, possible tests, frequency of MD and staff rounds, and I &O's.  Patient instructed about prescribed diet, how to call dietary and use remote to work the television. Bed alarm in place, patient aware of placement and reason. Bed locked, in lowest position, side rails up 2/4, call light within reach.  Will continue to monitor.

## 2024-03-10 NOTE — Progress Notes (Signed)
 Vaginal Sweep Documentation     Vaginal prep sponge count performed by A.T and J.W. Count correct.   Vaginal sweep performed by DR. FLICK at 0958. No foreign objects or vaginal tears noted.

## 2024-03-10 NOTE — Progress Notes (Signed)
 Pt arrived to PACU from OR. Pt asleep on 4l/nc, awakens easily. Abd soft. Lower abd dressing C/D/I. Foley to gravity draining clear, yellow urine. Pt denies c/o pain at this time.

## 2024-03-10 NOTE — H&P (Signed)
 Date of Surgery Update:  Katie Hopkins was seen, history and physical examination reviewed, and patient examined by me today. There have been no significant clinical changes since the completion of the previous history and physical.    The risk, benefits, and alternatives of the proposed procedure have been explained to the patient (or appropriate guardian) and understanding verbalized. All questions answered. Patient wishes to proceed.    Electronically signed by: Mackie Sayre, MD,03/10/2024,7:23 AM

## 2024-03-10 NOTE — Progress Notes (Signed)
 4 Eyes Skin Assessment     NAME:  Katie Hopkins  DATE OF BIRTH:  1996-07-23  MEDICAL RECORD NUMBER:  [id number]    The patient is being assessed for  Admission    I agree that at least one RN has performed a thorough Head to Toe Skin Assessment on the patient. ALL assessment sites listed below have been assessed.      Areas assessed by both nurses:    Head, Face, Ears, Shoulders, Back, Chest, Arms, Elbows, Hands, Sacrum. Buttock, Coccyx, Ischium, and Legs. 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 No pressure wounds.     Does the Patient have a Wound? No noted wound(s)       Braden Prevention initiated by RN: No  Wound Care Orders initiated by RN: No    Pressure Injury (Stage 3,4, Unstageable, DTI, NWPT, and Complex wounds) if present, place Wound referral order by RN under ORDER ENTRY: No    New Ostomies, if present place, Ostomy referral order under ORDER ENTRY: No     Nurse 1 eSignature: Electronically signed by Evaristo Hind, RN on 03/10/24 at 5:24 PM EDT    **SHARE this note so that the co-signing nurse can place an eSignature**    Nurse 2 eSignature: Electronically signed by Grandville Lax, RN on 03/10/24 at 6:01 PM EDT

## 2024-03-10 NOTE — Brief Op Note (Signed)
 Brief Postoperative Note      Patient: Katie Hopkins  Date of Birth: 1996/10/20  MRN: [id number]    Date of Procedure: 04-04-2024    Pre-Op Diagnosis Codes:      * Bilateral dermoid cysts of ovaries [D27.0, D27.1]    Post-Op Diagnosis: Same       Procedure(s):  LAPAROTOMY RESCTION OF BILATERAL DERMOIDS    Surgeon(s):  Alecxis Baltzell, Katrinka Parr, MD    Assistant:  Surgical Assistant: Oliva Beth; Madie Schilling, RN    Anesthesia: General    Estimated Blood Loss (mL): less than 100     Complications: None    Specimens:   ID Type Source Tests Collected by Time Destination   A : RIGHT OVARIAN DERMOID Tissue Tissue SURGICAL PATHOLOGY Mackie Sayre, MD 04-Apr-2024 [phone]    B : LEFT OVARIAN DERMOID Tissue Tissue SURGICAL PATHOLOGY Mackie Sayre, MD 04-04-2024 0912        Implants:  * No implants in log *      Drains:   Urinary Catheter 2024/04/04 2 Way (Active)       Findings:  Infection Present At Time Of Surgery (PATOS) (choose all levels that have infection present):  No infection present  Other Findings: BILATERAL DERMOIDS    Electronically signed by Mackie Sayre, MD on 04-04-24 at 9:46 AM

## 2024-03-10 NOTE — Progress Notes (Signed)
 Patient resting in bed quietly this evening, A&Ox 4. Vital signs stable. PIV flushed without issue, dressing CDI, PCA pump and fluids infusing. Denies pain. All nightly medication taken whole without complaint (see eMAR for administration). Denies N/V, SOB, lightheadedness/dizziness. Foley remains in place, patent and draining. Dressing to lower abdomen remains CDI, no signs of redness, drainage, or odor. No additional needs verbalized at this time. Standard safety precautions in place and call light within reach. Will continue to monitor and assess.

## 2024-03-10 NOTE — Anesthesia Post-Procedure Evaluation (Signed)
 Department of Anesthesiology  Postprocedure Note    Patient: Katie Hopkins  MRN: [id number]  Birthdate: January 06, 1996  Date of evaluation: 03/10/2024    Procedure Summary       Date: 03/10/24 Room / Location: [facility] OR 02 / [facility]    Anesthesia Start: [phone] Anesthesia Stop: 1014    Procedure: LAPAROTOMY RESCTION OF BILATERAL DERMOIDS (Bilateral: Abdomen) Diagnosis:       Bilateral dermoid cysts of ovaries      (Bilateral dermoid cysts of ovaries [D27.0, D27.1])    Surgeons: Mackie Sayre, MD Responsible Provider: Saverio Curling, MD    Anesthesia Type: General ASA Status: 2            Anesthesia Type: General    Aldrete Phase I: Aldrete Score: 8    Aldrete Phase II:      Anesthesia Post Evaluation    Patient location during evaluation: PACU  Patient participation: complete - patient participated  Level of consciousness: awake and alert  Pain score: 5  Nausea & Vomiting: no nausea  Cardiovascular status: hemodynamically stable  Respiratory status: acceptable  Hydration status: stable  Pain management: adequate    No notable events documented.

## 2024-03-10 NOTE — Anesthesia Pre-Procedure Evaluation (Signed)
 [facility] Department of Anesthesiology  Pre-Anesthesia Evaluation/Consultation       Name:  Katie Hopkins  DOB: 1996/08/17  Age:  28 y.o.                                           MRN:  [id number]  Date: 03/10/2024           Surgeon: Surgeon(s):  Flick, Katrinka Parr, MD    Procedure: Procedure(s):  LAPAROTOMY RESCTION OF BILATERAL DERMOIDS     Allergies   Allergen Reactions    Amoxicillin Hives    Tomato Itching and Rash    Benadryl [Diphenhydramine] Hives     Patient Active Problem List   Diagnosis    Right ovarian cyst    Ovarian cyst, left    Bilateral dermoid cysts of ovaries     Past Medical History:   Diagnosis Date    Hypertension     Wears glasses      History reviewed. No pertinent surgical history.  Social History     Tobacco Use    Smoking status: Former     Types: Cigarettes    Smokeless tobacco: Current    Tobacco comments:     Quit smoking in Dec 2024   Vaping Use    Vaping status: Never Used   Substance Use Topics    Alcohol use: Yes     Comment: Socially    Drug use: Yes     Frequency: 7.0 times per week     Types: Marijuana (Weed)     Medications  No current facility-administered medications on file prior to encounter.     Current Outpatient Medications on File Prior to Encounter   Medication Sig Dispense Refill    losartan -hydroCHLOROthiazide  (HYZAAR) 50-12.5 MG per tablet TAKE 1 TABLET BY MOUTH DAILY 90 tablet 1    amLODIPine  (NORVASC ) 5 MG tablet TAKE 1 TABLET BY MOUTH DAILY 90 tablet 1     Current Facility-Administered Medications   Medication Dose Route Frequency Provider Last Rate Last Admin    sodium chloride  flush 0.9 % injection 5-40 mL  5-40 mL IntraVENous 2 times per day Carl Charnley, MD        sodium chloride  flush 0.9 % injection 5-40 mL  5-40 mL IntraVENous PRN Carl Charnley, MD        0.9 % sodium chloride  infusion   IntraVENous PRN Carl Charnley, MD        lactated ringers  infusion   IntraVENous Continuous Flick, Katrinka Parr, MD        acetaminophen  (TYLENOL ) tablet 1,000 mg  1,000 mg Oral  Once Flick, Robert Paul, MD        ceFAZolin  (ANCEF ) 2,000 mg in sodium chloride  0.9 % 50 mL IVPB (addEASE)  2,000 mg IntraVENous On Call to OR Mackie Sayre, MD         Vital Signs (Current)   Vitals:    02/18/24 1418 03/10/24 0617   Weight: 96.2 kg (212 lb) 91.7 kg (202 lb 1.6 oz)   Height: 1.524 m (5') 1.524 m (5')                                            Vital Signs Statistics (for past 48 hrs)  No data recorded  BP Readings from Last 3 Encounters:   02/15/24 138/88   12/18/23 122/82   12/04/23 (!) 200/110       BMI  Body mass index is 39.47 kg/m.  Estimated body mass index is 39.47 kg/m as calculated from the following:    Height as of this encounter: 1.524 m (5').    Weight as of this encounter: 91.7 kg (202 lb 1.6 oz).    CBC   Lab Results   Component Value Date/Time    WBC 7.8 06/06/2023 10:00 AM    RBC 4.13 06/06/2023 10:00 AM    HGB 13.4 06/06/2023 10:00 AM    HCT 37.7 06/06/2023 10:00 AM    MCV 91.3 06/06/2023 10:00 AM    RDW 13.2 06/06/2023 10:00 AM    PLT 235 06/06/2023 10:00 AM     CMP    Lab Results   Component Value Date/Time    NA 138 12/18/2023 11:45 AM    K 3.5 12/18/2023 11:45 AM    K 3.8 06/06/2023 10:00 AM    CL 95 12/18/2023 11:45 AM    CO2 32 12/18/2023 11:45 AM    BUN 8 12/18/2023 11:45 AM    CREATININE 0.9 12/18/2023 11:45 AM    AGRATIO 1.5 12/04/2023 11:46 AM    LABGLOM 89 12/18/2023 11:45 AM    LABGLOM >60 10/16/2022 10:20 AM    GLUCOSE 88 12/18/2023 11:45 AM    CALCIUM 10.3 12/18/2023 11:45 AM    BILITOT <0.2 12/04/2023 11:46 AM    ALKPHOS 83 12/04/2023 11:46 AM    AST 23 12/04/2023 11:46 AM    ALT 23 12/04/2023 11:46 AM     BMP    Lab Results   Component Value Date/Time    NA 138 12/18/2023 11:45 AM    K 3.5 12/18/2023 11:45 AM    K 3.8 06/06/2023 10:00 AM    CL 95 12/18/2023 11:45 AM    CO2 32 12/18/2023 11:45 AM    BUN 8 12/18/2023 11:45 AM    CREATININE 0.9 12/18/2023 11:45 AM    CALCIUM 10.3 12/18/2023 11:45 AM    LABGLOM 89 12/18/2023 11:45 AM    LABGLOM >60 10/16/2022  10:20 AM    GLUCOSE 88 12/18/2023 11:45 AM     POCGlucose  No results for input(s): "GLUCOSE" in the last 72 hours.   Coags  No results found for: "PROTIME", "INR", "APTT"  HCG (If Applicable)   Lab Results   Component Value Date    PREGTESTUR Negative 06/06/2023    PREGSERUM Negative 05/20/2022      ABGs No results found for: "PHART", "PO2ART", "PCO2ART", "HCO3ART", "BEART", "O2SATART"   Type & Screen (If Applicable)  No results found for: "LABABO"                         BMI: Wt Readings from Last 3 Encounters:       NPO Status:  >8h                          Anesthesia Evaluation  Patient summary reviewed   no history of anesthetic complications:   Airway: Mallampati: II  TM distance: >3 FB   Neck ROM: full  Mouth opening: > = 3 FB   Dental: normal exam         Pulmonary:normal exam    (+)  current smoker (marijuana)    (-) COPD and sleep apnea                           Cardiovascular:    (+) hypertension:    (-) past MI, CABG/stent and no hyperlipidemia        Rate: normal                    Neuro/Psych:      (-) seizures, TIA and CVA           GI/Hepatic/Renal:        (-) GERD, liver disease and no renal disease       Endo/Other:        (-) diabetes mellitus, hypothyroidism               Abdominal:             Vascular:     - DVT and PE.      Other Findings:             Anesthesia Plan      general     ASA 2       Induction: intravenous.    MIPS: Postoperative opioids intended and Prophylactic antiemetics administered.  Anesthetic plan and risks discussed with patient.      Plan discussed with CRNA.                    This pre-anesthesia assessment may be used as a history and physical.    DOS STAFF ADDENDUM:    Pt seen and examined, chart reviewed (including anesthesia, drug and allergy history).  No interval changes to history and physical examination.  Anesthetic plan, risks, benefits, alternatives, and personnel involved discussed with patient.  Patient verbalized an understanding and agrees to  proceed.      Saverio Curling, MD  Mar 10, 2024  6:31 AM

## 2024-03-10 NOTE — Progress Notes (Signed)
 Morphine PCA started per MD order.

## 2024-03-10 NOTE — Progress Notes (Signed)
 Patient tolerated regular diet. Patient reports pain is well controlled at this time. Patient resting comfortably in bed, denies any further needs

## 2024-03-10 NOTE — Op Note (Unsigned)
 [facility]          [address] [place], [place] [address]                            OPERATIVE REPORT      PATIENT NAME: Katie Hopkins, Katie Hopkins              DOB: April 30, 1996  MED REC NO: [id number]                      ROOM: OR  ACCOUNT NO: [id number]                       ADMIT DATE: 03/10/2024  PROVIDER: Mackie Sayre, MD      DATE OF PROCEDURE:  03/10/2024    SURGEON:  Mackie Sayre, MD    INDICATION FOR PROCEDURE:  The patient is a 28 year old female.  She is gravida 1, para 1.  She presents with bilateral ovarian dermoids.  Ultrasound showed the right dermoid measured 7.8 cm and the left measured 6.7 cm.  I recommended a laparotomy with resection of bilateral dermoids.  I discussed the technique, the recovery, and the risks with the patient.  They include, but are not limited to, bleeding, infection, damage to her internal organs such as bladder and bowels and ureters with need for subsequent reparative surgery if damage occurred.  The patient voiced her understanding and agreed to proceed with surgery.    PREOPERATIVE DIAGNOSIS:  Bilateral ovarian dermoids.    POSTOPERATIVE DIAGNOSIS:  Bilateral ovarian dermoids.    PROCEDURE:  Laparotomy, resection of bilateral ovarian dermoids.    ANESTHETIC:  General endotracheal anesthetic.    ESTIMATED BLOOD LOSS:  Less than 100 mL.    PATHOLOGY:  Right and left dermoid.    COMPLICATIONS:  None.    DRAINS:  Foley.    DISPOSITION:  Stable to recovery room.    DESCRIPTION OF PROCEDURE:  Patient was taken to the operating room, was prepped and draped in normal sterile fashion in the dorsal supine position.  A Pfannenstiel skin incision was made with a scalpel.  It was carried through to the underlying layer of the fascia.  The fascia was nicked in the midline.  The incision was extended laterally.  Fascia was separated from the underlying rectus muscle using sharp dissection.  The rectus muscles were separated in the midline.   The peritoneum was entered sharply.  The incision was extended superiorly and inferiorly.  Good hemostasis was noted.  Upon entrance into the abdomen I could see patient's left ovary appeared to be grossly enlarged.  I was able to grab the ovary with 2 Allis clamps, and between the 2 Allis clamps I used a scalpel and was able to get into the large ovarian dermoid.  The area around the left ovary was packed thoroughly with several layers of moist lap sponges, and so there was some spillage of oil from the dermoid itself but the spillage was collected and absorbed into the lap sponges, and it did not spill into the abdomen.  Using a suction tip, I was able to evacuate the contents of the left ovarian dermoid.  There was a lot of hair in the dermoid.  I was unable to feel any teeth.  Once this was emptied of all the oil, then I was able to remove the entire cyst wall of the left  ovarian dermoid.  This was removed in its entirety and it was sent off to be evaluated by pathologist.  There was a little bit of bleeding at the bed of the cyst wall where the cyst wall had been.  This was made hemostatic using Bovie electrocautery and then a powdered hemostatic agent, Avitene.  There was also another cyst that was noted on the left ovary.  This was incised in similar fashion, but instead of this being another dermoid, this appeared to be a hemorrhagic ovarian cyst.  This was evacuated in its entirety.  The incision into the cyst was enlarged to prevent reaccumulation of cyst fluid and it was hemostatic.  At this point, the lap sponges were removed as the ovary was returned to the abdomen.  Attention then was turned to the patient's right ovary.  This was exteriorized and it was packed in the similar fashion.  Again, I was able to grasp it with 2 Allis clamps and then between the 2 Allis clamps, I was able to make an incision.  This was significantly larger than the other dermoid.  I was able to evacuate all that oil from  here.  There was a lot of hair in the base of the dermoid and I was also able to feel calcium deposits that are consistent with rudimentary teeth in this.  I made numerous attempts to remove the cyst wall from the ovarian tissue without success and so instead what I did was I resected the dermoid from the ovary itself and then I oversewed the ovarian tissue to reapproximate that.  There was no spillage of oil or dermoid contents into the abdomen.  There was a little bit of spillage, but again it was absorbed quickly by the surrounding lap sponges.  The remaining right ovary was reapproximated with a running stitch of 0 Vicryl and good hemostasis was noted.  At this point, the right ovary then also was returned to the abdomen as the lap sponges were all removed.  Good hemostasis was noted.  At this point, the peritoneum was reapproximated with a running stitch of 3-0 Vicryl.  The rectus muscles were inspected.  They were hemostatic.  The rectus fascia was reapproximated with a running stitch of 0 Vicryl.  Subcutaneous tissue was reapproximated with a running stitch of 3-0 Vicryl and the skin was closed with a subcuticular stitch of 4-0 Vicryl.  Pressure dressing was placed across the incisions.  She then was allowed to awake from anesthesia, after which time she was transferred from the operating room to the recovery room where she went in stable condition.  All sponge, lap, and needles were correct x2.          Mackie Sayre, MD      D:  03/10/2024 09:59:37     T:  03/10/2024 11:06:53     RPF/AQS  Job #:  [id number]     Doc#:  [id number]

## 2024-03-11 LAB — CBC
Hematocrit: 34.8 % — ABNORMAL LOW (ref 36.0–48.0)
Hemoglobin: 12.2 g/dL (ref 12.0–16.0)
MCH: 31.7 pg (ref 26.0–34.0)
MCHC: 34.9 g/dL (ref 31.0–36.0)
MCV: 90.8 fL (ref 80.0–100.0)
MPV: 10.6 fL — ABNORMAL HIGH (ref 5.0–10.5)
Platelets: 204 10*3/uL (ref 135–450)
RBC: 3.84 M/uL — ABNORMAL LOW (ref 4.00–5.20)
RDW: 13.6 % (ref 12.4–15.4)
WBC: 13.6 10*3/uL — ABNORMAL HIGH (ref 4.0–11.0)

## 2024-03-11 MED ORDER — OXYCODONE-ACETAMINOPHEN 5-325 MG PO TABS
5-325 | ORAL | Status: DC | PRN
Start: 2024-03-11 — End: 2024-03-11

## 2024-03-11 MED FILL — KETOROLAC TROMETHAMINE 30 MG/ML IJ SOLN: 30 MG/ML | INTRAMUSCULAR | Qty: 1 | Fill #0

## 2024-03-11 MED FILL — ACETAMINOPHEN EXTRA STRENGTH 500 MG PO TABS: 500 MG | ORAL | Qty: 2 | Fill #0

## 2024-03-11 MED FILL — AMLODIPINE BESYLATE 5 MG PO TABS: 5 MG | ORAL | Qty: 1 | Fill #0

## 2024-03-11 MED FILL — LOSARTAN POTASSIUM 50 MG PO TABS: 50 MG | ORAL | Qty: 1 | Fill #0

## 2024-03-11 MED FILL — HYDROCHLOROTHIAZIDE 12.5 MG PO CAPS: 12.5 MG | ORAL | Qty: 1 | Fill #0

## 2024-03-11 MED FILL — DOK 100 MG PO CAPS: 100 MG | ORAL | Qty: 1 | Fill #0

## 2024-03-11 MED FILL — OXYCODONE-ACETAMINOPHEN 5-325 MG PO TABS: 5-325 MG | ORAL | Qty: 1 | Fill #0

## 2024-03-11 MED FILL — MORPHINE SULFATE 1 MG/ML PCA (DISCRETE DOSING): 1 mg/mL | INTRAVENOUS | Qty: 30 | Fill #0

## 2024-03-11 NOTE — Plan of Care (Signed)
 Problem: Discharge Planning  Goal: Discharge to home or other facility with appropriate resources  03/11/2024 0010 by Mayer Speaker, RN  Outcome: Progressing  Flowsheets (Taken 03/11/2024 0010)  Discharge to home or other facility with appropriate resources:   Identify barriers to discharge with patient and caregiver   Identify discharge learning needs (meds, wound care, etc)  03/10/2024 1452 by Evaristo Hind, RN  Outcome: Progressing  Flowsheets (Taken 03/10/2024 1450)  Discharge to home or other facility with appropriate resources: Identify barriers to discharge with patient and caregiver     Problem: ABCDS Injury Assessment  Goal: Absence of physical injury  03/11/2024 0010 by Mayer Speaker, RN  Outcome: Progressing  Flowsheets (Taken 03/11/2024 0010)  Absence of Physical Injury: Implement safety measures based on patient assessment  03/10/2024 1452 by Evaristo Hind, RN  Outcome: Progressing     Problem: Pain  Goal: Verbalizes/displays adequate comfort level or baseline comfort level  03/11/2024 0010 by Mayer Speaker, RN  Outcome: Progressing  Flowsheets (Taken 03/11/2024 0010)  Verbalizes/displays adequate comfort level or baseline comfort level: Encourage patient to monitor pain and request assistance  03/10/2024 1452 by Evaristo Hind, RN  Outcome: Progressing  Flowsheets (Taken 03/10/2024 1445)  Verbalizes/displays adequate comfort level or baseline comfort level: Assess pain using appropriate pain scale     Problem: Safety - Adult  Goal: Free from fall injury  03/11/2024 0010 by Mayer Speaker, RN  Outcome: Progressing  Flowsheets (Taken 03/11/2024 0010)  Free From Fall Injury: Instruct family/caregiver on patient safety  03/10/2024 1452 by Evaristo Hind, RN  Outcome: Progressing

## 2024-03-11 NOTE — Progress Notes (Addendum)
 PCA pump syringe replaced at this time with second RN Tammy. 5mL of morphine  from old syringe wasted with RN Tammy.

## 2024-03-11 NOTE — Discharge Summary (Signed)
 [facility]          [address] [place], [place] [address]                            DISCHARGE SUMMARY      PATIENT NAME: Katie Hopkins, Katie Hopkins              DOB: 1996-03-30  MED REC NO: [id number]                      ROOM: [id number]  ACCOUNT NO: [id number]                       ADMIT DATE: 03/10/2024  PROVIDER: Mackie Sayre, MD      HISTORY OF PRESENT ILLNESS:  The patient is a 28 year old female.  She is gravida 1, para 1.  She presents with bilateral ovarian dermoids.  Ultrasound showed a right dermoid measuring 7.8 cm and the left measured 6.7 cm.  I recommended a laparotomy with resection of bilateral dermoids.  I discussed the technique, the recovery, and the risks with the patient.  They include, but are not limited to, bleeding, infection, damage to her internal organs such as bladder and bowel and ureters, with need for subsequent reparative surgery if damage occurred.  The patient voiced understanding and agreed to proceed with the surgery.    OPERATIVE NOTE:    PREOPERATIVE DIAGNOSIS:  Bilateral ovarian dermoids.  POSTOPERATIVE DIAGNOSIS:  Bilateral ovarian dermoids.  PROCEDURES:  Laparotomy, resection of bilateral ovarian dermoids.  ANESTHETIC:  General endotracheal anesthetic.  ESTIMATED BLOOD LOSS:  Less than 100 mL.  PATHOLOGY:  Right and left dermoid.  COMPLICATIONS:  None.  DRAINS:  Foley.  DISPOSITION:  Stable to recovery room.    HOSPITAL COURSE:  Postoperatively, the patient was doing well after surgery.  She had left against medical advice from the hospital and was not needing pain medicines.  Her bowels have been moving well when I saw her at her postoperative visit.  On postoperative day 1, the patient was doing well after her surgery.  She denied complaints.  She had no nausea, no vomiting.  She had good analgesia.  She had no flatus.  She was tolerating p.o.  Her hemoglobin and hematocrit were 12 and 34, and she was changed over to oral  analgesics.  Later in the day, the patient had left against medical advice.  She was doing well, did not require pain medicines, and so she left the hospital.  She was subsequently seen the following week in the office.  She was continuing to do well and denied any problems.  She was to follow up in the office for a 59-month examination.        Mackie Sayre, MD    D:  04/19/2024 15:55:49     T:  04/19/2024 21:43:41     RPF/AQS  Job #:  [id number]     Doc#:  [id number]

## 2024-03-11 NOTE — Progress Notes (Signed)
 Patient Leaving AMA, This RN took Foley and IV out before patient leave. Patient signed AMA paperwork. Two nurses Rayetta Cain and [place]) witness and signed AMA paperwork.     Electronically signed by Kalvin Orf, RN on 03/11/2024 at 12:05 PM

## 2024-03-11 NOTE — Plan of Care (Signed)
 Problem: Discharge Planning  Goal: Discharge to home or other facility with appropriate resources  03/11/2024 1206 by Kalvin Orf, RN  Flowsheets (Taken 03/11/2024 1035)  Discharge to home or other facility with appropriate resources: Identify barriers to discharge with patient and caregiver     Problem: ABCDS Injury Assessment  Goal: Absence of physical injury  03/11/2024 1206 by Kalvin Orf, RN  Flowsheets (Taken 03/11/2024 1042)  Absence of Physical Injury: Implement safety measures based on patient assessment     Problem: Pain  Goal: Verbalizes/displays adequate comfort level or baseline comfort level  03/11/2024 1206 by Kalvin Orf, RN  Verbalizes/displays adequate comfort level or baseline comfort level: Encourage patient to monitor pain and request assistance     Problem: Safety - Adult  Goal: Free from fall injury  03/11/2024 1206 by Kalvin Orf, RN  Flowsheets (Taken 03/11/2024 1042)  Free From Fall Injury: Instruct family/caregiver on patient safety

## 2024-03-11 NOTE — Progress Notes (Signed)
 This RN Called and left message to Dr. Warrick Habermann for  Patient discharge or patient going to leave AMA because, she have 29 years old son at home and there is no one to watch and take care of him. Care on going.     Electronically signed by Kalvin Orf, RN on 03/11/2024 at 8:58 AM

## 2024-03-11 NOTE — Progress Notes (Signed)
 Patient PCA pump discontinued. Two Nurses dispose 25 ML of Morphine  in [facility]. Rayetta Cain (RN) and Audrey Leash Banker) dispose the medication.     Electronically signed by Kalvin Orf, RN on 03/11/2024 at 10:19 AM   Electronically signed by Lorain Robson, RN on 03/11/2024 at 10:22 AM

## 2024-03-11 NOTE — Progress Notes (Signed)
 5 ml of Morphine  per PCA pump syringe with RN Mayer Speaker. Electronically signed by Geraline Knapp, RN on 03/11/2024 at 6:32 AM

## 2024-03-11 NOTE — Progress Notes (Signed)
 This RN Called and left message to Dr. Warrick Habermann for discontinue of PCA pump and foley removal. Waiting for call back.     Electronically signed by Kalvin Orf, RN on 03/11/2024 at 8:20 AM

## 2024-03-11 NOTE — Progress Notes (Signed)
 Pts feeling good after surgery.  She denies complaints.  No n/v, good analgesia.  No flatus.  Tolerating po.  Af, vss  Abd- soft, approp tender, incision covered  Ext- no edema, nt  H/h- 12/34  Assess:  POD 1 s/p laparotomy bilateral removal of dermoids  Plan:  change to po analgesia

## 2024-03-11 NOTE — Progress Notes (Signed)
 Patient has left AMA. Dr. Warrick Habermann, MD made aware. No new orders at this time. Electronically signed by Lorain Robson, RN on 03/11/2024 at 12:14 PM

## 2024-03-11 NOTE — Progress Notes (Signed)
 Pharmacy Medication Reconciliation Note     List of medications patient is currently taking is complete.    Source of information:   1. Rx disp history  2. Patient interview    Notes regarding home medications:   1. none      Denies taking any other OTC or herbal medications    Caesar Caster, [facility], RPh 03/11/2024 11:39 AM

## 2024-03-14 NOTE — Care Coordination-Inpatient (Signed)
 Care Transitions Note    Initial Call - Call within 2 business days of discharge: Yes    Patient Current Location:  Home: [address]  [place] [address]    Care Transition Nurse contacted the patient by telephone to perform post hospital discharge assessment, verified name and DOB as identifiers.  Provided introduction to self, and explanation of the Care Transition Nurse role.    Patient: Katie Hopkins    Patient DOB: [birth year]   MRN: [id number]    Reason for Admission: Scheduled GYN surgical procedure - Laparotomy resection of bilateral ovarian dermoids  Discharge Date: 03/11/24  RURS: Readmission Risk Score: 3.5      Last Discharge Facility       Date Complaint Diagnosis Description Type Department Provider    03/10/24  Bilateral dermoid cysts of ovaries Admission (Discharged) [facility] Mackie Sayre, MD            Was this an external facility discharge? No    Additional needs identified to be addressed with provider   No needs identified             Method of communication with provider: none.    Patients top risk factors for readmission: medical condition-surgical recovery    Interventions to address risk factors:   Review of patient management of conditions/medications: surgical recovery    Care Summary Note: Laianna states "I am well."  Greicy states her incision is intact and covered with tape - denies any drainage at this time.  She states her bowels are moving and she is urinating without difficulty.  Mushtaq states she is tolerating food & fluids - denies any N/V at this time.  She states she is up and about on her own for ambulation.  Tylaya denies any fever at this time.        Care Transition Nurse reviewed medical action plan with patient. Aishwarya does not have any discharge instructions/AVS. She signed out AMA. Brynlynn stated she had to - she had no support for her 57 year old son.The patient was given an opportunity to ask questions; all questions answered at this time.. The patient verbalized  understanding.   Were discharge instructions available to patient? No.   Reviewed appropriate site of care based on symptoms and resources available to patient including: PCP  Specialist  Urgent care clinics  When to call 911. The patient agrees to contact the primary care provider and/or specialist office for questions related to their healthcare.      Advance Care Planning:   Does patient have an Advance Directive: health care decision maker updated.  Advance Care Planning   Healthcare Decision Maker:    Primary Decision Maker: Roberta,Roberts - Parent [phone]    Gloriann states she does not have a Living Will or Healthcare POA. She states she does not want her spouse making decisions for her. Encouraged her that she needs to complete the Healthcare POA document to have her mother be the decision maker. Zorina verbalized understanding.     Medication Reconciliation:  Medication reconciliation was performed with patient,[id number] entered: N/A.     Remote Patient Monitoring:  Offered patient enrollment in the Remote Patient Monitoring (RPM) program for in-home monitoring: Patient is not eligible for RPM program because: patient does not have qualifying diagnosis.    Assessments:  Care Transitions 24 Hour Call    Do you have a copy of your discharge instructions?: No  Why don't you have your discharge instructions?: left  AMA  Do you have all of your prescriptions and are they filled?: Yes  Have you been contacted by a [facility] Pharmacist?: No  Have you scheduled your follow up appointment?: Yes  How are you going to get to your appointment?: Car - drive self  Do you have support at home?: Partner/Spouse/SO, Child  Do you feel like you have everything you need to keep you well at home?: Yes  Care Transitions Interventions          Follow Up Appointment:   Discussed follow up appointments. Patient has hospital follow up appointment scheduled within 7 days of discharge. Patient has post op appt scheduled. Maddex no longer  sees [facility] [employer] as a PCP - she now sees someone in [place]  where she currently lives. [place] states she will be driving herself to the post op appt.  Future Appointments         Provider Specialty Dept Phone    03/18/2024 11:30 AM Flick, Katrinka Parr, MD Gynecology [phone]            Care Transition Nurse provided contact information.  Plan for follow-up call in 6-10 days based on severity of symptoms and risk factors.  Plan for next call:  surgical recovery - result of post op appt.      Carlton Chick RN BSN  Care Transition Nurse  [phone]

## 2024-03-14 NOTE — Telephone Encounter (Signed)
 Spoke to pt, she received forms. Post op appt scheduled.

## 2024-03-14 NOTE — Telephone Encounter (Signed)
 Patient called requesting a return call regarding a "form" that was suppose to be emailed to her last week

## 2024-03-18 ENCOUNTER — Ambulatory Visit: Admit: 2024-03-18 | Discharge: 2024-03-18 | Payer: Medicaid (Managed Care) | Attending: Obstetrics & Gynecology

## 2024-03-18 VITALS — BP 130/86 | Wt 204.8 lb

## 2024-03-18 DIAGNOSIS — Z9889 Other specified postprocedural states: Secondary | ICD-10-CM

## 2024-03-18 NOTE — Progress Notes (Signed)
 Pts doing well after surgery.  Left AMA from hospital, not needing pain meds.  Bowels moving well.  Ready to return to work.  I discussed surgical findings and path.    Af, vss  Abd- incision intact and no erythema  Assess: normal post op state  Plan:  f/u one month

## 2024-03-22 NOTE — Care Coordination-Inpatient (Signed)
 Care Transitions Note    Follow Up Call     Patient: Katie Hopkins                               Patient DOB: Mar 10, 1996   MRN: [id number]                             Reason for Admission: Scheduled GYN surgical procedure - Laparotomy resection of bilateral ovarian dermoids  Discharge Date: 03/11/24         RURS: Readmission Risk Score: 3.5     Patient Current Location:  Home: [address]  [place] [address]    LPN Care Coordinator contacted the patient by telephone. Verified name and DOB as identifiers.    Additional needs identified to be addressed with provider   No needs identified                 Method of communication with provider: none.    Care Summary Note:   Patient reports she is doing well. She had f/u visit and everything was good. Denies nausea, vomiting, pain or discomfort. Adequate dietary intake. No urinary or bowel elimination problems. No fever or chills. Incision is intact.     Plan of care updates since last contact:    Advance Care Planning:   Does patient have an Advance Directive: not on file.  Healthcare Decision Maker:    Primary Decision Maker: Roberta,Roberts - Parent [phone]     Medication Review:  No changes since last call.     Remote Patient Monitoring:  Offered patient enrollment in the Remote Patient Monitoring (RPM) program for in-home monitoring: Patient is not eligible for RPM program because: insurance coverage.    Assessments:  Care Transitions Subsequent and Final Call    Subsequent and Final Calls  Do you have any ongoing symptoms?: No  Have your medications changed?: No  Do you have any questions related to your medications?: No  Do you currently have any active services?: No  Do you have any needs or concerns that I can assist you with?: No  Identified Barriers: None  Care Transitions Interventions  Other Interventions:              Follow Up Appointment:   [facility] appointment attended as scheduled   Future Appointments         Provider Specialty Dept Phone     04/08/2024 9:00 AM Flick, Katrinka Parr, MD Gynecology [phone]            LPN Care Coordinator provided contact information.  Plan for follow-up call in 6-10 days based on severity of symptoms and risk factors.  Plan for next call: self management-       Lyman Sander, LPN

## 2024-04-01 NOTE — Care Coordination-Inpatient (Signed)
 Care Transitions Note    Follow Up Call     Attempted to reach patient for transitions of care follow up.  Unable to reach patient.      Outreach Attempts:   HIPAA compliant voicemail left for patient.     Care Summary Note: N/A    Follow Up Appointment:   Future Appointments         Provider Specialty Dept Phone    04/08/2024 9:00 AM Flick, Katrinka Parr, MD Gynecology [phone]            Plan for follow-up call in 2-5 days based on inability to reach patient today.    Carlton Chick RN BSN  Care Transition Nurse  [phone]

## 2024-04-06 NOTE — Care Coordination-Inpatient (Signed)
 Care Transitions Note    Follow Up Call     Patient Current Location:  Home: [address]  [place] [address]    Care Transition Nurse contacted the patient by telephone. Verified name and DOB as identifiers.    Additional needs identified to be addressed with provider   No needs identified                 Method of communication with provider: none.    Care Summary Note: Spoke to patient today for f/u call. She says she is doing fairly well. She has gone back to work. She is having some occasional spasms like before her surgery. She has been to initial f/u with provider and is scheduled to go back soon. CTN advised her to treat with OTC pain meds and report to provider next visit and if worsens let them know. She says appetite is good and bowels and bladder are WNL. No other problems or issues. CTN will follow up at a later time.     Plan of care updates since last contact:  Education: reporting any abnormal s/s to providers       Assessments:  Care Transitions Subsequent and Final Call    Subsequent and Final Calls  Do you have any ongoing symptoms?: No  Have your medications changed?: No  Do you have any questions related to your medications?: No  Do you currently have any active services?: No  Do you have any needs or concerns that I can assist you with?: No  Identified Barriers: None  Care Transitions Interventions  Other Interventions:              Follow Up Appointment:   Reviewed upcoming appointment(s).  Future Appointments         Provider Specialty Dept Phone    04/08/2024 9:00 AM Flick, Katrinka Parr, MD Gynecology [phone]            Care Transition Nurse provided contact information.  Plan for follow-up call in 6-10 days based on severity of symptoms and risk factors.  Plan for next call: symptom management-spasms      Willam Harm, RN  .

## 2024-04-08 ENCOUNTER — Ambulatory Visit: Admit: 2024-04-08 | Discharge: 2024-04-08 | Payer: Medicaid (Managed Care) | Attending: Obstetrics & Gynecology

## 2024-04-08 VITALS — BP 124/82 | Wt 207.4 lb

## 2024-04-08 DIAGNOSIS — Z9889 Other specified postprocedural states: Secondary | ICD-10-CM

## 2024-04-08 NOTE — Progress Notes (Signed)
 Pts doing well after surgery.  I discussed surgical findings and path.  She notes occas right sided twinges of discomfort that may last for a few seconds off and on.  Takes no pain meds.    Af, vss  Abd- incision intact and no erythema  Assess:  normal post op state  Plan:  f/u for repeat pap

## 2024-04-12 NOTE — Care Coordination-Inpatient (Signed)
 Care Transitions Note    Final Call   Patient: Katie Hopkins                               Patient DOB: 1996-08-17   MRN: [id number]                             Reason for Admission: Scheduled GYN surgical procedure - Laparotomy resection of bilateral ovarian dermoids  Discharge Date: 03/11/24         RURS: Readmission Risk Score: 3.5     Attempted to reach patient for transitions of care follow up.  Unable to reach patient.      Outreach Attempts:   HIPAA compliant voicemail left for patient.     Patient graduated from the Care Transitions program on 04/12/2024.  Patient/family  utr .      Handoff:   Patient was not referred to the [facility] team due to unable to contact patient.      Care Summary Note:     Assessments:  Care Transitions Subsequent and Final Call    Subsequent and Final Calls  Care Transitions Interventions  Other Interventions:              Upcoming Appointments:    Future Appointments         Provider Specialty Dept Phone    04/29/2024 10:00 AM Flick, Katrinka Parr, MD Gynecology [phone]            Lyman Sander, LPN

## 2024-04-29 ENCOUNTER — Ambulatory Visit: Admit: 2024-04-29 | Discharge: 2024-04-29 | Payer: Medicaid (Managed Care) | Attending: Obstetrics & Gynecology

## 2024-04-29 VITALS — BP 122/86 | Wt 200.4 lb

## 2024-04-29 DIAGNOSIS — R87611 Atypical squamous cells cannot exclude high grade squamous intraepithelial lesion on cytologic smear of cervix (ASC-H): Secondary | ICD-10-CM

## 2024-04-29 NOTE — Progress Notes (Signed)
 Subjective:      Patient ID: Katie Hopkins is a 28 y.o. female.    HPI  pts here for repeat pap.  She had a previously abnormal pap.  Denies complaints.  Doing well after previous laparotomy.    Review of Systems Pertinent review of systems items discussed above.  All others systems items not discussed above were negative.      Objective:   Physical Exam  Af, vss  Ext- no lesions  Meatus- no lesions  Bladder- good support  Vag- no lesions  Cx- no lesions  pap  Assessment:   H/o ascus     Plan:   Call with results.  F/u pap in four months.       Lamar Deward Canning, MD

## 2024-05-03 LAB — HUMAN PAPILLOMAVIRUS (HPV) DNA PROBE THIN PREP HIGH RISK
HPV Genotype 16: NOT DETECTED
HPV Type 18: NOT DETECTED
HPVOH (Other Types): DETECTED — AB

## 2024-05-20 ENCOUNTER — Encounter: Payer: Medicaid (Managed Care) | Attending: Obstetrics & Gynecology

## 2024-05-25 ENCOUNTER — Ambulatory Visit: Admit: 2024-05-25 | Discharge: 2024-05-25 | Payer: Medicaid (Managed Care) | Attending: Obstetrics & Gynecology

## 2024-05-25 VITALS — BP 136/88 | Wt 204.8 lb

## 2024-05-25 DIAGNOSIS — R87611 Atypical squamous cells cannot exclude high grade squamous intraepithelial lesion on cytologic smear of cervix (ASC-H): Secondary | ICD-10-CM

## 2024-05-25 MED ORDER — SULFAMETHOXAZOLE-TRIMETHOPRIM 800-160 MG PO TABS
800-160 | ORAL_TABLET | Freq: Two times a day (BID) | ORAL | 0 refills | 5.00000 days | Status: DC
Start: 2024-05-25 — End: 2024-05-26

## 2024-05-25 NOTE — Addendum Note (Signed)
 Addended by: ARDEAN MOATS on: 05/25/2024 11:53 AM     Modules accepted: Orders

## 2024-05-25 NOTE — H&P (Signed)
 HISTORY AND PHYSICAL             Date: 05/25/2024        Patient Name: Katie Hopkins     Date of Birth: [birth year]      Age:  28 y.o.    Chief Complaint     Chief Complaint   Patient presents with    Pre-op Exam     LEEP      Abnormal pap    History Obtained From   patient    History of Present Illness   G1P1 presents with ascus can't r/o hgsil.  She had this last year too and neg colpo.    Past Medical History     Past Medical History:   Diagnosis Date    Hypertension     Wears glasses         Past Surgical History     Past Surgical History:   Procedure Laterality Date    LAPAROTOMY Bilateral 03/10/2024    LAPAROTOMY RESCTION OF BILATERAL DERMOIDS performed by Jannine Lamar Mt, MD at [facility] OR        Medications Prior to Admission     Prior to Admission medications    Medication Sig Start Date End Date Taking? Authorizing Provider   losartan -hydroCHLOROthiazide  (HYZAAR) 50-12.5 MG per tablet TAKE 1 TABLET BY MOUTH DAILY 06/29/23  Yes Klopp, [place], PA   amLODIPine  (NORVASC ) 5 MG tablet TAKE 1 TABLET BY MOUTH DAILY 06/25/23  Yes Klopp, [place], PA        Allergies   Amoxicillin, Tomato, and Benadryl [diphenhydramine]    Social History     Social History       Tobacco History       Smoking Status  Former Smoking Tobacco Type  Cigarettes   Pack Year History     Packs/Day From To Years       3.0      Smokeless Tobacco Use  Current      Tobacco Comments  Quit smoking in Dec 2024              Alcohol History       Alcohol Use Status  Yes Comment  Socially              Drug Use       Drug Use Status  Yes Types  Marijuana (Weed) Frequency   7 times/week              Sexual Activity       Sexually Active  Yes Partners  Female                    Family History     Family History   Problem Relation Age of Onset    No Known Problems Mother     High Cholesterol Father     Diabetes Father     Hypertension Father     Cancer Maternal Grandfather         Prostate    Heart Attack Paternal Grandfather     High Cholesterol Other      Diabetes Other     Hypertension Other        Review of Systems   Review of Systems  Pertinent review of systems items discussed above.  All others systems items not discussed above were negative.    Physical Exam   BP 136/88   Wt 92.9 kg (204 lb 12.8 oz)   LMP 05/21/2024 (Exact Date)  BMI 40.00 kg/m     Physical Exam    CV- RRR  Resp- bil CTA    Labs    No results found for this or any previous visit (from the past 24 hours).     Imaging/Diagnostics Last 24 Hours   No results found.    Assessment    ascus can't r/o hgsil    Plan   Recommend LEEP.  I discussed the technique, recovery, and risks with patient.  They include but are not limited to bleeding, infection, damage to internal organs (e.g., bladder, bowel, ureters).  Patient voiced understanding and agrees to proceed.  35 min >50% of time was spent discussing findings, management, and treatment options.      Consultations Ordered:  None    Electronically signed by Lamar Deward Canning, MD on 05/25/24 at 11:35 AM EDT

## 2024-05-26 NOTE — Progress Notes (Signed)
 [facility] HOSPITAL PRE-OPERATIVE INSTRUCTIONS    Day of Procedure:   06/09/24             Arrival time: 0730     Surgery time: 0900    Take the following medications with a sip of water:  Follow your MD/Surgeons pre-procedure instructions regarding your medications     Do not eat or drink anything after 12:00 midnight prior to your surgery.  This includes water, chewing gum, mints and ice chips.   You may brush your teeth and gargle the morning of your surgery, but do not swallow the water.     Please see your family doctor/pediatrician for a history and physical and/or concerning medications.   Bring any test results/reports from your physicians office.   If you are under the care of a heart doctor or specialist doctor, please be aware that you may be asked to see them for clearance.    You may be asked to stop blood thinners such as Coumadin, Plavix, Fragmin, Lovenox, etc., or any anti-inflammatories such as:  Aspirin, Ibuprofen , Advil , Naproxen  prior to your surgery.    We also ask that you stop any over the counter medications such as fish oil, vitamin E, glucosamine, garlic, Multivitamins, COQ 10, etc.    We ask that you do not smoke 24 hours prior to surgery.  We ask that you do not  drink any alcoholic beverages 24 hours prior to surgery     You must make arrangements for a responsible adult to take you home after your surgery.    For your safety, you will not be allowed to leave alone or drive yourself home.  Your surgery will be cancelled if you do not have a ride home.     Also for your safety, you must have someone stay with you the first 24 hours after your surgery.     A parent or legal guardian must accompany a child scheduled for surgery and plan to stay at the hospital until the child is discharged.    Please do not bring other children with you.    For your comfort, please wear simple loose fitting clothing to the hospital.  Please do not bring valuables.    Do not wear any make-up on face or  nail polish on your fingers or toes.      For your safety, please do not wear any jewelry or body piercing's on the day of surgery.   All jewelry must be removed.      If you have dentures, they will be removed before going to operating room.    For your convenience, we will provide you with a container.    If you wear contact lenses or glasses, they will be removed, please bring a case for them.     If you have a living will and a durable power of attorney for healthcare, please bring in a copy.     As part of our patient safety program to minimize surgical site infections, we ask you to do the following:    Please notify your surgeon if you develop any illness between         now and the  day of your surgery.    This includes a cough, cold, fever, sore throat, nausea,         or vomiting, and diarrhea, etc.   Please notify your surgeon if you experience dizziness, shortness  of breath or blurred vision between now and the time of your surgery.      Do not shave your operative site 96 hours prior to surgery.   For face and neck surgery, men may use an electric razor 48 hours   prior to surgery.    You must shower the night before surgery and the morning of   your surgery with an antibacterial soap.    You will need to bring a photo ID and insurance card.    [facility] has an Medical laboratory scientific officer, would you like to utilize our pharmacy.     If you will be staying overnight and use a C-pap machine, please bring   your C-pap to hospital.     Our goal is to provide you with excellent care, therefore, visitors will be limited to two in the room at a time so that we may focus on providing this care for you.          Please contact pre-admission testing if you have any further questions.                 [facility] phone number:  [phone]     [facility] PAT fax number:  [phone]  Please note these are generalized instructions for all surgical cases, you may be provided with more specific instructions according to your  surgery.    C-Difficile admission screening and protocol:       * Admitted with diarrhea?                         []  YES    [x]   NO     *Prior history of C-Diff. In last 3 months? []  YES    [x]   NO     *Antibiotic use in the past 6-8 weeks?      [x]   NO    []   YES                 If yes, which ANTIBIOTIC AND REASON______     *Prior hospitalization or nursing home in the last month? []   YES    [x]   NO        SAFETY FIRST...call before you fall!!!

## 2024-06-02 ENCOUNTER — Encounter

## 2024-06-02 MED ORDER — LOSARTAN POTASSIUM-HCTZ 50-12.5 MG PO TABS
50-12.5 | ORAL_TABLET | Freq: Every day | ORAL | 0 refills | 60.00000 days | Status: AC
Start: 2024-06-02 — End: ?

## 2024-06-02 NOTE — Telephone Encounter (Signed)
 Please call pt to schedule appt. Due in August for fu w/ caroline

## 2024-06-02 NOTE — Telephone Encounter (Signed)
 error

## 2024-06-03 NOTE — Telephone Encounter (Signed)
 Jun 13, 2024 at 12:40

## 2024-06-03 NOTE — Telephone Encounter (Signed)
 Please call to schedule

## 2024-06-09 ENCOUNTER — Inpatient Hospital Stay: Payer: Medicaid (Managed Care)

## 2024-06-09 LAB — POC PREGNANCY UR-QUAL: Pregnancy, Urine: NEGATIVE

## 2024-06-09 MED ORDER — NORMAL SALINE FLUSH 0.9 % IV SOLN
0.9 | INTRAVENOUS | Status: DC | PRN
Start: 2024-06-09 — End: 2024-06-09

## 2024-06-09 MED ORDER — ONDANSETRON HCL 4 MG/2ML IJ SOLN
4 | Freq: Once | INTRAMUSCULAR | Status: DC | PRN
Start: 2024-06-09 — End: 2024-06-09
  Administered 2024-06-09: 13:00:00 4 via INTRAVENOUS

## 2024-06-09 MED ORDER — SODIUM CHLORIDE 0.9 % IR SOLN
0.9 | Status: DC | PRN
Start: 2024-06-09 — End: 2024-06-09
  Administered 2024-06-09: 13:00:00 1000

## 2024-06-09 MED ORDER — ACETAMINOPHEN 500 MG PO TABS
500 | Freq: Once | ORAL | Status: AC
Start: 2024-06-09 — End: 2024-06-09
  Administered 2024-06-09: 12:00:00 1000 mg via ORAL

## 2024-06-09 MED ORDER — LIDOCAINE-EPINEPHRINE 1 %-1:100000 IJ SOLN
1 | Freq: Once | INTRAMUSCULAR | Status: DC | PRN
Start: 2024-06-09 — End: 2024-06-09
  Administered 2024-06-09: 14:00:00 9 via INTRADERMAL

## 2024-06-09 MED ORDER — ONDANSETRON HCL 4 MG/2ML IJ SOLN
4 | INTRAMUSCULAR | Status: AC
Start: 2024-06-09 — End: ?

## 2024-06-09 MED ORDER — OXYCODONE-ACETAMINOPHEN 5-325 MG PO TABS
5-325 | ORAL_TABLET | Freq: Four times a day (QID) | ORAL | 0 refills | 7.00000 days | Status: AC | PRN
Start: 2024-06-09 — End: 2024-06-16
  Filled 2024-06-09: qty 28, 7d supply, fill #0

## 2024-06-09 MED ORDER — DEXAMETHASONE SODIUM PHOSPHATE 20 MG/5ML IJ SOLN
20 | Freq: Once | INTRAMUSCULAR | Status: DC | PRN
Start: 2024-06-09 — End: 2024-06-09
  Administered 2024-06-09: 13:00:00 8 via INTRAVENOUS

## 2024-06-09 MED ORDER — SODIUM CHLORIDE 0.9 % IV SOLN
0.9 | INTRAVENOUS | Status: DC | PRN
Start: 2024-06-09 — End: 2024-06-09
  Administered 2024-06-09: 13:00:00 via INTRAVENOUS

## 2024-06-09 MED ORDER — NORMAL SALINE FLUSH 0.9 % IV SOLN
0.9 | Freq: Two times a day (BID) | INTRAVENOUS | Status: DC
Start: 2024-06-09 — End: 2024-06-09

## 2024-06-09 MED ORDER — LIDOCAINE HCL (PF) 2 % IJ SOLN
2 | INTRAMUSCULAR | Status: AC
Start: 2024-06-09 — End: ?

## 2024-06-09 MED ORDER — PROPOFOL 200 MG/20ML IV EMUL
200 | INTRAVENOUS | Status: AC
Start: 2024-06-09 — End: ?

## 2024-06-09 MED ORDER — MIDAZOLAM HCL 2 MG/2ML IJ SOLN
2 | INTRAMUSCULAR | Status: AC
Start: 2024-06-09 — End: ?

## 2024-06-09 MED ORDER — PROPOFOL 200 MG/20ML IV EMUL
200 | Freq: Once | INTRAVENOUS | Status: DC | PRN
Start: 2024-06-09 — End: 2024-06-09
  Administered 2024-06-09: 13:00:00 200 via INTRAVENOUS
  Administered 2024-06-09 (×2): 50 via INTRAVENOUS

## 2024-06-09 MED ORDER — SODIUM CHLORIDE 0.9 % IV SOLN
0.9 | INTRAVENOUS | Status: DC | PRN
Start: 2024-06-09 — End: 2024-06-09

## 2024-06-09 MED ORDER — IODINE STRONG 5 % PO SOLN
5 | Freq: Once | ORAL | Status: DC | PRN
Start: 2024-06-09 — End: 2024-06-09
  Administered 2024-06-09: 13:00:00 10 via ORAL

## 2024-06-09 MED ORDER — MONSELS FERRIC SUBSULFATE EX SOLN
Freq: Once | CUTANEOUS | Status: DC | PRN
Start: 2024-06-09 — End: 2024-06-09
  Administered 2024-06-09: 13:00:00 30 via TOPICAL

## 2024-06-09 MED ORDER — HYDROMORPHONE 0.5MG/0.5ML IJ SOLN
1 | Status: DC | PRN
Start: 2024-06-09 — End: 2024-06-09

## 2024-06-09 MED ORDER — FENTANYL CITRATE (PF) 100 MCG/2ML IJ SOLN
100 | INTRAMUSCULAR | Status: DC | PRN
Start: 2024-06-09 — End: 2024-06-09

## 2024-06-09 MED ORDER — MIDAZOLAM HCL 2 MG/2ML IJ SOLN
2 | Freq: Once | INTRAMUSCULAR | Status: DC | PRN
Start: 2024-06-09 — End: 2024-06-09
  Administered 2024-06-09: 13:00:00 2 via INTRAVENOUS

## 2024-06-09 MED ORDER — ONDANSETRON HCL 4 MG/2ML IJ SOLN
4 | Freq: Once | INTRAMUSCULAR | Status: AC | PRN
Start: 2024-06-09 — End: 2024-06-09
  Administered 2024-06-09: 14:00:00 4 mg via INTRAVENOUS

## 2024-06-09 MED ORDER — FENTANYL CITRATE (PF) 100 MCG/2ML IJ SOLN
100 | INTRAMUSCULAR | Status: AC
Start: 2024-06-09 — End: ?

## 2024-06-09 MED ORDER — SODIUM CHLORIDE (PF) 0.9 % IJ SOLN
0.9 | INTRAMUSCULAR | Status: DC | PRN
Start: 2024-06-09 — End: 2024-06-09

## 2024-06-09 MED ORDER — LIDOCAINE HCL (PF) 2 % IJ SOLN
2 | Freq: Once | INTRAMUSCULAR | Status: DC | PRN
Start: 2024-06-09 — End: 2024-06-09
  Administered 2024-06-09: 13:00:00 100 via INTRAVENOUS

## 2024-06-09 MED ORDER — LACTATED RINGERS IV SOLN
INTRAVENOUS | Status: DC
Start: 2024-06-09 — End: 2024-06-09

## 2024-06-09 MED ORDER — FENTANYL CITRATE (PF) 100 MCG/2ML IJ SOLN
100 | Freq: Once | INTRAMUSCULAR | Status: DC | PRN
Start: 2024-06-09 — End: 2024-06-09
  Administered 2024-06-09 (×2): 50 via INTRAVENOUS

## 2024-06-09 MED ORDER — LIDOCAINE-EPINEPHRINE 1 %-1:100000 IJ SOLN
1 | INTRAMUSCULAR | Status: AC
Start: 2024-06-09 — End: ?

## 2024-06-09 MED ORDER — DEXAMETHASONE SODIUM PHOSPHATE 20 MG/5ML IJ SOLN
20 | INTRAMUSCULAR | Status: AC
Start: 2024-06-09 — End: ?

## 2024-06-09 MED FILL — ACETAMINOPHEN EXTRA STRENGTH 500 MG PO TABS: 500 mg | ORAL | Qty: 2 | Fill #0

## 2024-06-09 MED FILL — DEXAMETHASONE SODIUM PHOSPHATE 20 MG/5ML IJ SOLN: 20 MG/5ML | INTRAMUSCULAR | Qty: 5 | Fill #0

## 2024-06-09 MED FILL — XYLOCAINE/EPINEPHRINE 1 %-1:100000 IJ SOLN: 1 %-:00000 | INTRAMUSCULAR | Qty: 20 | Fill #0

## 2024-06-09 MED FILL — ONDANSETRON HCL 4 MG/2ML IJ SOLN: 4 MG/2ML | INTRAMUSCULAR | Qty: 2 | Fill #0

## 2024-06-09 MED FILL — FENTANYL CITRATE (PF) 100 MCG/2ML IJ SOLN: 100 MCG/2ML | INTRAMUSCULAR | Qty: 2 | Fill #0

## 2024-06-09 MED FILL — MIDAZOLAM HCL 2 MG/2ML IJ SOLN: 2 mg/mL | INTRAMUSCULAR | Qty: 2 | Fill #0

## 2024-06-09 MED FILL — DIPRIVAN 200 MG/20ML IV EMUL: 200 MG/20ML | INTRAVENOUS | Qty: 20 | Fill #0

## 2024-06-09 MED FILL — XYLOCAINE-MPF 2 % IJ SOLN: 2 % | INTRAMUSCULAR | Qty: 5 | Fill #0

## 2024-06-09 NOTE — Discharge Instructions (Signed)
 1 No tampons, douching, or intercourse for 4 weeks.  2 You may go up and down steps.  3 You may take a shower or take a bath.  4 Limit heavy lifting, nothing heavier than the equivalent of a gallon of milk.  5 No driving while taking pain medicine.  6 Call the office for a follow up appointment.  7 You will need to be seen in the office within 3-4 weeks

## 2024-06-09 NOTE — Brief Op Note (Signed)
 Brief Postoperative Note      Patient: Katie Hopkins  Date of Birth: 1996/04/21  MRN: [id number]    Date of Procedure: July 03, 2024    Pre-Op Diagnosis Codes:      * Pap smear of cervix with ASCUS, cannot exclude HGSIL [R87.611]    Post-Op Diagnosis: Same       Procedure(s):  LOOP ELECTROSURGICAL EXCISION PROCEDURE    Surgeon(s):  Dirck Butch, Lamar Mt, MD    Assistant:  Surgical Assistant: Earl Savant    Anesthesia: General    Estimated Blood Loss (mL): less than 50     Complications: None    Specimens:   ID Type Source Tests Collected by Time Destination   A : A) ENDOCERVICAL CURRETTINGS Tissue Tissue SURGICAL PATHOLOGY Jannine Lamar Mt, MD 07-03-24 0914    B : B) LEEP Tissue Tissue SURGICAL PATHOLOGY Jannine Lamar Mt, MD 2024-07-03 0915        Implants:  * No implants in log *      Drains: * No LDAs found *    Findings:  Present At Time Of Surgery (PATOS) (choose all levels that have infection present):  No infection present  Other Findings: NORMAL ANATOMY    Electronically signed by Lamar Mt Jannine, MD on 03-Jul-2024 at 9:19 AM

## 2024-06-09 NOTE — Anesthesia Pre-Procedure Evaluation (Signed)
 Department of Anesthesiology  Preprocedure Note       Name:  Katie Hopkins   Age:  28 y.o.  DOB:  1996/06/03                                          MRN:  [id number]         Date:  06/09/2024      Surgeon: Clotilde):  Flick, Lamar Mt, MD    Procedure: Procedure(s):  LOOP ELECTROSURGICAL EXCISION PROCEDURE    Medications prior to admission:   Prior to Admission medications    Medication Sig Start Date End Date Taking? Authorizing Provider   losartan -hydroCHLOROthiazide  (HYZAAR) 50-12.5 MG per tablet Take 1 tablet by mouth daily 06/02/24  Yes Billow, Allysia R, APRN - CNP   amLODIPine  (NORVASC ) 5 MG tablet TAKE 1 TABLET BY MOUTH DAILY  Patient not taking: Reported on 05/26/2024 06/25/23   Penni Fitch, PA       Current medications:    Current Facility-Administered Medications   Medication Dose Route Frequency Provider Last Rate Last Admin    sodium chloride  flush 0.9 % injection 5-40 mL  5-40 mL IntraVENous 2 times per day Lanny Muller, MD        sodium chloride  flush 0.9 % injection 5-40 mL  5-40 mL IntraVENous PRN Lanny Muller, MD        0.9 % sodium chloride  infusion   IntraVENous PRN Lanny Muller, MD        lactated ringers  infusion   IntraVENous Continuous Flick, Lamar Mt, MD        acetaminophen  (TYLENOL ) tablet 1,000 mg  1,000 mg Oral Once Flick, Robert Paul, MD           Allergies:    Allergies   Allergen Reactions    Amoxicillin Hives    Benadryl [Diphenhydramine] Hives    Tomato Itching and Rash       Problem List:    Patient Active Problem List   Diagnosis Code    Right ovarian cyst N83.201    Ovarian cyst, left N83.202    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, 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    S/P laparotomy Z98.890    Pap smear of cervix with ASCUS, cannot exclude HGSIL R87.611       Past Medical History:        Diagnosis Date    Anxiety and depression     Hypertension     Wears glasses        Past Surgical History:        Procedure Laterality Date    LAPAROTOMY Bilateral 03/10/2024    LAPAROTOMY RESCTION OF BILATERAL  DERMOIDS performed by Jannine Lamar Mt, MD at [facility] OR       Social History:    Social History     Tobacco Use    Smoking status: Former     Types: Cigarettes    Smokeless tobacco: Never    Tobacco comments:     Quit smoking in Dec 2024   Substance Use Topics    Alcohol use: Yes     Comment: Socially                                Counseling given: Not Answered  Tobacco comments: Quit smoking in Dec 2024      Vital  Signs (Current):   Vitals:    05/26/24 1449 06/09/24 0749   Weight: 89.8 kg (198 lb) 89 kg (196 lb 3.4 oz)   Height: 1.524 m (5') 1.524 m (5')                                              BP Readings from Last 3 Encounters:   05/25/24 136/88   04/29/24 122/86   04/08/24 124/82       NPO Status: Time of last liquid consumption: 2300                        Time of last solid consumption: 2300                        Date of last liquid consumption: 06/08/24                        Date of last solid food consumption: 06/08/24    BMI:   Wt Readings from Last 3 Encounters:   06/09/24 89 kg (196 lb 3.4 oz)   05/25/24 92.9 kg (204 lb 12.8 oz)   04/29/24 90.9 kg (200 lb 6.4 oz)     Body mass index is 38.32 kg/m.    CBC:   Lab Results   Component Value Date/Time    WBC 13.6 03/11/2024 05:05 AM    RBC 3.84 03/11/2024 05:05 AM    HGB 12.2 03/11/2024 05:05 AM    HCT 34.8 03/11/2024 05:05 AM    MCV 90.8 03/11/2024 05:05 AM    RDW 13.6 03/11/2024 05:05 AM    PLT 204 03/11/2024 05:05 AM       CMP:   Lab Results   Component Value Date/Time    NA 138 12/18/2023 11:45 AM    K 3.5 12/18/2023 11:45 AM    K 3.8 06/06/2023 10:00 AM    CL 95 12/18/2023 11:45 AM    CO2 32 12/18/2023 11:45 AM    BUN 8 12/18/2023 11:45 AM    CREATININE 0.9 12/18/2023 11:45 AM    AGRATIO 1.5 12/04/2023 11:46 AM    LABGLOM 89 12/18/2023 11:45 AM    LABGLOM >60 10/16/2022 10:20 AM    GLUCOSE 88 12/18/2023 11:45 AM    CALCIUM 10.3 12/18/2023 11:45 AM    BILITOT <0.2 12/04/2023 11:46 AM    ALKPHOS 83 12/04/2023 11:46 AM    AST 23 12/04/2023 11:46 AM     ALT 23 12/04/2023 11:46 AM       POC Tests: No results for input(s): POCGLU, POCNA, POCK, POCCL, POCBUN, POCHEMO, POCHCT in the last 72 hours.    Coags: No results found for: PROTIME, INR, APTT    HCG (If Applicable):   Lab Results   Component Value Date    PREGTESTUR Negative 03/10/2024    PREGSERUM Negative 05/20/2022        ABGs: No results found for: PHART, PO2ART, PCO2ART, HCO3ART, BEART, O2SATART     Type & Screen (If Applicable):  No results found for: ABORH, LABANTI    Drug/Infectious Status (If Applicable):  Lab Results   Component Value Date/Time    HIV Non-Reactive 12/04/2023 11:46 AM       COVID-19 Screening (If Applicable):   Lab Results   Component Value Date/Time  COVID19 Not Detected 10/09/2022 11:39 AM           Anesthesia Evaluation  Patient summary reviewed   no history of anesthetic complications:   Airway: Mallampati: II  TM distance: >3 FB   Neck ROM: full  Mouth opening: > = 3 FB   Dental:    (+) caps      Pulmonary: breath sounds clear to auscultation  (+)           current smoker    (-) COPD, asthma, shortness of breath, recent URI and sleep apnea          Patient smoked on day of surgery.                 Cardiovascular:    (+) hypertension:    (-) valvular problems/murmurs, past MI, CAD, CABG/stent, dysrhythmias,  angina,  CHF and no pulmonary hypertension      Rhythm: regular  Rate: normal                    Neuro/Psych:   (+) psychiatric history:depression/anxiety    (-) seizures, neuromuscular disease, TIA and CVA           GI/Hepatic/Renal:        (-) GERD, PUD, hepatitis, liver disease, no renal disease and bowel prep       Endo/Other:        (-) diabetes mellitus, hypothyroidism, hyperthyroidism, blood dyscrasia, arthritis               Abdominal:             Vascular:          Other Findings:             Anesthesia Plan      general     ASA 2       Induction: intravenous.    MIPS: Prophylactic antiemetics administered.  Anesthetic plan and  risks discussed with patient.      Plan discussed with CRNA.                    Powell Arlean Schaffer, MD   06/09/2024

## 2024-06-09 NOTE — Progress Notes (Signed)
 CLINICAL PHARMACY NOTE: MEDS TO BEDS    Total # of Prescriptions Filled: 1   The following medications were delivered to the patient:  Discharge Medication List as of 06/09/2024  9:50 AM        START taking these medications    Details   oxyCODONE -acetaminophen  (PERCOCET) 5-325 MG per tablet Take 1 tablet by mouth every 6 hours as needed for Pain for up to 7 days. Intended supply: 7 days. Take lowest dose possible to manage pain Max Daily Amount: 4 tablets, Disp-28 tablet, R-0Print               Additional Documentation:

## 2024-06-09 NOTE — Progress Notes (Signed)
 Pt tolerates PO. Denies pain. VSS. Up to chair. Pt will dress in a few minutes. Waiting for wife to arrive.

## 2024-06-09 NOTE — Progress Notes (Signed)
 Vaginal Sweep Documentation     Vaginal prep sponge count performed by Hedy OBIE armin Abram, SA. Count correct.   Vaginal sweep performed by Dr, Jannine at [phone]. No foreign objects or vaginal tears noted.

## 2024-06-09 NOTE — Anesthesia Post-Procedure Evaluation (Signed)
 Department of Anesthesiology  Postprocedure Note    Patient: Katie Hopkins  MRN: [id number]  Birthdate: 1996-07-02  Date of evaluation: 06/09/2024    Procedure Summary       Date: 06/09/24 Room / Location: [facility] OR 02 / [facility]    Anesthesia Start: 0845 Anesthesia Stop: 0929    Procedure: LOOP ELECTROSURGICAL EXCISION PROCEDURE (Vagina ) Diagnosis:       Pap smear of cervix with ASCUS, cannot exclude HGSIL      (Pap smear of cervix with ASCUS, cannot exclude HGSIL [R87.611])    Surgeons: Jannine Lamar Mt, MD Responsible Provider: Melany Powell Dragon, MD    Anesthesia Type: general ASA Status: 2            Anesthesia Type: No value filed.    Aldrete Phase I: Aldrete Score: 10    Aldrete Phase II: Aldrete Score: 10    Anesthesia Post Evaluation    Patient location during evaluation: PACU  Patient participation: complete - patient participated  Level of consciousness: awake and alert  Airway patency: patent  Nausea & Vomiting: no nausea and no vomiting  Cardiovascular status: hemodynamically stable  Respiratory status: acceptable  Hydration status: stable  Pain management: adequate    No notable events documented.

## 2024-06-09 NOTE — Progress Notes (Signed)
 PACU Transfer to SDS    Vitals:    06/09/24 0945   BP: 118/78   Pulse: 85   Resp: 21   Temp: 98.1 F (36.7 C)   SpO2: 100%         Intake/Output Summary (Last 24 hours) at 06/09/2024 0949  Last data filed at 06/09/2024 9071  Gross per 24 hour   Intake 500 ml   Output 20 ml   Net 480 ml       Pain assessment:  none  Pain Level: 0    Patient transferred to care of SDS RN.    06/09/2024 9:49 AM

## 2024-06-09 NOTE — H&P (Signed)
 Date of Surgery Update:  Katie Hopkins was seen, history and physical examination reviewed, and patient examined by me today. There have been no significant clinical changes since the completion of the previous history and physical.    The risk, benefits, and alternatives of the proposed procedure have been explained to the patient (or appropriate guardian) and understanding verbalized. All questions answered. Patient wishes to proceed.    Electronically signed by: Lamar Deward Canning, MD,06/09/2024,8:20 AM

## 2024-06-09 NOTE — Progress Notes (Signed)
 Patient admitted to PACU # 9 from OR at [phone] post loop electrosurgical excision procedure per Dr. Jannine.  Attached to PACU monitoring system and report received from anesthesia provider.  Patient was reported to be hemodynamically stable during procedure.  Patient unresponsive d/t level of sedation. Pt shows no signs of pain.

## 2024-06-09 NOTE — Progress Notes (Signed)
 Pt awake on arrival to phase II. Denies pain at present. VSS. HOB up. Given snack and call light. Wife is on her way to hospital. Prescription sent to retail pharmacy per pt request. Spoke to [facility] in pharmacy.

## 2024-06-09 NOTE — Op Note (Signed)
 [facility]          [address] [place], [place] [address]                            OPERATIVE REPORT      PATIENT NAME: Katie Hopkins, Katie Hopkins              DOB: [birth year]  MED REC NO: [id number]                      ROOM: OR  ACCOUNT NO: [id number]                       ADMIT DATE: 06/09/2024  PROVIDER: Lamar Deward Canning, MD      DATE OF PROCEDURE:  06/09/2024    SURGEON:  Lamar Deward Canning, MD    INDICATIONS:  The patient is a 28 year old female.  She is gravida 1, para 1.  She presents with ASCUS, cannot rule out high-grade lesion.  She had this Pap smear as well last year with a negative colposcopy.  I recommend a LEEP procedure for the patient.  I discussed the technique, the recovery, and the risks.  They include, but are not limited to, bleeding, infection, damage to her internal organs such as bladder and bowel.  The patient voiced understanding and agreed to proceed with the surgery.    PREOPERATIVE DIAGNOSIS:  Atypical squamous cells of undetermined significance, cannot rule out high-grade squamous intraepithelial lesion.    POSTOPERATIVE DIAGNOSIS:  Atypical squamous cells of undetermined significance, cannot rule out high-grade squamous intraepithelial lesion.    PROCEDURE:  Loop electrosurgical excision procedure.    ANESTHESIA:  General endotracheal anesthetic.    ESTIMATED BLOOD LOSS:  Less than 50 mL.    PATHOLOGY:  LEEP specimen, endocervical curetting.    DRAINS:  None.    COMPLICATIONS:  None.    DISPOSITION:  Stable to recovery room.    DESCRIPTION OF PROCEDURE:  The patient was taken to the operating room, was prepped and draped in normal sterile fashion in the dorsal lithotomy position.  A coated speculum was placed in the patient's vagina.  Cervix was identified.  Cervix was painted with Lugol solution.  Transformation zone was identified.  Cervix was injected with 1% lidocaine  with epinephrine  solution.  LEEP was performed across the transformation  zone using 50 watts of cutting current.  Curettage was performed of the endocervical canal first using the curette and then the Cytobrush.  The crater of the cervix was then made hemostatic using 50 watts of coag current, and then the crater of the cervix was painted with a Monsel's solution to help ensure hemostasis.  Good hemostasis was noted.  All instruments were removed from the patient's pelvic organs.  She was taken out of lithotomy and allowed to awaken from anesthesia, after which time she was transferred from the operating room to the recovery room where she went in stable condition.  All sponge, lap, and needles were correct x2.          LAMAR DEWARD CANNING, MD      D:  06/09/2024 09:24:59     T:  06/09/2024 09:48:52     RPF/AQS  Job #:  [id number]     Doc#:  [id number]

## 2024-06-09 NOTE — Progress Notes (Signed)
 Pt is dressed. Wife has arrived. Discharge instructions given to pt and wife by Izetta SQUIBB RN. Pt walked with wife to discharge.

## 2024-06-20 ENCOUNTER — Ambulatory Visit: Admit: 2024-06-20 | Discharge: 2024-06-20 | Payer: Medicaid (Managed Care)

## 2024-06-20 VITALS — BP 130/84 | HR 74 | Resp 22 | Ht 60.0 in | Wt 197.8 lb

## 2024-06-20 DIAGNOSIS — I10 Essential (primary) hypertension: Principal | ICD-10-CM

## 2024-06-20 NOTE — Progress Notes (Signed)
 SUBJECTIVE/OBJECTIVE:  Katie Hopkins (DOB:  May 10, 1996) is a 28 y.o. female here for   Chief Complaint: Follow-up      HPI  History of Present Illness  28 year old female presents for 61-month hypertension follow-up.    HTN-- BP well controlled today.  Managing well with losartan  hydrochlorothiazide . Not taking amlodipine  for 3 weeks due to lack of refills. No chest pain, dyspnea, dizziness, lightheadedness, syncope, or edema.    Abnormal Pap smear led to LEEP procedure on 06/09/2024.  Recovering well.      Review of Systems   A comprehensive review of systems was negative except for what was noted in the HPI.     Vitals:    06/20/24 1056   BP: 130/84   Pulse: 74   Resp: 22   SpO2: 98%        Physical Exam  Physical Exam  General Appearance: Normal.  Vital signs: Within normal limits.  HEENT: Within normal limits.  Respiratory: Clear to auscultation, no wheezing, rales, or rhonchi.  Cardiovascular: Regular rate and rhythm, no murmurs, rubs, or gallops.  Skin: Warm and dry, no rash.  Neurological: Normal.      ASSESSMENT/PLAN:  1. Primary hypertension     Assessment & Plan  Hypertension:  - BP well-controlled at 130/84 mmHg  - Off amlodipine  since 06/02/2024, managing with losartan /hydrochlorothiazide   - Discontinue amlodipine , continue losartan /hydrochlorothiazide     Health Maintenance:  - Advised influenza and COVID-19 vaccines at pharmacy    Follow-up:  - Scheduled in 6 months, additional labs to be conducted        Return in about 6 months (around 12/21/2024) for Annual Physical. OR sooner with questions, concerns, worsening symptoms  Patient verbalized understanding and agreement to plan.      The patient (or guardian, if applicable) and other individuals in attendance with the patient were advised that Artificial Intelligence will be utilized during this visit to record, process the conversation to generate a clinical note, and support improvement of the AI technology. The patient (or guardian, if  applicable) and other individuals in attendance at the appointment consented to the use of AI, including the recording.      Documentation was done using voice recognition dragon software. Every effort was made to ensure accuracy; however, inadvertent unintentional computerized transcription errors may be present.     An electronic signature was used to authenticate this note.      --Aleck Early, PA

## 2024-12-15 ENCOUNTER — Encounter

## 2024-12-15 ENCOUNTER — Ambulatory Visit: Admit: 2024-12-15 | Discharge: 2024-12-15 | Payer: Medicaid (Managed Care)

## 2024-12-15 NOTE — Progress Notes (Signed)
 SUBJECTIVE/OBJECTIVE:  Katie Hopkins (DOB:  01-30-1996) is a 29 y.o. female here for   Chief Complaint: Annual Exam      HPI  History of Present Illness  The patient is a 29 year old female who presents today for an annual exam.    Weight management-- She is trying to lose weight. She has a history of eating disorders and finds it challenging to maintain a 1200-calorie diet. She recalls a period of over 24 hours without food intake, which she attributes to her coping mechanism for depression. She has scheduled an appointment with a dietitian to address these issues. She has abstained from alcohol since 10/2024 and has been smoke-free for approximately one year, having previously consumed two packs of cigarettes per week.    HTN--She discontinued her antihypertensive medication following her last consultation but resumed it last week due to a significant increase in her blood pressure readings at work. This is the second time she has done this. Frequent urination is reported as a side effect of the medication. She is not experiencing any chest pain or trouble breathing. She is currently on a regimen of losartan -hydrochlorothiazide  combination therapy.    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 She is interested in consulting a dermatologist for this issue.      Review of Systems   A comprehensive review of systems was negative except for what was noted in the HPI.     Vitals:    12/15/24 1131   BP: 132/86   Pulse:    Resp:    SpO2:         Physical Exam  Physical Exam  General Appearance: Normal.  Vital signs: Within normal limits.  HEENT: Within normal limits.  Respiratory: Clear to auscultation, no wheezing, rales or rhonchi.  Cardiovascular: Regular rate and rhythm, no murmurs, rubs, or gallops.  Gastrointestinal: Soft, no tenderness, no distention, no masses.  Skin: Warm and dry, no rash.  Neurological: Normal.      ASSESSMENT/PLAN:  1. Annual physical exam  -      Comprehensive Metabolic Panel; Future  -     Lipid Panel; Future  -     Hemoglobin A1C; Future  -     Varicella-Zoster Virus (VZV) Antibodies IgG & IgM; Future  -     [facility] - Onita Countryman MD, MPH, FAAD, Dermatology, [place]     Assessment & Plan  Health maintenance:  - Blood work to be conducted today to assess electrolytes, liver function, kidney function, cholesterol levels, and for diabetic screening    Hypertension:  - Blood pressure well-controlled with current medication regimen of losartan -hydrochlorothiazide  combo pill  - Had stopped medication after last appointment but restarted last week after a high reading at work  - Current blood pressure is 132/86  - Advised to continue current medication and monitor blood pressure regularly  - Recommended lifestyle modifications including reducing salt intake and staying physically active    Weight management:  - On a weight loss journey but struggles to meet calorie goals due to past eating disorders  - Has lost 12 pounds  - Advised to consider less strict calorie deficits and use meal replacement shakes if needed  - Referral to a dietitian made to assist with dietary needs    HS:  - Experiences recurrent cysts under armpits  - Warm compresses recommended when cysts start to form  - Referral to Dr. Onita, a dermatologist, provided for further evaluation and management  Follow-up:  - Patient to follow up in 6 months for a blood pressure check        Return in about 6 months (around 06/14/2025). OR sooner with questions, concerns, worsening symptoms  Patient verbalized understanding and agreement to plan.      The patient (or guardian, if applicable) and other individuals in attendance with the patient were advised that Artificial Intelligence will be utilized during this visit to record, process the conversation to generate a clinical note, and support improvement of the AI technology. The patient (or guardian, if applicable) and other individuals in attendance at  the appointment consented to the use of AI, including the recording.      Documentation was done using voice recognition dragon software. Every effort was made to ensure accuracy; however, inadvertent unintentional computerized transcription errors may be present.     An electronic signature was used to authenticate this note.      --Aleck Early, PA

## 2024-12-16 LAB — LIPID PANEL
Cholesterol, Total: 180 mg/dL (ref 0–199)
HDL: 50 mg/dL (ref 40–60)
LDL Cholesterol: 115 mg/dL — ABNORMAL HIGH (ref <100)
Triglycerides: 74 mg/dL (ref 0–150)
VLDL Cholesterol Calculated: 15 mg/dL

## 2024-12-16 LAB — COMPREHENSIVE METABOLIC PANEL
ALT: 15 U/L (ref 10–40)
AST: 20 U/L (ref 15–37)
Albumin/Globulin Ratio: 1.4 (ref 1.1–2.2)
Albumin: 4.6 g/dL (ref 3.4–5.0)
Alkaline Phosphatase: 74 U/L (ref 40–129)
Anion Gap: 12 (ref 3–16)
BUN: 11 mg/dL (ref 7–20)
CO2: 29 mmol/L (ref 21–32)
Calcium: 10.2 mg/dL (ref 8.3–10.6)
Chloride: 98 mmol/L — ABNORMAL LOW (ref 99–110)
Creatinine: 0.8 mg/dL (ref 0.6–1.1)
Est, Glom Filt Rate: >90
Glucose: 78 mg/dL (ref 70–99)
Potassium: 4.3 mmol/L (ref 3.5–5.1)
Sodium: 139 mmol/L (ref 136–145)
Total Bilirubin: 0.3 mg/dL (ref 0.0–1.0)
Total Protein: 7.9 g/dL (ref 6.4–8.2)

## 2024-12-16 LAB — HEMOGLOBIN A1C
Estimated Avg Glucose: 96.8 mg/dL
Hemoglobin A1C: 5 %
